# Patient Record
Sex: Male | Born: 1940
Health system: Southern US, Community
[De-identification: ages and names within clinical notes are randomized; demographics above are authoritative.]

## PROBLEM LIST (undated history)

## (undated) DIAGNOSIS — Z860100 Personal history of colon polyps, unspecified: Secondary | ICD-10-CM

## (undated) DIAGNOSIS — K5730 Diverticulosis of large intestine without perforation or abscess without bleeding: Secondary | ICD-10-CM

## (undated) DIAGNOSIS — Z973 Presence of spectacles and contact lenses: Secondary | ICD-10-CM

## (undated) DIAGNOSIS — E785 Hyperlipidemia, unspecified: Secondary | ICD-10-CM

## (undated) DIAGNOSIS — I2510 Atherosclerotic heart disease of native coronary artery without angina pectoris: Secondary | ICD-10-CM

## (undated) DIAGNOSIS — C61 Malignant neoplasm of prostate: Secondary | ICD-10-CM

## (undated) DIAGNOSIS — F5221 Male erectile disorder: Secondary | ICD-10-CM

## (undated) DIAGNOSIS — N403 Nodular prostate with lower urinary tract symptoms: Secondary | ICD-10-CM

## (undated) DIAGNOSIS — Z951 Presence of aortocoronary bypass graft: Secondary | ICD-10-CM

## (undated) DIAGNOSIS — I10 Essential (primary) hypertension: Secondary | ICD-10-CM

## (undated) HISTORY — PX: CORONARY ARTERY BYPASS GRAFT: SHX141

## (undated) HISTORY — DX: Male erectile disorder: F52.21

## (undated) HISTORY — DX: Essential (primary) hypertension: I10

## (undated) HISTORY — PX: CARDIAC CATHETERIZATION: SHX172

## (undated) HISTORY — DX: Atherosclerotic heart disease of native coronary artery without angina pectoris: I25.10

---

## 2004-08-27 ENCOUNTER — Ambulatory Visit: Payer: Self-pay | Admitting: Family Medicine

## 2004-09-03 ENCOUNTER — Ambulatory Visit: Payer: Self-pay | Admitting: Family Medicine

## 2004-12-03 ENCOUNTER — Ambulatory Visit: Payer: Self-pay | Admitting: Family Medicine

## 2005-01-07 ENCOUNTER — Ambulatory Visit: Payer: Self-pay | Admitting: Family Medicine

## 2005-04-29 ENCOUNTER — Ambulatory Visit: Payer: Self-pay | Admitting: Family Medicine

## 2005-04-29 ENCOUNTER — Inpatient Hospital Stay (HOSPITAL_COMMUNITY): Admission: EM | Admit: 2005-04-29 | Discharge: 2005-05-09 | Payer: Self-pay | Admitting: *Deleted

## 2005-04-29 ENCOUNTER — Ambulatory Visit: Payer: Self-pay | Admitting: Cardiology

## 2005-05-02 DIAGNOSIS — Z951 Presence of aortocoronary bypass graft: Secondary | ICD-10-CM

## 2005-05-02 HISTORY — DX: Presence of aortocoronary bypass graft: Z95.1

## 2005-05-10 ENCOUNTER — Ambulatory Visit: Payer: Self-pay | Admitting: Cardiology

## 2005-05-16 ENCOUNTER — Ambulatory Visit: Payer: Self-pay | Admitting: Cardiology

## 2005-05-23 ENCOUNTER — Ambulatory Visit: Payer: Self-pay | Admitting: Cardiovascular Disease

## 2005-05-23 ENCOUNTER — Ambulatory Visit: Payer: Self-pay | Admitting: Cardiology

## 2005-06-02 ENCOUNTER — Encounter (HOSPITAL_COMMUNITY): Admission: RE | Admit: 2005-06-02 | Discharge: 2005-08-31 | Payer: Self-pay | Admitting: Cardiovascular Disease

## 2005-07-01 ENCOUNTER — Ambulatory Visit: Payer: Self-pay | Admitting: Cardiovascular Disease

## 2005-11-04 ENCOUNTER — Ambulatory Visit: Payer: Self-pay | Admitting: Cardiovascular Disease

## 2006-02-17 ENCOUNTER — Ambulatory Visit: Payer: Self-pay | Admitting: Family Medicine

## 2006-02-24 ENCOUNTER — Ambulatory Visit: Payer: Self-pay | Admitting: Family Medicine

## 2006-05-26 ENCOUNTER — Ambulatory Visit: Payer: Self-pay | Admitting: Cardiovascular Disease

## 2007-01-26 ENCOUNTER — Ambulatory Visit: Payer: Self-pay | Admitting: Cardiovascular Disease

## 2007-04-06 ENCOUNTER — Ambulatory Visit: Payer: Self-pay | Admitting: Family Medicine

## 2007-04-09 LAB — CONVERTED CEMR LAB
ALT: 30 units/L (ref 0–53)
AST: 28 units/L (ref 0–37)
Albumin: 4.4 g/dL (ref 3.5–5.2)
Alkaline Phosphatase: 55 units/L (ref 39–117)
BUN: 14 mg/dL (ref 6–23)
Basophils Absolute: 0.1 10*3/uL (ref 0.0–0.1)
Basophils Relative: 0.7 % (ref 0.0–1.0)
Bilirubin, Direct: 0.2 mg/dL (ref 0.0–0.3)
CO2: 33 meq/L — ABNORMAL HIGH (ref 19–32)
Calcium: 9.8 mg/dL (ref 8.4–10.5)
Chloride: 101 meq/L (ref 96–112)
Cholesterol: 164 mg/dL (ref 0–200)
Creatinine, Ser: 0.8 mg/dL (ref 0.4–1.5)
Eosinophils Absolute: 0.3 10*3/uL (ref 0.0–0.6)
Eosinophils Relative: 4.4 % (ref 0.0–5.0)
GFR calc Af Amer: 125 mL/min
GFR calc non Af Amer: 103 mL/min
Glucose, Bld: 117 mg/dL — ABNORMAL HIGH (ref 70–99)
HCT: 47.3 % (ref 39.0–52.0)
HDL: 29.7 mg/dL — ABNORMAL LOW (ref >39.0)
Hemoglobin: 16.5 g/dL (ref 13.0–17.0)
LDL Cholesterol: 115 mg/dL — ABNORMAL HIGH (ref 0–99)
Lymphocytes Relative: 20.1 % (ref 12.0–46.0)
MCHC: 34.8 g/dL (ref 30.0–36.0)
MCV: 94.6 fL (ref 78.0–100.0)
Monocytes Absolute: 0.6 10*3/uL (ref 0.2–0.7)
Monocytes Relative: 7.2 % (ref 3.0–11.0)
Neutro Abs: 5.2 10*3/uL (ref 1.4–7.7)
Neutrophils Relative %: 67.6 % (ref 43.0–77.0)
PSA: 0.52 ng/mL (ref 0.10–4.00)
Platelets: 204 10*3/uL (ref 150–400)
Potassium: 3.9 meq/L (ref 3.5–5.1)
RBC: 5 M/uL (ref 4.22–5.81)
RDW: 12.1 % (ref 11.5–14.6)
Sodium: 141 meq/L (ref 135–145)
TSH: 1.79 microintl units/mL (ref 0.35–5.50)
Total Bilirubin: 0.7 mg/dL (ref 0.3–1.2)
Total CHOL/HDL Ratio: 5.5
Total Protein: 7 g/dL (ref 6.0–8.3)
Triglycerides: 98 mg/dL (ref 0–149)
VLDL: 20 mg/dL (ref 0–40)
WBC: 7.7 10*3/uL (ref 4.5–10.5)

## 2007-04-13 ENCOUNTER — Ambulatory Visit: Payer: Self-pay | Admitting: Family Medicine

## 2007-04-13 DIAGNOSIS — E785 Hyperlipidemia, unspecified: Secondary | ICD-10-CM

## 2007-04-13 DIAGNOSIS — I2510 Atherosclerotic heart disease of native coronary artery without angina pectoris: Secondary | ICD-10-CM

## 2007-04-13 DIAGNOSIS — I10 Essential (primary) hypertension: Secondary | ICD-10-CM

## 2007-06-07 LAB — HM COLONOSCOPY

## 2007-07-14 LAB — HM COLONOSCOPY

## 2007-07-20 ENCOUNTER — Ambulatory Visit: Payer: Self-pay | Admitting: Gastroenterology

## 2007-08-03 ENCOUNTER — Encounter: Payer: Self-pay | Admitting: Family Medicine

## 2007-08-03 ENCOUNTER — Encounter: Payer: Self-pay | Admitting: Gastroenterology

## 2007-08-03 ENCOUNTER — Ambulatory Visit: Payer: Self-pay | Admitting: Gastroenterology

## 2007-08-03 HISTORY — PX: COLONOSCOPY: SHX174

## 2008-02-22 ENCOUNTER — Ambulatory Visit: Payer: Self-pay | Admitting: Cardiovascular Disease

## 2008-04-24 ENCOUNTER — Ambulatory Visit: Payer: Self-pay | Admitting: Family Medicine

## 2008-04-24 DIAGNOSIS — N138 Other obstructive and reflux uropathy: Secondary | ICD-10-CM

## 2008-04-24 DIAGNOSIS — F528 Other sexual dysfunction not due to a substance or known physiological condition: Secondary | ICD-10-CM

## 2008-04-24 DIAGNOSIS — N401 Enlarged prostate with lower urinary tract symptoms: Secondary | ICD-10-CM

## 2008-11-07 ENCOUNTER — Encounter (INDEPENDENT_AMBULATORY_CARE_PROVIDER_SITE_OTHER): Payer: Self-pay | Admitting: *Deleted

## 2009-01-08 DIAGNOSIS — E669 Obesity, unspecified: Secondary | ICD-10-CM

## 2009-01-08 DIAGNOSIS — R609 Edema, unspecified: Secondary | ICD-10-CM

## 2009-01-09 ENCOUNTER — Ambulatory Visit: Payer: Self-pay | Admitting: Cardiovascular Disease

## 2009-07-10 ENCOUNTER — Ambulatory Visit: Payer: Self-pay | Admitting: Family Medicine

## 2009-07-10 ENCOUNTER — Encounter: Payer: Self-pay | Admitting: Cardiovascular Disease

## 2009-11-16 ENCOUNTER — Telehealth: Payer: Self-pay | Admitting: Family Medicine

## 2009-11-23 ENCOUNTER — Encounter (INDEPENDENT_AMBULATORY_CARE_PROVIDER_SITE_OTHER): Payer: Self-pay | Admitting: *Deleted

## 2010-02-05 ENCOUNTER — Ambulatory Visit: Payer: Self-pay | Admitting: Cardiovascular Disease

## 2010-02-05 DIAGNOSIS — R109 Unspecified abdominal pain: Secondary | ICD-10-CM

## 2010-06-11 ENCOUNTER — Ambulatory Visit: Admission: RE | Admit: 2010-06-11 | Discharge: 2010-06-11 | Payer: Self-pay | Source: Home / Self Care

## 2010-06-11 ENCOUNTER — Encounter: Payer: Self-pay | Admitting: Cardiovascular Disease

## 2010-07-04 LAB — CONVERTED CEMR LAB
ALT: 33 units/L (ref 0–53)
ALT: 33 units/L (ref 0–53)
AST: 29 units/L (ref 0–37)
AST: 32 units/L (ref 0–37)
Albumin: 4.2 g/dL (ref 3.5–5.2)
Albumin: 4.5 g/dL (ref 3.5–5.2)
Alkaline Phosphatase: 48 units/L (ref 39–117)
Alkaline Phosphatase: 50 units/L (ref 39–117)
BUN: 16 mg/dL (ref 6–23)
BUN: 17 mg/dL (ref 6–23)
Basophils Absolute: 0 10*3/uL (ref 0.0–0.1)
Basophils Absolute: 0 10*3/uL (ref 0.0–0.1)
Basophils Relative: 0.2 % (ref 0.0–3.0)
Basophils Relative: 0.6 % (ref 0.0–3.0)
Bilirubin, Direct: 0.1 mg/dL (ref 0.0–0.3)
Bilirubin, Direct: 0.2 mg/dL (ref 0.0–0.3)
CO2: 30 meq/L (ref 19–32)
CO2: 32 meq/L (ref 19–32)
Calcium: 9.5 mg/dL (ref 8.4–10.5)
Calcium: 9.8 mg/dL (ref 8.4–10.5)
Chloride: 103 meq/L (ref 96–112)
Chloride: 99 meq/L (ref 96–112)
Cholesterol: 125 mg/dL (ref 0–200)
Cholesterol: 151 mg/dL (ref 0–200)
Creatinine, Ser: 0.9 mg/dL (ref 0.4–1.5)
Creatinine, Ser: 1 mg/dL (ref 0.4–1.5)
Eosinophils Absolute: 0.2 10*3/uL (ref 0.0–0.7)
Eosinophils Absolute: 0.3 10*3/uL (ref 0.0–0.7)
Eosinophils Relative: 2.6 % (ref 0.0–5.0)
Eosinophils Relative: 5 % (ref 0.0–5.0)
GFR calc Af Amer: 109 mL/min
GFR calc non Af Amer: 79.28 mL/min (ref >60)
GFR calc non Af Amer: 90 mL/min
Glucose, Bld: 103 mg/dL — ABNORMAL HIGH (ref 70–99)
Glucose, Bld: 109 mg/dL — ABNORMAL HIGH (ref 70–99)
HCT: 45.9 % (ref 39.0–52.0)
HCT: 48.1 % (ref 39.0–52.0)
HDL: 35.1 mg/dL — ABNORMAL LOW (ref >39.0)
HDL: 38.2 mg/dL — ABNORMAL LOW (ref >39.00)
Hemoglobin: 15.4 g/dL (ref 13.0–17.0)
Hemoglobin: 16.5 g/dL (ref 13.0–17.0)
LDL Cholesterol: 71 mg/dL (ref 0–99)
LDL Cholesterol: 92 mg/dL (ref 0–99)
Lymphocytes Relative: 13 % (ref 12.0–46.0)
Lymphocytes Relative: 19.1 % (ref 12.0–46.0)
Lymphs Abs: 1.2 10*3/uL (ref 0.7–4.0)
MCHC: 33.6 g/dL (ref 30.0–36.0)
MCHC: 34.3 g/dL (ref 30.0–36.0)
MCV: 96.3 fL (ref 78.0–100.0)
MCV: 96.4 fL (ref 78.0–100.0)
Monocytes Absolute: 0.5 10*3/uL (ref 0.1–1.0)
Monocytes Absolute: 0.5 10*3/uL (ref 0.1–1.0)
Monocytes Relative: 6.1 % (ref 3.0–12.0)
Monocytes Relative: 6.8 % (ref 3.0–12.0)
Neutro Abs: 4.6 10*3/uL (ref 1.4–7.7)
Neutro Abs: 7 10*3/uL (ref 1.4–7.7)
Neutrophils Relative %: 68.5 % (ref 43.0–77.0)
Neutrophils Relative %: 78.1 % — ABNORMAL HIGH (ref 43.0–77.0)
PSA: 1.65 ng/mL (ref 0.10–4.00)
PSA: 1.93 ng/mL (ref 0.10–4.00)
Platelets: 187 10*3/uL (ref 150.0–400.0)
Platelets: 188 10*3/uL (ref 150–400)
Potassium: 3.6 meq/L (ref 3.5–5.1)
Potassium: 3.7 meq/L (ref 3.5–5.1)
RBC: 4.76 M/uL (ref 4.22–5.81)
RBC: 5 M/uL (ref 4.22–5.81)
RDW: 12.2 % (ref 11.5–14.6)
RDW: 12.4 % (ref 11.5–14.6)
Sodium: 141 meq/L (ref 135–145)
Sodium: 143 meq/L (ref 135–145)
TSH: 1.83 microintl units/mL (ref 0.35–5.50)
TSH: 2.13 microintl units/mL (ref 0.35–5.50)
Total Bilirubin: 0.7 mg/dL (ref 0.3–1.2)
Total Bilirubin: 0.9 mg/dL (ref 0.3–1.2)
Total CHOL/HDL Ratio: 3
Total CHOL/HDL Ratio: 4.3
Total Protein: 7.3 g/dL (ref 6.0–8.3)
Total Protein: 7.4 g/dL (ref 6.0–8.3)
Triglycerides: 119 mg/dL (ref 0–149)
Triglycerides: 79 mg/dL (ref 0.0–149.0)
VLDL: 15.8 mg/dL (ref 0.0–40.0)
VLDL: 24 mg/dL (ref 0–40)
WBC: 6.7 10*3/uL (ref 4.5–10.5)
WBC: 8.9 10*3/uL (ref 4.5–10.5)

## 2010-07-06 NOTE — Progress Notes (Signed)
Summary: Change benicar to cozaar to save pt money  Phone Note From Pharmacy   [facility]  [address] [facility] Summary of Call: [facility] Most [facility] called and has request a sub for Benicar to Cozaar to save the pt some money. Can we do this? Initial call taken by: Romualdo Bolk, CMA Duncan Dull),  November 16, 2009 8:54 AM  Follow-up for Phone Call        switch to Losartan 50 mg once daily , #30 with 11 rf Follow-up by: Nelwyn Salisbury MD,  November 18, 2009 1:00 PM    New/Updated Medications: LOSARTAN POTASSIUM 50 MG TABS (LOSARTAN POTASSIUM) 1 once daily Prescriptions: LOSARTAN POTASSIUM 50 MG TABS (LOSARTAN POTASSIUM) 1 once daily  #30 x 11   Entered by:   Raechel Ache, RN   Authorized by:   Nelwyn Salisbury MD   Signed by:   Raechel Ache, RN on 11/18/2009   Method used:   [facility]        [facility]  [address] (retail)       [address]       [place], [place]  [address]       Ph: [phone] or [phone]       Fax: [phone]   RxID:   [id number]

## 2010-07-06 NOTE — Letter (Signed)
Summary: Appointment - Reminder 2  [facility], Main Office  [address]   [place], [place] [address]   Phone: [phone]  Fax: [phone]     November 23, 2009 MRN: [id number]   Robert [name] [address] [place], [place]  [address]   Dear [name],  Our records indicate that it is time to schedule a follow-up appointment with Dr. Eden Emms in August. It is very important that we reach you to schedule this appointment. We look forward to participating in your health care needs. Please contact us at the number listed above at your earliest convenience to schedule your appointment.  If you are unable to make an appointment at this time, give [place] a call so we can update our records.     Sincerely,   Migdalia Dk [facility] Scheduling Team

## 2010-07-06 NOTE — Assessment & Plan Note (Signed)
Summary: F1Y/DM  Medications Added BENICAR 20 MG TABS (OLMESARTAN MEDOXOMIL) Take one tablet by mouth daily      Allergies Added: NKDA  Visit Type:  1 year follow up  CC:  No cardiac complains.  History of Present Illness: Robert [name] is seen today in followup for his coronary artery disease hypertension and hyperlipidemia.  He is status post coronary artery bypass surgery in 2006.  He has good LV function.  His risk factors are well modified.  He has been compliant with his medications.  He is not having any significant chest pain.  He had a two-week vacation in [place] for his 45th anniversary.  He continues to be somewhat overweight.  We talked about low-carb diet.  He normally gets his EKG and lab work with Dr. Clent Ridges.  Otherwise he's not had any significant chest pain diaphoresis exertional dyspnea PND or orthopnea.  He continues to work 4 days a week.  He has been having some abdominal/lower back pain with prolonged standing.  Given his vascular disease he should have AAA R/O  Current Problems (verified): 1)  Hyperlipidemia  (ICD-272.4) 2)  Hypertension  (ICD-401.9) 3)  Coronary Artery Disease  (ICD-414.00) 4)  Obesity  (ICD-278.00) 5)  Benign Prostatic Hypertrophy, With Obstruction  (ICD-600.01) 6)  Erectile Dysfunction  (ICD-302.72) 7)  Routine General Medical Exam@health  Care Facl  (ICD-V70.0) 8)  Edema  (ICD-782.3)  Current Medications (verified): 1)  Atenolol-Chlorthalidone 50-25 Mg Tabs (Atenolol-Chlorthalidone) .Marland Kitchen.. 1 By Mouth Once Daily 2)  Aspir-Low 81 Mg Tbec (Aspirin) .Marland Kitchen.. 1 Once Daily Pc 3)  Zocor 80 Mg  Tabs (Simvastatin) .... Once Daily 4)  Cialis 20 Mg Tabs (Tadalafil) .... As Needed 5)  Benicar 20 Mg Tabs (Olmesartan Medoxomil) .... Take One Tablet By Mouth Daily  Allergies (verified): No Known Drug Allergies  Past History:  Past Medical History: Last updated: 07/10/2009 Current Problems:  HYPERLIPIDEMIA (ICD-272.4) HYPERTENSION (ICD-401.9) CORONARY ARTERY  DISEASE (ICD-414.00)  sees Dr. Eden Emms OBESITY (ICD-278.00) BENIGN PROSTATIC HYPERTROPHY, WITH OBSTRUCTION (ICD-600.01) ERECTILE DYSFUNCTION (ICD-302.72) EDEMA (ICD-782.3) ED  Past Surgical History: Last updated: 07/10/2009 Triple bypass surgery 11-06 per Dr. Zenaida Niece Trigt colonoscopy 08-03-07 per Dr. Russella Dar, repeat in 10 yrs  LIMA to the LAD, a radial graft to the circ, and the vein  graft to the diagonal.  Family History: Last updated: 04/13/2007 Family History of Sudden Death  Social History: Last updated: 04/13/2007 Married Never Smoked Alcohol use-no Drug use-no  Review of Systems       Denies fever, malais, weight loss, blurry vision, decreased visual acuity, cough, sputum, SOB, hemoptysis, pleuritic pain, palpitaitons, heartburn, abdominal pain, melena, lower extremity edema, claudication, or rash.   Vital Signs:  Patient profile:   70 year old male Height:      67 inches Weight:      218.25 pounds BMI:     34.31 Pulse rate:   68 / minute Pulse rhythm:   regular Resp:     18 per minute BP sitting:   138 / 70  (left arm) Cuff size:   large  Vitals Entered By: Vikki Ports (February 05, 2010 9:50 AM)  Physical Exam  General:  Affect appropriate Healthy:  appears stated age HEENT: normal Neck supple with no adenopathy JVP normal no bruits no thyromegaly Lungs clear with no wheezing and good diaphragmatic motion Heart:  S1/S2 no murmur,rub, gallop or click PMI normal Abdomen: benighn, BS positve, no tenderness, no AAA no bruit.  No HSM or HJR Distal pulses intact with  no bruits No edema Neuro non-focal Skin warm and dry    Impression & Recommendations:  Problem # 1:  HYPERLIPIDEMIA (ICD-272.4) At goal but recent warning on liver toxicity with high dose zocor.  He has not tolerated crestor in the past and wants to stay on zocor He only takes it every other day.  F/U LFT's q 6 months    Zocor 80 Mg Tabs (Simvastatin) ..... Once daily  CHOL: 125  (07/10/2009)   LDL: 71 (07/10/2009)   HDL: 38.20 (07/10/2009)   TG: 79.0 (07/10/2009)  Problem # 2:  HYPERTENSION (ICD-401.9) Well controlled  Has not started generic losartan yet The following medications were removed from the medication list:    Losartan Potassium 50 Mg Tabs (Losartan potassium) .Marland Kitchen... 1 once daily His updated medication list for this problem includes:    Atenolol-chlorthalidone 50-25 Mg Tabs (Atenolol-chlorthalidone) .Marland Kitchen... 1 by mouth once daily    Aspir-low 81 Mg Tbec (Aspirin) .Marland Kitchen... 1 once daily pc    Benicar 20 Mg Tabs (Olmesartan medoxomil) .Marland Kitchen... Take one tablet by mouth daily  Problem # 3:  CORONARY ARTERY DISEASE (ICD-414.00) CABG in 2006  no angina.  Continue ASA and Bb His updated medication list for this problem includes:    Atenolol-chlorthalidone 50-25 Mg Tabs (Atenolol-chlorthalidone) .Marland Kitchen... 1 by mouth once daily    Aspir-low 81 Mg Tbec (Aspirin) .Marland Kitchen... 1 once daily pc  Orders: Abdominal Aorta Duplex (Abd Aorta Duplex)  Problem # 4:  OBESITY (ICD-278.00) Discussed low carb [place] type diet  Problem # 5:  ABDOMINAL PAIN OTHER SPECIFIED SITE (ICD-789.09) R/O AAA with [place] given history of vascular disese.  No palbable abdominal mass.  May be lower spine/back in nature  Patient Instructions: 1)  Your physician recommends that you schedule a follow-up appointment in: 1 year 2)  Your physician has requested that you have an abdominal aorta duplex. During this test, an ultrasound is used to evaluate the aorta. Allow 30 minutes for this exam. Do not eat after midnight the day before and avoid carbonated beverages. There are no restrictions or special instructions.

## 2010-07-06 NOTE — Assessment & Plan Note (Signed)
Summary: pt will come in fasting/njr   Vital Signs:  Patient profile:   70 year old Robert [name] Height:      67 inches Weight:      224.5 pounds Temp:     98.1 degrees F oral Pulse rate:   107 / minute BP sitting:   146 / 80  (left arm) Cuff size:   large  Vitals Entered By: Alfred Levins, CMA (July 10, 2009 8:51 AM) CC: cpx, fasting   History of Present Illness: 70 yr old Robert [name] for cpx. Feels good, no concerns. He gets little exercise, but he and his wife recently joined [employer], and he has lost 5 lbs.   Preventive Screening-Counseling & Management  Alcohol-Tobacco     Smoking Status: never  Current Medications (verified): 1)  Atenolol-Chlorthalidone 50-25 Mg Tabs (Atenolol-Chlorthalidone) .Marland Kitchen.. 1 By Mouth Once Daily 2)  Aspir-Low 81 Mg Tbec (Aspirin) .Marland Kitchen.. 1 Once Daily Pc 3)  Zocor 80 Mg  Tabs (Simvastatin) .... Once Daily 4)  Benicar 20 Mg  Tabs (Olmesartan Medoxomil) .... Once Daily Needs Ov 5)  Cialis 20 Mg Tabs (Tadalafil) .... As Needed  Allergies (verified): No Known Drug Allergies  Past History:  Past Medical History: Current Problems:  HYPERLIPIDEMIA (ICD-272.4) HYPERTENSION (ICD-401.9) CORONARY ARTERY DISEASE (ICD-414.00)  sees Dr. Eden Emms OBESITY (ICD-278.00) BENIGN PROSTATIC HYPERTROPHY, WITH OBSTRUCTION (ICD-600.01) ERECTILE DYSFUNCTION (ICD-302.72) EDEMA (ICD-782.3) ED  Past Surgical History: Triple bypass surgery 11-06 per Dr. Donata Clay colonoscopy 08-03-07 per Dr. Russella Dar, repeat in 10 yrs  LIMA to the LAD, a radial graft to the circ, and the vein  graft to the diagonal.  Family History: Reviewed history from 04/13/2007 and no changes required. Family History of Sudden Death  Social History: Reviewed history from 04/13/2007 and no changes required. Married Never Smoked Alcohol use-no Drug use-no  Review of Systems  The patient denies anorexia, fever, weight loss, weight gain, vision loss, decreased hearing, hoarseness, chest pain,  syncope, dyspnea on exertion, peripheral edema, prolonged cough, headaches, hemoptysis, abdominal pain, melena, hematochezia, severe indigestion/heartburn, hematuria, incontinence, genital sores, muscle weakness, suspicious skin lesions, transient blindness, difficulty walking, depression, unusual weight change, abnormal bleeding, enlarged lymph nodes, angioedema, breast masses, and testicular masses.    Physical Exam  General:  overweight-appearing.   Head:  Normocephalic and atraumatic without obvious abnormalities. No apparent alopecia or balding. Eyes:  No corneal or conjunctival inflammation noted. EOMI. Perrla. Funduscopic exam benign, without hemorrhages, exudates or papilledema. Vision grossly normal. Ears:  External ear exam shows no significant lesions or deformities.  Otoscopic examination reveals clear canals, tympanic membranes are intact bilaterally without bulging, retraction, inflammation or discharge. Hearing is grossly normal bilaterally. Nose:  External nasal examination shows no deformity or inflammation. Nasal mucosa are pink and moist without lesions or exudates. Mouth:  Oral mucosa and oropharynx without lesions or exudates.  Teeth in good repair. Neck:  No deformities, masses, or tenderness noted. Chest Wall:  No deformities, masses, tenderness or gynecomastia noted. Lungs:  Normal respiratory effort, chest expands symmetrically. Lungs are clear to auscultation, no crackles or wheezes. Heart:  Normal rate and regular rhythm. S1 and S2 normal without gallop, murmur, click, rub or other extra sounds. EKG is at his baseline  Abdomen:  Bowel sounds positive,abdomen soft and non-tender without masses, organomegaly or hernias noted. Rectal:  No external abnormalities noted. Normal sphincter tone. No rectal masses or tenderness. Heme neg. Genitalia:  Testes bilaterally descended without nodularity, tenderness or masses. No scrotal masses or lesions. No penis  lesions or urethral  discharge. Prostate:  no nodules, no asymmetry, no induration, and 1+ enlarged.   Msk:  No deformity or scoliosis noted of thoracic or lumbar spine.   Pulses:  R and L carotid,radial,femoral,dorsalis pedis and posterior tibial pulses are full and equal bilaterally Extremities:  No clubbing, cyanosis, edema, or deformity noted with normal full range of motion of all joints.   Neurologic:  No cranial nerve deficits noted. Station and gait are normal. Plantar reflexes are down-going bilaterally. DTRs are symmetrical throughout. Sensory, motor and coordinative functions appear intact. Skin:  Intact without suspicious lesions or rashes Cervical Nodes:  No lymphadenopathy noted Axillary Nodes:  No palpable lymphadenopathy Inguinal Nodes:  No significant adenopathy Psych:  Cognition and judgment appear intact. Alert and cooperative with normal attention span and concentration. No apparent delusions, illusions, hallucinations   Impression & Recommendations:  Problem # 1:  HYPERLIPIDEMIA (ICD-272.4)  His updated medication list for this problem includes:    Zocor 80 Mg Tabs (Simvastatin) ..... Once daily  Problem # 2:  HYPERTENSION (ICD-401.9)  His updated medication list for this problem includes:    Atenolol-chlorthalidone 50-25 Mg Tabs (Atenolol-chlorthalidone) .Marland Kitchen... 1 by mouth once daily    Benicar 20 Mg Tabs (Olmesartan medoxomil) ..... Once daily  Orders: UA Dipstick w/o Micro (automated)  (81003) Venipuncture (16109) TLB-Lipid Panel (80061-LIPID) TLB-BMP (Basic Metabolic Panel-BMET) (80048-METABOL) TLB-CBC Platelet - w/Differential (85025-CBCD) TLB-Hepatic/Liver Function Pnl (80076-HEPATIC) TLB-TSH (Thyroid Stimulating Hormone) (84443-TSH)  Problem # 3:  CORONARY ARTERY DISEASE (ICD-414.00)  His updated medication list for this problem includes:    Atenolol-chlorthalidone 50-25 Mg Tabs (Atenolol-chlorthalidone) .Marland Kitchen... 1 by mouth once daily    Aspir-low 81 Mg Tbec (Aspirin) .Marland Kitchen... 1  once daily pc    Benicar 20 Mg Tabs (Olmesartan medoxomil) ..... Once daily  Orders: EKG w/ Interpretation (93000)  Problem # 4:  BENIGN PROSTATIC HYPERTROPHY, WITH OBSTRUCTION (ICD-600.01)  Orders: TLB-PSA (Prostate Specific Antigen) (84153-PSA)  Problem # 5:  ERECTILE DYSFUNCTION (ICD-302.72)  His updated medication list for this problem includes:    Cialis 20 Mg Tabs (Tadalafil) .Marland Kitchen... As needed  Complete Medication List: 1)  Atenolol-chlorthalidone 50-25 Mg Tabs (Atenolol-chlorthalidone) .Marland Kitchen.. 1 by mouth once daily 2)  Aspir-low 81 Mg Tbec (Aspirin) .Marland Kitchen.. 1 once daily pc 3)  Zocor 80 Mg Tabs (Simvastatin) .... Once daily 4)  Benicar 20 Mg Tabs (Olmesartan medoxomil) .... Once daily 5)  Cialis 20 Mg Tabs (Tadalafil) .... As needed  Patient Instructions: 1)  It is important that you exercise reguarly at least 20 minutes 5 times a week. If you develop chest pain, have severe difficulty breathing, or feel very tired, stop exercising immediately and seek medical attention.  2)  You need to lose weight. Consider a lower calorie diet and regular exercise.  3)  get labs today Prescriptions: CIALIS 20 MG TABS (TADALAFIL) as needed  #30 x 3   Entered and Authorized by:   Nelwyn Salisbury MD   Signed by:   Nelwyn Salisbury MD on 07/10/2009   Method used:   Print then Give to Patient   RxID:   [id number] BENICAR 20 MG  TABS (OLMESARTAN MEDOXOMIL) once daily  #90 x 3   Entered and Authorized by:   Nelwyn Salisbury MD   Signed by:   Nelwyn Salisbury MD on 07/10/2009   Method used:   Print then Give to Patient   RxID:   [id number] ZOCOR 80 MG  TABS (SIMVASTATIN) once daily  #90 x  3   Entered and Authorized by:   Nelwyn Salisbury MD   Signed by:   Nelwyn Salisbury MD on 07/10/2009   Method used:   Print then Give to Patient   RxID:   [id number] ATENOLOL-CHLORTHALIDONE 50-25 MG TABS (ATENOLOL-CHLORTHALIDONE) 1 by mouth once daily  #90 x 3   Entered and Authorized by:   Nelwyn Salisbury MD    Signed by:   Nelwyn Salisbury MD on 07/10/2009   Method used:   Print then Give to Patient   RxID:   [id number]   Preventive Care Screening  Colonoscopy:    Date:  06/07/2007    Next Due:  06/2017    Results:  normal   Last Flu Shot:    Date:  03/06/2009    Results:  given      Appended Document: pt will come in fasting/njr  Laboratory Results   Urine Tests    Routine Urinalysis   Color: yellow Appearance: Clear Glucose: negative   (Normal Range: Negative) Bilirubin: 1+   (Normal Range: Negative) Ketone: 1+   (Normal Range: Negative) Spec. Gravity: 1.015   (Normal Range: 1.003-1.035) Blood: negative   (Normal Range: Negative) pH: 6.0   (Normal Range: 5.0-8.0) Protein: 1+   (Normal Range: Negative) Urobilinogen: 1.0   (Normal Range: 0-1) Nitrite: negative   (Normal Range: Negative) Leukocyte Esterace: negative   (Normal Range: Negative)    Comments: Rita Ohara  July 10, 2009 10:51 AM

## 2010-07-16 ENCOUNTER — Encounter: Payer: Self-pay | Admitting: Family Medicine

## 2010-08-13 ENCOUNTER — Encounter: Payer: Self-pay | Admitting: Family Medicine

## 2010-08-13 ENCOUNTER — Ambulatory Visit (INDEPENDENT_AMBULATORY_CARE_PROVIDER_SITE_OTHER): Payer: Medicare Other | Admitting: Family Medicine

## 2010-08-13 VITALS — BP 150/90 | HR 88 | Temp 98.4°F | Ht 66.0 in | Wt 216.0 lb

## 2010-08-13 DIAGNOSIS — N401 Enlarged prostate with lower urinary tract symptoms: Secondary | ICD-10-CM

## 2010-08-13 DIAGNOSIS — Z23 Encounter for immunization: Secondary | ICD-10-CM

## 2010-08-13 DIAGNOSIS — N139 Obstructive and reflux uropathy, unspecified: Secondary | ICD-10-CM

## 2010-08-13 DIAGNOSIS — Z0000 Encounter for general adult medical examination without abnormal findings: Secondary | ICD-10-CM

## 2010-08-13 DIAGNOSIS — I10 Essential (primary) hypertension: Secondary | ICD-10-CM

## 2010-08-13 LAB — LIPID PANEL
Cholesterol: 131 mg/dL (ref 0–200)
HDL: 33.7 mg/dL — ABNORMAL LOW (ref >39.00)
LDL Cholesterol: 84 mg/dL (ref 0–99)
Total CHOL/HDL Ratio: 4
Triglycerides: 65 mg/dL (ref 0.0–149.0)

## 2010-08-13 LAB — POCT URINALYSIS DIPSTICK
Bilirubin, UA: NEGATIVE
Glucose, UA: NEGATIVE
Ketones, UA: NEGATIVE
Leukocytes, UA: NEGATIVE
Nitrite, UA: NEGATIVE
Spec Grav, UA: 1.02
Urobilinogen, UA: 1
pH, UA: 6.5

## 2010-08-13 LAB — CBC WITH DIFFERENTIAL/PLATELET
Basophils Absolute: 0 10*3/uL (ref 0.0–0.1)
Basophils Relative: 0.5 % (ref 0.0–3.0)
Eosinophils Relative: 4.1 % (ref 0.0–5.0)
HCT: 45.6 % (ref 39.0–52.0)
Hemoglobin: 15.4 g/dL (ref 13.0–17.0)
Lymphs Abs: 1.4 10*3/uL (ref 0.7–4.0)
MCHC: 33.8 g/dL (ref 30.0–36.0)
MCV: 95.4 fl (ref 78.0–100.0)
Monocytes Absolute: 0.4 10*3/uL (ref 0.1–1.0)
Monocytes Relative: 6.1 % (ref 3.0–12.0)
Neutro Abs: 4.4 10*3/uL (ref 1.4–7.7)
Neutrophils Relative %: 67.9 % (ref 43.0–77.0)
Platelets: 177 10*3/uL (ref 150.0–400.0)
RDW: 13.4 % (ref 11.5–14.6)
WBC: 6.5 10*3/uL (ref 4.5–10.5)

## 2010-08-13 LAB — BASIC METABOLIC PANEL
BUN: 18 mg/dL (ref 6–23)
CO2: 31 mEq/L (ref 19–32)
Calcium: 9.5 mg/dL (ref 8.4–10.5)
Chloride: 102 mEq/L (ref 96–112)
Creatinine, Ser: 0.8 mg/dL (ref 0.4–1.5)
Glucose, Bld: 107 mg/dL — ABNORMAL HIGH (ref 70–99)
Potassium: 4.2 mEq/L (ref 3.5–5.1)
Sodium: 141 mEq/L (ref 135–145)

## 2010-08-13 LAB — HEPATIC FUNCTION PANEL
ALT: 27 U/L (ref 0–53)
AST: 26 U/L (ref 0–37)
Albumin: 4.4 g/dL (ref 3.5–5.2)
Alkaline Phosphatase: 52 U/L (ref 39–117)
Bilirubin, Direct: 0.2 mg/dL (ref 0.0–0.3)
Total Bilirubin: 0.5 mg/dL (ref 0.3–1.2)
Total Protein: 6.8 g/dL (ref 6.0–8.3)

## 2010-08-13 LAB — PSA: PSA: 1.63 ng/mL (ref 0.10–4.00)

## 2010-08-13 LAB — TSH: TSH: 1.95 u[IU]/mL (ref 0.35–5.50)

## 2010-08-13 MED ORDER — LOSARTAN POTASSIUM 100 MG PO TABS: 100.0000 mg | ORAL_TABLET | Freq: Every day | ORAL | Status: DC

## 2010-08-13 MED ORDER — ATENOLOL-CHLORTHALIDONE 50-25 MG PO TABS: 1.0000 | ORAL_TABLET | Freq: Every day | ORAL | Status: DC

## 2010-08-13 MED ORDER — TADALAFIL 20 MG PO TABS: 20.0000 mg | ORAL_TABLET | Freq: Every day | ORAL | Status: DC | PRN

## 2010-08-13 MED ORDER — SIMVASTATIN 80 MG PO TABS: 80.0000 mg | ORAL_TABLET | Freq: Every day | ORAL | Status: DC

## 2010-08-13 NOTE — Progress Notes (Signed)
  Subjective:    Patient ID: Robert [name], male    DOB: 01-20-1941, 70 y.o.   MRN: [id number]  HPI 70 yr old male for a cpx. He feels well in general. We recently switched from benicar to Losartan to save money, and his BP has gone up slightly to the 130-145 range systolic. He had an aortic US per Dr. Eden Emms on 06-11-10 which was normal.    Review of Systems  Constitutional: Negative.   HENT: Negative.   Eyes: Negative.   Respiratory: Negative.   Cardiovascular: Negative.   Gastrointestinal: Negative.   Genitourinary: Negative.   Musculoskeletal: Negative.   Skin: Negative.   Neurological: Negative.   Hematological: Negative.   Psychiatric/Behavioral: Negative.        Objective:   Physical Exam  Constitutional: He is oriented to person, place, and time. He appears well-developed and well-nourished. No distress.  HENT:  Head: Normocephalic and atraumatic.  Right Ear: External ear normal.  Left Ear: External ear normal.  Nose: Nose normal.  Mouth/Throat: Oropharynx is clear and moist. No oropharyngeal exudate.  Eyes: Conjunctivae and EOM are normal. Pupils are equal, round, and reactive to light. Right eye exhibits no discharge. Left eye exhibits no discharge. No scleral icterus.  Neck: Neck supple. No JVD present. No tracheal deviation present. No thyromegaly present.  Cardiovascular: Normal rate, regular rhythm, normal heart sounds and intact distal pulses.  Exam reveals no gallop and no friction rub.   No murmur heard.      EKG normal  Pulmonary/Chest: Effort normal and breath sounds normal. No respiratory distress. He has no wheezes. He has no rales. He exhibits no tenderness.  Abdominal: Soft. Bowel sounds are normal. He exhibits no distension and no mass. There is no tenderness. There is no rebound and no guarding.  Genitourinary: Rectum normal, prostate normal and penis normal. Guaiac negative stool. No penile tenderness.  Musculoskeletal: Normal range of motion. He  exhibits no edema and no tenderness.  Lymphadenopathy:    He has no cervical adenopathy.  Neurological: He is alert and oriented to person, place, and time. He has normal reflexes. No cranial nerve deficit. He exhibits normal muscle tone. Coordination normal.  Skin: Skin is warm and dry. No rash noted. He is not diaphoretic. No erythema. No pallor.  Psychiatric: He has a normal mood and affect. His behavior is normal. Judgment and thought content normal.          Assessment & Plan:  We will get fasting labs today. Increase Losartan to 100 mg .

## 2010-08-16 ENCOUNTER — Telehealth: Payer: Self-pay

## 2010-08-16 NOTE — Telephone Encounter (Signed)
Message copied by Madison Hickman on Mon Aug 16, 2010  8:41 AM ------      Message from: Dwaine Deter      Created: Mon Aug 16, 2010  8:28 AM       normal

## 2010-08-16 NOTE — Telephone Encounter (Signed)
Pt called lab results given .

## 2010-08-20 ENCOUNTER — Other Ambulatory Visit: Payer: Self-pay

## 2010-08-20 MED ORDER — ATENOLOL-CHLORTHALIDONE 50-25 MG PO TABS: 1.0000 | ORAL_TABLET | Freq: Every day | ORAL | Status: DC

## 2010-08-20 MED ORDER — SIMVASTATIN 80 MG PO TABS: 80.0000 mg | ORAL_TABLET | Freq: Every day | ORAL | Status: DC

## 2010-08-20 NOTE — Telephone Encounter (Signed)
Pt called stated [facility] Pt aware will resend  And pt to call back if not received.

## 2010-10-19 NOTE — Assessment & Plan Note (Signed)
[facility]                            CARDIOLOGY OFFICE NOTE   NAME:Robert [name], Robert [name]                    MRN:          [id number]  DATE:02/22/2008                            DOB:          02/01/1941    HISTORY OF PRESENT ILLNESS:  Robert [name] returns today for followup.  He is  doing fairly well.  He had CABG in 2006.   This included LIMA to the LAD, a radial graft to the circ, and the vein  graft to the diagonal.  He has normal LV function.  He needs to have a  followup lipid and liver profile with Dr. Clent Ridges during his annual  physical.  Unfortunately, he has gained about 11 pounds.  We talked  about this at length.  He needs to be increasingly physically active.  He seems to be trying to watch his diet some.  He does not eat late at  night, but could do a little bit better in regards to his carbohydrate  and sugar content.   He is a nonsmoker.  His risk factors is otherwise well modified.  He is  not having any significant chest pain, PND, or orthopnea.   REVIEW OF SYSTEMS:  Otherwise negative.   PHYSICAL EXAMINATION:  GENERAL:  Remarkable for an overweight white  male, in no distress.  He is 5 feet 7, weight is 231, blood pressure  140/80, pulse 77 and regular, respiratory rate 14, afebrile.  HEENT:  Unremarkable.  Carotids are normal without bruit, no  lymphadenopathy, no thyromegaly, no JVP elevation.  LUNGS:  Clear.  Good diaphragmatic motion.  No wheezing.  S1 and S2.  Normal heart sounds.  Sternotomy well-healed.  PMI normal.  ABDOMEN:  Benign.  Bowel sounds positive.  No AAA, no tenderness, no  bruit, no hepatosplenomegaly, no hepatojugular reflux, no tenderness, no  AAA.  EXTREMITIES:  Distal pulses are intact.  No edema.  NEURO:  Nonfocal.  SKIN:  Warm and dry.  MUSCULOSKELETAL:  No muscular weakness.   IMPRESSION:  1. Stable, stable status post coronary artery bypass graft.  Continue      aspirin and beta-blocker.  2.  Hypercholesterolemia.  Lipid and liver profile per Dr. Clent Ridges.      Continue statin therapy.  3. Hypertension, currently well controlled.  Continue low-salt diet      and Benicar.  4. Central obesity.  Decreased caloric intake.  Increase activity      program.  Target goal of 10 pound weight loss in 6 months.   MEDICATIONS:  1. Potassium 20 a day.  2. Atenolol/ HCTZ 50/25.  3. Benicar 20 a day.  4. Aspirin a day.  5. Simvastatin 80 a day.     Noralyn Pick. Eden Emms, MD, [facility]  Electronically Signed    PCN/MedQ  DD: 02/22/2008  DT: 02/22/2008  Job #: [id number]

## 2010-10-19 NOTE — Assessment & Plan Note (Signed)
[facility]                            CARDIOLOGY OFFICE NOTE   NAME:Schiavo, NTHONY LEFFERTS                    MRN:          [id number]  DATE:01/26/2007                            DOB:          [birth year]    Mr. Cordts is seen today in followup.  He is status post CABG May 02, 2005.  He had an internal mammary artery to the LAD, radial graft to  the circumflex, and a vein graft to the diagonal.   He has good LV function.  He has hypertension, hypercholesterolemia.  He  has been compliant with his medications up until the last week.  He  needs refills.  He is due to have a general physical exam with Dr. Clent Ridges  soon.  He needs his cholesterol and liver checked as well.   In talking to the patient, he has been active.  He has not had any  significant chest pain, PND, or orthopnea.  There has been no lower  extremity edema.   His daughter, apparently, is struggling some with hyperthyroidism, and  is receiving some radioactive iodine treatments.  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   Mr. Petrosino has been compliant with his simvastatin.  He has not had  any significant myalgias or side effects from it.   He has tried to watch the salt in his diet, and decrease his caloric  intake, as he continues to be overweight.  Looking through the chart, I  do not have his lipids.  December 2006 showed an LDL cholesterol of 95.  I do not see recent studies in the chart.   REVIEW OF SYSTEMS:  Otherwise negative.   MEDICATIONS:  Include:  1. Potassium 20 a day.  2. Atenolol/hydrochlorothiazide 50/25.  3. Benicar 20 a day.  4. Aspirin a day.  5. Simvastatin 40 a day.   EXAMINATION:  Remarkable for a weight of 219, which is stable.  Blood  pressure is 150/80.  Pulse 70 and regular.  Respiratory rate is 14.  He  is afebrile.  HEENT:  Normal.  Carotids normal without bruits.  There is no lymphadenopathy.  No  thyromegaly.  No JVP  elevation.  LUNGS:  Clear with no wheezing, and good diaphragmatic motion.  S1 and S2 with distant heart sounds.  PMI is not palpable.  Sternum is  well healed.  ABDOMEN:  Protuberant.  Bowel sounds positive.  There is no tenderness.  No hepatosplenomegaly.  No hepatojugular reflux.  No AAA.  No renal  bruits.  Distal pulses are intact with no edema.  Femorals are +3 bilaterally.  PTs +3.  NEUROLOGIC:  Nonfocal.  There is no muscular weakness.  SKIN:  Warm and dry.   IMPRESSION:  1. Status post coronary artery bypass graft with no recurrent chest      pain.  No need for stress test at this time.  Continue aspirin and      beta blocker.  2. Hypercholesterolemia.  No recent LDL cholesterol.  Check lipid and      liver profile.  I suspect he will be  well below 80, given his      simvastatin therapy.  3. History of lower extremity edema, particularly post vein stripping      for bypass.  Continue low-dose diuretic.  Elevate legs at the end      of the day.  No need for compression hose at this time.  4. Hypertension.  Currently borderline control.  Continue Benicar 20      mg.  Patient to monitor at home.  We can always increase his      Benicar to 40 a day.  5. Health maintenance.  The patient will have his routine blood work      done today.  I will send these to Dr. Clent Ridges.  He needs to have a PSA      and to have a general physical.  I believe this will be scheduled      in the next month or 2 over at [facility].   Overall, I think Ronie is doing well, and I will see him back in a year  so long as his lab work is okay.     Noralyn Pick. Eden Emms, MD, [facility]  Electronically Signed    PCN/MedQ  DD: 01/26/2007  DT: 01/27/2007  Job #: [id number]

## 2010-10-22 NOTE — Assessment & Plan Note (Signed)
[facility]                              [facility] OFFICE NOTE   NAME:Robert [name], Robert [name]                    MRN:          [id number]  DATE:02/24/2006                            DOB:          1941-02-24    This is a 70 year old here for complete physical examination. In general, he  is doing well and has no particular complaints.  I have been following him  for some time for hypertension, obesity and elevated lipids but he had no  signs of coronary disease until he presented to the office in November 2006  complaining of exertional chest pain.  He was admitted and wound up having a  four-vessel coronary artery bypass grafting procedure performed by Dr. Kerin Perna on May 02, 2005.  Apparently this went quite well and he has  continued to follow up with Dr. Eden Emms ever since then with no shortness of  breath, on chest pain, and no other problems at all.  He is not getting much  in the way of exercise but has changed the way he eats.  He has been able to  lose about 25 pounds since this occurred.  For further details of his past  medical history, family history, social history, habits, etc., refer to our  last physical note dated September 03, 2004.   ALLERGIES:  None.  CURRENT MEDICATIONS:  1. Atenolol/chlorthalidone 50/25 once a day.  2. Benicar 20 mg per day.  3. K-Dur 20 mEq per day.   OBJECTIVE:  VITAL SIGNS:  Height 5 feet 7 inches, weight 214, BP 120/78,  pulse 60 and regular.  GENERAL:  He remains obese.  SKIN: Clear.  HEENT:  Eyes are clear.  He wears glasses.  Ears are clear.  Pharynx clear.  NECK:  Supple without lymphadenopathy, masses.  LUNGS:  Clear.  CARDIAC:  Regular rate and rhythm.  __________  .  No murmurs, rubs.  His  pulses are full.  ABDOMEN:  Soft.  Normal bowel sounds.  Nontender.  No masses.  GENITALIA:  Normal male.  RECTAL:  No mass or tenderness.  Prostate is mildly enlarged but smooth.  Stool Hemoccult  negative.  EXTREMITIES:  No clubbing, cyanosis or edema.  NEUROLOGIC:  Grossly intact.   LABORATORY DATA:  He was here for fasting labs on September 14.  These were  all within normal limits except for his lipid panel. HDL was low at 28 and  LDL was high at 160.   ASSESSMENT/PLAN:  1. Complete physical exam.  We talked about getting more exercise and      continuing to lose weight.  2. Hypertension, stable.  3. Coronary artery disease.  He will follow up with Dr. Eden Emms.  4. Hyperlipidemia.  Will begin aggressive treatment with Zocor 40 mg once      daily.   I will see him back to check another lipid level in three months.  Tera Mater. Clent Ridges, MD   SAF/MedQ  DD:  02/24/2006  DT:  02/28/2006  Job #:  [id number]

## 2010-10-22 NOTE — Cardiovascular Report (Signed)
Robert [name], Robert [name]             ACCOUNT NO.:  [id number]   MEDICAL RECORD NO.:  [id number]          PATIENT TYPE:  INP   LOCATION:  2853                         FACILITY:  [facility]   PHYSICIAN:  Salvadore Farber, M.D. LHCDATE OF BIRTH:  01-25-1941   DATE OF PROCEDURE:  04/29/2005  DATE OF DISCHARGE:                              CARDIAC CATHETERIZATION   PROCEDURE:  Left heart catheterization, left ventriculography, coronary  angiography, StarClose closure of the right common femoral arteriotomy site.   INDICATION:  Robert [name] is a 70 year old gentleman with risk factors of  the hypertension and dyslipidemia, who presents with 3 weeks of chest  discomfort occurring at exertion.  He has had no rest pain.  He presented  with this complaint to Dr. Tawanna Cooler this morning and was referred to the  emergency room.  Dr. Eden Emms saw him and recommended coronary angiography due  to classic symptoms.   PROCEDURAL TECHNIQUE:  Informed consent was obtained.  Under 1% lidocaine  local anesthesia, a 5-French sheath was placed in the right common femoral  artery using the modified Seldinger technique.  Diagnostic angiography and  ventriculography were performed using JL4, JR4, and pigtail catheters.  These images demonstrated a 95% stenosis of the proximal LAD extending  across the takeoff of a large diagonal.  There was also what appeared to be  a moderate stenosis of the ostium of the circumflex.  Films were reviewed  with Dr. Juanda Chance and decision made to proceed to percutaneous  revascularization.   Sheath was upsized over a wire to 7-French.  Heparin and double-bolus  eptifibatide were administered.  The patient was enrolled in the CHAMPION  trial.  The IV but not the oral study drug was administered.  (Thus, no  Plavix was potentially administered.)  A 7-French CLS 3.5 guide was advanced  over a wire and engaged in the ostial of the left main.  Angiography was  performed in a different angle than  had been obtained with the diagnostic  portion of the procedure.  This clearly demonstrated that the ostial  circumflex stenosis was more severe than had been appreciated previously.  Specifically, it was clearly greater than 70%.  Based on this, I felt he  would be better treated with surgical revascularization.  We therefore  removed the guide .  The arteriotomy was closed using a StarClose device.  Complete hemostasis was obtained.  He was then transferred to the holding  room in stable condition.   COMPLICATIONS:  None.   FINDINGS:  1.  LV: 112/4/9.  EF 70% without regional wall motion abnormality.  2.  No aortic stenosis or mitral regurgitation.  3.  Left main:  Twenty percent stenosis of the mid-vessel.  4.  LAD:  Moderate-sized vessel giving rise to a single large diagonal.      There is an ostial greater than 95% stenosis.  This plaque extends      across the origin of the large diagonal with approximately 90% stenosis      just downstream of the diagonal.  There is a focal 50% stenosis of the  mid-vessel.  The diagonal has a 30% to 40% ostial stenosis.  5.  Circumflex:  Moderate-sized vessel giving rise to a single large obtuse      marginal.  There is greater than 70% stenosis at the ostium.  6.  RCA:  Moderate-sized dominant vessel.  There is a 50% ostial stenosis      and mild plaquing of the mid-vessel.   IMPRESSION/PLAN:  The patient has severe two-vessel disease involving both  the proximal left anterior descending and the ostium of the circumflex.  We  will ask Cardiac Surgery to see him in consideration of coronary artery  bypass grafting.  The patient will be maintained on aspirin, heparin,  eptifibatide, beta blocker, and statin in the preoperative period.  Importantly, no Plavix was administered.      Salvadore Farber, M.D. [facility]  Electronically Signed     WED/MEDQ  D:  04/29/2005  T:  04/30/2005  Job:  [id number]   cc:   Tinnie Gens A. Tawanna Cooler, M.D. [facility]  [address] [place]  [place] [address]   Tera Mater. Clent Ridges, M.D. [facility]  [address] [place]  [place] [address]   Charlton Haws, M.D.  [address]  [address]  [place]  [place] [address]

## 2010-10-22 NOTE — Discharge Summary (Signed)
Robert [name], LANGAN NO.:  [id number]   MEDICAL RECORD NO.:  [id number]          PATIENT TYPE:  INP   LOCATION:  2024                         FACILITY:  [facility]   PHYSICIAN:  Kerin Perna, M.D.  DATE OF BIRTH:  [birth year]   DATE OF ADMISSION:  04/29/2005  DATE OF DISCHARGE:                                 DISCHARGE SUMMARY   ADMIT DIAGNOSIS:  Chest pain.   PAST MEDICAL HISTORY AND DISCHARGE DIAGNOSES:  1.  Hypertension.  2.  Hyperlipidemia.  3.  History of internal hemorrhoids.  4.  Two-vessel coronary artery disease, status post coronary artery bypass      grafting times four.  5.  Postoperative anemia, status post transfusion and stable.  6.  Postoperative atrial fibrillation, currently in sinus rhythm and on      Coumadin.   ALLERGIES:  NO KNOWN DRUG ALLERGIES.   BRIEF HISTORY:  The patient is a 70 year old Caucasian obese male who was  evaluated in the emergency room on April 29, 2005 for several-week  history of exertional chest pain.  The patient noted that pain was relieved  with rest and was not associated with nausea or diaphoresis.  In the ER, the  patient was evaluated with an EKG and cardiac enzymes which showed no  significant abnormality.  He was then evaluated and underwent cardiac  catheterization by Dr. Samule Ohm on April 29, 2005, and this revealed severe  two-vessel coronary artery disease which was not amenable to percutaneous  intervention.  Dr. Donata Clay of the CVTS service was subsequently consulted  regarding surgical revascularization.  Dr. Tyrone Sage evaluated the patient on  April 29, 2005, and it was his opinion that the patient should receive  coronary artery bypass graft surgery.   HOSPITAL COURSE:  The patient was admitted via the emergency room with  complaints of exertional chest pain and was taken for elective cardiac  catheterization as previously stated.  The patient was diagnosed with severe  two-vessel coronary  artery disease, and CVTS consult was obtained.  Dr. Donata Clay recommended patient proceed with surgical revascularization.  The  patient was maintained on routine hospital care and remained in stable  condition prior to surgery.   The patient was taken to the OR on May 02, 2005 for coronary artery  bypass grafting times four.  The left internal mammary artery was grafted to  the LAD, the left radial artery was grafted to the circumflex marginal,  saphenous vein was grafted to the diagonal, and saphenous vein was grafted  to the right coronary artery.  Endoscopic vessel harvesting was performed on  the right lower extremity.  The patient tolerated the procedure well and was  hemodynamically stable immediately postoperatively.  The patient was  transferred from the OR to the SICU in stable condition.  The patient was  extubated without complication and woke up from anesthesia neurologically  intact.   The patient's postoperative course has progressed relatively uncomplicated.  On postoperative day one, he was afebrile with stable vital signs and  maintaining a normal sinus rhythm.  The patient has been volume overloaded  postoperatively and has been diuresed accordingly.  He will require further  diuresis after discharge.  All invasive lines and chest tubes were  discontinued in a routine manner, and the patient tolerated this well.  The  patient began cardiac rehab on postoperative day one and has increased his  tolerance to a satisfactory level at this time.   On postoperative day two, the patient was noted to be in atrial fibrillation  with heart rate between 110 and 120.  He was started on IV amiodarone and  Cardizem.  The patient after several days subsequently converted to a normal  sinus rhythm and has remained in normal sinus rhythm since that time.  He  was started on Coumadin secondary to prolonged atrial fibrillation.  He will  remain on this after discharge as well.  The  patient was also started on  Imdur secondary to radial artery harvest.   On postoperative day four, the patient was noted to be anemic with an  hematocrit of 23.  Secondary to the anemia and a marginal hypotension, the  patient was transfused with a unit of packed RBCs.  Anemia and blood  pressure subsequently stabilized.  The patient was initially difficult to  wean from oxygen; however, he is currently on room air and has maintained  adequate oxygen saturations both with rest and ambulation.  On postoperative  day six, he is afebrile with stable vital signs and maintaining a normal  sinus rhythm.  He is without complaint, and his bowel function has returned.  On physical exam, cardiac is regular rate and rhythm.  Lungs with crackles  in the bilateral bases.  The abdomen is benign.  The incision looked clean,  dry and intact, and there is 2+ edema present in the bilateral lower  extremities.  The left upper extremity motor and sensation are intact, and  the hand is well perfused.  The patient is in stable condition at this time  and as long as he continues to progress in the current manner, he will be  ready for discharge within the next one to two days pending morning round  reevaluation.   LABORATORIES:  INR on May 08, 2005 is 1.1.  CBC on May 07, 2005:  Hematocrit 26, hemoglobin 8.8.  BMP on May 07, 2005:  Sodium 133,  potassium 3.9, BUN 25, creatinine 1.1.   CONDITION ON DISCHARGE:  Improved.   MEDICATIONS:  1.  Aspirin 81 mg every day.  2.  Toprol XL 25 mg every day.  3.  Digoxin 0.125 mg every day.  4.  Amiodarone 200 mg b.i.d.  5.  Coumadin dose to be determined on date of discharge.  6.  Lasix 40 mg every day times seven days.  7.  K-Dur 20 mEq every day times seven days.  8.  Iron 325 mg every day.  9.  Imdur 30 mg every day.  10. Tylox one to two every four to six hours p.r.n. pain.   ACTIVITY:  No driving, no lifting of more than ten pounds, and the  patient should continue daily breathing and walking exercises.   DIET:  Low salt, low fat.   WOUND CARE:  The patient may shower daily and clean the incisions with soap  and water.  If wound problems arise, he should contact the CVTS office at  [phone].   FOLLOWUP APPOINTMENTS:  PT-INR blood work to be drawn on May 10, 2005 at  [facility].  [facility] will be responsible  for  establishing this appointment.  Next, with Dr. Eden Emms two weeks after  discharge.  [facility] will also be responsible for establishing  this appointment.  A chest x-ray  will be taken at that time which the patient will be instructed to bring  with him to the followup appointment with Dr. Donata Clay three weeks after  discharge.  CVTS office will contact the patient with the date and time of  that appointment.      Pecola Leisure, [place]      Kerin Perna, M.D.  Electronically Signed    AY/MEDQ  D:  05/08/2005  T:  05/09/2005  Job:  [id number]   cc:   Charlton Haws, M.D.  [address]  [address]  [place]  [place] [address]

## 2010-10-22 NOTE — Op Note (Signed)
NAMETYRUS, WILMS             ACCOUNT NO.:  [id number]   MEDICAL RECORD NO.:  [id number]          PATIENT TYPE:  INP   LOCATION:  2311                         FACILITY:  [facility]   PHYSICIAN:  Kerin Perna, M.D.  DATE OF BIRTH:  Feb 19, 1941   DATE OF PROCEDURE:  05/02/2005  DATE OF DISCHARGE:                                 OPERATIVE REPORT   OPERATION:  Coronary artery bypass grafting x4 (left internal mammary artery  to LAD, left radial artery graft to circumflex marginal, endoscopically  harvested saphenous vein grafts to the diagonal and right coronary artery).   PREOPERATIVE DIAGNOSES:  Class 4 post-infarction unstable angina, with  severe 3-vessel coronary artery disease surgeon.   SURGEON:  Kerin Perna, M.D.   ASSISTANT:  Danelle Earthly, PA-C   ANESTHESIA:  General.   INDICATIONS:  The patient is a 70 year old obese male who presented with  unstable angina and ruled in with positive cardiac enzymes. Cardiac  catheterization demonstrated severe 3-vessel coronary artery disease with  90% stenosis to The LAD diagonal, 80% stenosis of the ostium of the  circumflex, and 50% stenosis of the proximal right coronary. His LV systolic  function was preserved. He was not felt to be candidate for percutaneous  coronary intervention and was felt to be a candidate instead for surgical  revascularization.   Prior to the surgery I examined the patient in his CCU room with results of  the cardiac catheterization with the patient and family. I discussed the  indications and expected benefits of coronary bypass surgery for treatment  of his coronary artery disease. I reviewed the alternatives to surgical  therapy as well. I discussed with the patient and family the major aspects  of the planned procedure, including the choice of conduit to include  internal mammary artery, left free radial artery, and endoscopically  harvested saphenous vein. I discussed with the patient the  location of the  surgical incisions, the use of general anesthesia, and the expected  postoperative hospital recovery. I also reviewed with the patient the risks  to him of coronary artery bypass surgery, including the risks of MI, CVA,  bleeding, blood transfusion requirement, infection, left hand ischemia or  numbness, and death. He understood these implications for the surgery and  agreed to proceed with the operation as planned, under what I felt was an  informed consent.   OPERATIVE FINDINGS:  The patient's body habitus including short stature and  obesity made exposure the heart difficult. The deep blades were required for  the sternal retractor. Cannulation of the aorta was difficult due to the  depth of the aorta and the thorax. The coronaries were severely diseased.  The saphenous vein was of adequate quality. The mammary artery and radial  artery were also adequate conduits. Prior to harvesting the radial artery,  the presence of an adequate ulnar pulse was documented with the transdermal  Doppler. The patient did not require any blood transfusions or inotropic  support for this operation. The aprotinin protocol was used to minimize  blood transfusion requirement and bleeding.   PROCEDURE:  The patient was  brought to operating room and placed supine on  the operating room table, where anterior general anesthesia was induced  under invasive hemodynamic monitoring. The chest, abdomen and legs were  prepped with Betadine and draped as a sterile field.  A left arm incision  was made and the left radial artery was harvested as a free graft, using the  harmonic scalpel. The incision was closed and the arm tucked back to the  side, while maintaining a sterile field.   A sternal incision was then made as the saphenous vein was harvested  endoscopically from the right leg. The left internal mammary artery was  harvested as a pedicle graft from its origin at the subclavian vessels.   Heparin was administered and the ACT was documented as being therapeutic.  The sternal retractor was placed using the deep blades. The pericardium was  opened and suspended as a cradle. Pursestrings were placed in the ascending  aorta and the right atrium. The patient was cannulated and placed on bypass.  The coronaries were dissected and identified for grafting. The mammary  artery, radial artery, and saphenous veins were prepared for the distal  anastomoses. Cardioplegia catheters were placed for both antegrade aortic  and retrograde coronary sinus cardioplegia. The patient was cooled to 32  degrees. The aortic crossclamp was applied; 800 cc of cold blood  cardioplegia was delivered to the aortic root with good cardioplegic arrest.  There was some difficulty with the arterial inflow pressures after applying  the crossclamp, due to the patient's depth of his thorax, due to obesity and  the angle of the curved tip cannula. After changing a right-angle clamp for  a straight Fogarty vascular clamp,  the arterial inflow pressures were  maintained in a normal range for the remainder of the case.   After adequate cardioplegic arrest was performed, the distal coronary  anastomoses were performed. The first distal anastomosis was the distal  right coronary. This is a 1.5 mm vessel with proximal 50% stenosis. A  reverse saphenous vein sewn was end-to-side with running 7-0 Prolene; there  was good flow through the graft. The second distal anastomosis was of the  diagonal branch of the LAD. This was a 1.5 mm vessel with proximal 90%  stenosis. A reverse saphenous vein was sewn end-to-side with running 7-0  Prolene. There was good flow through the graft. Cardioplegia was redosed.  The third distal anastomosis was of the OM-1 branch of the circumflex. This  was a 1.5 mm vessel with proximal 80% ostial stenosis. The left radial artery graft was sewn end-to-side with running 8-0 Prolene and there was   good flow through the graft. Cardioplegia was again administered. The fourth  and final distal anastomosis was to the mid portion of the LAD.  This was a  1.5 mm vessel with proximal 90% stenosis. The left internal mammary artery  pedicle was brought out through an opening created in the left lateral  pericardium, and was brought down onto the LAD and sewn end-to-side with  running 8-0 Prolene. There was excellent flow through the anastomosis, after  briefly releasing the bulldog pedicle clamp on the mammary pedicle. The  bulldog was then reapplied and the pedicle was secured to the epicardium  with 6-0 Prolene sutures. Cardioplegia was redosed.   While the crossclamp was still in place, 3 proximal vein anastomoses were  placed on the ascending aorta. The radial artery free graft was most  superiorly on the aorta, with the vein grafts to  the diagonal and right  coronary inferiorly. The anastomoses were performed with a 4.0 mm punch with  running 7-0 Prolene. Prior to tying down the final proximal anastomosis, air  was vented from the coronaries and left side of heart with a dose of  retrograde warm blood cardioplegia, and the usual de-airing maneuvers on  bypass. The crossclamp was then removed, as the final proximal anastomosis  was tied down.   The heart was cardioverted back to a regular rhythm. Air was aspirated from  the vein grafts with a 27-gauge needle, and the grafts were opened and each  had good flow. The cardioplegia catheters were removed. The patient was  rewarmed and reperfused to 37 degrees. The lungs were re-expanded. The  ventilator was resumed. There was hemostasis of the proximal and distal  anastomoses. The patient was then weaned from bypass without inotropes in a  stable sinus rhythm, with stable hemodynamics and adequate cardiac output.  Protamine was administered without adverse reaction. The cannulas were  removed. The mediastinum was irrigated with warm  antibiotic irrigation.  There was adequate hemostasis. The superior pericardial fat was closed with  interrupted suture. Two mediastinal and a left pleural chest tube were  placed and brought out through separate incisions. The sternum was closed  with interrupted steel wire. The pectoralis fascia was closed in running #1  Vicryl. Subcutaneous and skin layers were closed with a running Vicryl and  sterile dressings were applied. Prior to taking off the sterile drapes, a  right subclavian triple-  lumen catheter was placed in the sterile field over a guidewire and secured  to the skin. A follow-up chest x-ray confirmed this to be in the proper  location.   The patient returned to the ICU in stable condition. Total bypass time was  130 minutes.      Kerin Perna, M.D.  Electronically Signed     PV/MEDQ  D:  05/02/2005  T:  05/03/2005  Job:  [id number]

## 2010-10-22 NOTE — Assessment & Plan Note (Signed)
[facility]                            CARDIOLOGY OFFICE NOTE   NAME:Robert [name], Robert [name]                    MRN:          [id number]  DATE:05/26/2006                            DOB:          [birth year]    [name] returns today for followup.  He is status post CABG.  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  He is doing  well, he has not had any significant chest pain.  His myalgias were much  improved off the Crestor.  He has been restarted on simvastatin without  recurrence.   MEDICATIONS:  1. Calcium.  2. Potassium 20 a day.  3. Atenolol hydrochlorothiazide 50/25.  4. Benicar 20 a day.  5. An aspirin a day.  6. Simvastatin.   PHYSICAL EXAMINATION:  His weight today is stable at 217.  Blood  pressure is 118/72, pulse is 74 and regular.  HEENT:  Normal.  LUNGS:  Clear.  CAROTIDS:  Normal.  STERNUM:  Well healed.  ABDOMEN:  Benign.  Distal pulses are intact with no edema.  Left radial harvest site is  also good with no steal in the left hand.   His EKG is totally normal today.   IMPRESSION:  Stable status post coronary artery bypass grafting on  May 02, 2005, with internal mammary artery to the left anterior  descending, radial graft to the circumflex marginal and vein graft to  the diagonal and right coronary artery.   Normal left ventricular function, continue risk factor modification and  followup liver function tests in 6 months on simvastatin, continue  aspirin therapy.  Blood pressure well controlled on atenolol and  Benicar.  I will see him back in 6 months.     Noralyn Pick. Eden Emms, MD, [facility]  Electronically Signed    PCN/MedQ  DD: 05/26/2006  DT: 05/26/2006  Job #: [id number]

## 2010-10-22 NOTE — Consult Note (Signed)
NAMEASCHER, SCHROEPFER NO.:  [id number]   MEDICAL RECORD NO.:  [id number]          PATIENT TYPE:  INP   LOCATION:  2904                         FACILITY:  [facility]   PHYSICIAN:  Kerin Perna, M.D.  DATE OF BIRTH:  September 24, 1940   DATE OF CONSULTATION:  DATE OF DISCHARGE:                                   CONSULTATION   PHYSICIAN REQUESTING CONSULTATION:  Salvadore Farber, M.D.   PRIMARY CARDIOLOGIST:  Charlton Haws, M.D.   PRIMARY CARE PHYSICIAN:  Tinnie Gens A. Tawanna Cooler, M.D.   REASON FOR CONSULTATION:  Severe two-vessel coronary artery disease with  unstable angina.   CHIEF COMPLAINT:  Exertional chest discomfort.   HISTORY OF PRESENT ILLNESS:  I was asked to evaluate this 70 year old white  male obese patient for potential surgical coronary revascularization for  recently-diagnosed severe two-vessel coronary artery disease.  The patient  has had exertional chest pain for the past few weeks.  Because of increased  intensity and frequency of these episodes, he presented to the emergency  department.  The patient notes that the cold weather and walking up hills  made his symptoms worse.  The worst episode occurred just prior to admission  when he walked to his mailbox and had severe substernal pain radiating to  the shoulders and arms but no associated nausea or diaphoresis.  The  patient's symptoms resolved after resting 10-20 minutes.  The patient was  evaluated with an EKG and cardiac enzymes, which showed no significant  abnormality.  He was evaluated with a cardiac catheterization by Dr. Randa Evens, which demonstrated a high-grade 90% stenosis to the LAD diagonal,  high-grade 80% stenosis of the circumflex ostium, and 50% stenosis of the  proximal right coronary, with preserved LV systolic function.  The left  ventricular end-diastolic pressure was 10 mmHg.  Because of the patient's  coronary anatomy, he is not felt to be a candidate for percutaneous  intervention and surgical revascularization was recommended.   PAST MEDICAL HISTORY:  1.  No known drug allergies.  2.  No previous operations or hospitalizations.  3.  Hypertension since 1986.  4.  Hyperlipidemia.  5.  History of internal hemorrhoids.   MEDICATIONS PRIOR TO ADMISSION:  1.  Atenolol/chlorthalidone 50/25 mg daily.  2.  Crestor 20 mg daily.  3.  Benicar 20 mg daily.  4.  Potassium 20 mEq a day.   SOCIAL HISTORY:  The patient works as a Sports administrator.  He did not do any  heavy lifting.  He has not smoked or used alcohol.  He is married with grown  children.   FAMILY HISTORY:  Negative for coronary artery disease or myocardial  infarction.   REVIEW OF SYSTEMS:  CONSTITUTIONAL:  Negative for fever or weight loss.  He  has had a weight problem for several years, which has been stable.  HEENT:  Negative for active dental complaints, difficulty swallowing or change in  vision.  THORACIC:  Negative for chest trauma, history of abnormal chest x-  ray or productive cough or recent URI.  CARDIAC:  Positive for his unstable  angina, negative  for prior cardiac symptoms.  GASTROINTESTINAL:  Negative  for change of bowel habits, blood per rectum or jaundice or hepatitis.  UROLOGIC:  Negative for BPH or kidney stones.  He does have erectile  dysfunction.  ENDOCRINE:  Negative for diabetes or thyroid disease.  His  hemoglobin A1c is 6.0.  VASCULAR:  Negative for claudication, TIA or DVT.  NEUROLOGIC:  Negative for stroke or seizure.  HEMATOLOGIC:  Negative for  bleeding disorder.  He does have some groin bleeding of the catheterization  site while he is on Integrilin and heparin.   PHYSICAL EXAMINATION:  VITAL SIGNS:  The patient is 5 feet 7 inches, weight  240 pounds.  Blood pressure is 120/72, pulse 80 in sinus, respirations 18.  GENERAL APPEARANCE:  That of a pleasant middle-aged, overweight white male  in no acute distress in the CCU following cardiac catheterization.  He is   accompanied by his wife.  He has a pressure dressing over his right groin.  HEENT:  Normocephalic, full EOMs.  Pharynx clear.  NECK:  Without JVD, mass or carotid bruit.  LYMPHATIC:  No palpable supraclavicular or axillary adenopathy.  CHEST:  Thorax is without deformity and breath sounds are clear bilaterally.  CARDIAC:  A regular rhythm without S3 gallop or murmur.  ABDOMEN:  Obese, soft, nontender, without organomegaly.  EXTREMITIES:  Peripheral pulses are 2+ in all extremities.  There is no  evidence of venous insufficiency of the lower extremities.  There is no  clubbing or cyanosis of his extremities and no joint deformity.  SKIN:  Without rash or lesion.  NEUROLOGIC:  He is alert and oriented without focal motor deficit.   LABORATORY DATA:  His creatinine is 0.9.  Hemoglobin A1c 6.0.  Glucose 104.  Hematocrit 41%, platelet count 199,000, white count 7000.  Chest x-ray:  No  active disease.   I reviewed the coronary arteriograms with Dr. Samule Ohm and agree with the  interpretation of severe two-vessel disease with mild to moderate stenosis  of the proximal right coronary as well.  He has preserved LV function.   IMPRESSION AND PLAN:  The patient would be a candidate for surgical  revascularization with bypass grafts placed at the left anterior descending  coronary artery, diagonal, obtuse marginal, and potentially to the right  coronary.  I have reviewed the operation in detail with the patient and his  wife, including the indications and benefits of surgery, the alternatives of  surgery, the use of general anesthesia and cardiopulmonary bypass, and the  expected postoperative recovery period.  We also reviewed the risks of  surgery.  After this discussion, the patient is agreeable  to proceed with the operation of coronary bypass surgery, which will be  scheduled for Monday, November 27.  We will potentially use mammary and radial artery pending the outcome of his pre-CABG Doppler  study.   Thank you very much for this consultation.      Kerin Perna, M.D.  Electronically Signed     PV/MEDQ  D:  04/30/2005  T:  04/30/2005  Job:  [id number]   cc:   Tinnie Gens A. Tawanna Cooler, M.D. [facility]  [address] [place]  [place] [address]   Tera Mater. Clent Ridges, M.D. [facility]  [address] [place]  [place] [address]   Charlton Haws, M.D.  [address]  [place]  [place] [address]

## 2010-10-22 NOTE — H&P (Signed)
Robert [name], Robert [name]             ACCOUNT NO.:  [id number]   MEDICAL RECORD NO.:  [id number]          PATIENT TYPE:  INP   LOCATION:  1827                         FACILITY:  [facility]   PHYSICIAN:  Robert [name], M.D.     DATE OF BIRTH:  Sep 09, 1940   DATE OF ADMISSION:  04/29/2005  DATE OF DISCHARGE:                                HISTORY & PHYSICAL   PRIMARY CARE PHYSICIAN:  Robert [name] at [place].   REASON FOR ADMISSION:  Robert [name] is a 70 year old male with no prior  cardiac history, but with cardiac risk factors notable for longstanding  hypertension, hyperlipidemia, obesity, and age, who now presents to the  emergency room with new-onset exertional chest discomfort.   The patient reports development of upper chest pressure associated with  moderate walking over these past three weeks.  His symptoms always respond  to rest.  He does note radiation to both upper extremities down the elbow.  This past Wednesday however, he had his worst episode to date (5/10) again  while walking to the mailbox.  There was no associated diaphoresis or  nausea, but he did have bilateral biceps discomfort.  After sitting down for  approximately 20 minutes, his symptoms completely resolved.   The patient presented to the Robert [name] in the office today for  evaluation of chest discomfort, and was promptly referred to the emergency  room.  He denies any recurrent chest pain since the episode two days ago.  However, he reports feeling bad this morning.   Electrocardiogram on admission shows sinus bradycardia with no acute  changes.   ALLERGIES:  NO KNOWN DRUG ALLERGIES.   MEDICATIONS PRIOR TO ADMISSION:  1.  Atenolol/chlorthalidone 50/25 mg daily.  2.  Crestor 20 mg daily.  3.  Benicar 20 mg daily.  4.  K-Dur 20 mEq daily.   PAST MEDICAL HISTORY:  1.  Hypertension since 1986.  2.  Hyperlipidemia.  3.  History of internal hemorrhoids.  4.  Erectile dysfunction.   PAST SURGICAL  HISTORY:  The patient reports never having been hospitalized.   SOCIAL HISTORY:  The patient lives in [place] with his wife.  They have  two grown daughters.  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  He has  never smoked tobacco and denies alcohol use.   FAMILY HISTORY:  Both parents deceased, neither of whom had heart disease.  His siblings have no known heart disease as well.   REVIEW OF SYSTEMS:  Denies any recent development of exertional dyspnea,  orthopnea, paroxysmal dyspnea or lower extremity edema.  He denies any  recent evidence of upper or lower GI bleeding . He denies any symptoms of  suggestive of reflux disease.  Otherwise as noted per HPI.  Remaining  systems are negative.   PHYSICAL EXAMINATION:  Blood pressure 120/59, pulse 74 regular, respirations  20, temperature 98.2, sats 97% on room air.  GENERAL:  A 70 year old obese, male in no apparent distress.  HEENT:  Normocephalic and atraumatic.  NECK:  Preserved bilateral carotid pulses without bruits.  LUNGS:  Clear to auscultation in  all fields.  HEART:  Regular rate and rhythm (S1, S2), no murmurs, rubs or gallops.  ABDOMEN:  Protuberant, nontender.  EXTREMITIES:  Preserved bilateral femoral pulses without bruits; no pedal  edema.  NEUROLOGIC:  No focal deficits.   Electrocardiogram -- sinus bradycardia at 56 beats per minute, with normal  axis; no acute changes.   LABORATORY DATA:  Pending.   IMPRESSION:  1.  Unstable angina pectoris.  2.  Multiple cardiac risk factors.      1.  Longstanding hypertension.      2.  Hyperlipidemia.      3.  Age.      4.  Obesity.   PLAN:  The patient will be admitted to telemetry for further evaluation and  management of symptoms worrisome for new onset unstable anginal pectoris.  The patient presents with a classic description of exertional angina over  these past few weeks, with a recent episode which was over these past few  weeks and denies any symptoms  occurring at rest.  His electrocardiogram is  benign and the patient is currently stable, without complaint of chest, or  arm pain.   RECOMMENDATIONS:  The proceed with diagnostic cardiac catheterization and  possible percutaneous intervention.  The risks/benefits of the procedure  have been described, and the patient is agreeable to proceed.  He will be  treated with four baby aspirin in the emergency room and continued on his  home medication regimen.  In  addition to cycling cardiac markers, we will also check complete blood work  including a fasting lipid profile and a TSH level.  We will also check  complete blood work including a TSH level and a fasting lipid profile in the  morning.      Robert [name], P.A. [facility]    ______________________________  Robert [name], M.D.    GS/MEDQ  D:  04/29/2005  T:  04/29/2005  Job:  [id number]   cc:   Robert [name], M.D. [facility]  [address] [place]  [place] [address]   Robert [name], M.D. [facility]  [address] [place]  [place] [address]

## 2011-08-05 ENCOUNTER — Telehealth: Payer: Self-pay | Admitting: Family Medicine

## 2011-08-05 NOTE — Telephone Encounter (Signed)
Pt called and sch fasting cpx for 10/14/11 at 9:00am. Pt is going to need refills for atenolol-chlorthalidone (TENORETIC) 50-25 MG per tablet ,losartan (COZAAR) 100 MG tablet sent in to [facility] phone # is [phone], 90 day supply to last until ov. Pt says that he still has 30 days remaining on current script.

## 2011-08-08 MED ORDER — LOSARTAN POTASSIUM 100 MG PO TABS: 100.0000 mg | ORAL_TABLET | Freq: Every day | ORAL | Status: DC

## 2011-08-08 MED ORDER — ATENOLOL-CHLORTHALIDONE 50-25 MG PO TABS: 1.0000 | ORAL_TABLET | Freq: Every day | ORAL | Status: DC

## 2011-08-08 NOTE — Telephone Encounter (Signed)
Scripts sent e-scribe to [facility] and Dr. Clent Ridges did approve the 90 day supply. Pt has a CPE scheduled for 10/14/11.

## 2011-08-26 ENCOUNTER — Ambulatory Visit: Payer: Medicare Other | Admitting: Family Medicine

## 2011-10-12 ENCOUNTER — Encounter: Payer: Self-pay | Admitting: Family Medicine

## 2011-10-14 ENCOUNTER — Ambulatory Visit (INDEPENDENT_AMBULATORY_CARE_PROVIDER_SITE_OTHER): Payer: Medicare Other | Admitting: Family Medicine

## 2011-10-14 ENCOUNTER — Encounter: Payer: Self-pay | Admitting: Family Medicine

## 2011-10-14 VITALS — BP 160/92 | HR 93 | Temp 98.0°F | Ht 66.0 in | Wt 213.0 lb

## 2011-10-14 DIAGNOSIS — N139 Obstructive and reflux uropathy, unspecified: Secondary | ICD-10-CM

## 2011-10-14 DIAGNOSIS — E785 Hyperlipidemia, unspecified: Secondary | ICD-10-CM | POA: Diagnosis not present

## 2011-10-14 DIAGNOSIS — I2510 Atherosclerotic heart disease of native coronary artery without angina pectoris: Secondary | ICD-10-CM

## 2011-10-14 DIAGNOSIS — N401 Enlarged prostate with lower urinary tract symptoms: Secondary | ICD-10-CM | POA: Diagnosis not present

## 2011-10-14 DIAGNOSIS — I10 Essential (primary) hypertension: Secondary | ICD-10-CM | POA: Diagnosis not present

## 2011-10-14 DIAGNOSIS — Z0000 Encounter for general adult medical examination without abnormal findings: Secondary | ICD-10-CM

## 2011-10-14 DIAGNOSIS — N138 Other obstructive and reflux uropathy: Secondary | ICD-10-CM

## 2011-10-14 LAB — CBC WITH DIFFERENTIAL/PLATELET
Basophils Absolute: 0 K/uL (ref 0.0–0.1)
Basophils Relative: 0.5 % (ref 0.0–3.0)
Eosinophils Absolute: 0.2 K/uL (ref 0.0–0.7)
Eosinophils Relative: 3.3 % (ref 0.0–5.0)
HCT: 47.4 % (ref 39.0–52.0)
Hemoglobin: 15.7 g/dL (ref 13.0–17.0)
Lymphocytes Relative: 19.5 % (ref 12.0–46.0)
Lymphs Abs: 1.3 K/uL (ref 0.7–4.0)
MCHC: 33.2 g/dL (ref 30.0–36.0)
MCV: 96.5 fl (ref 78.0–100.0)
Monocytes Absolute: 0.4 K/uL (ref 0.1–1.0)
Monocytes Relative: 6 % (ref 3.0–12.0)
Neutro Abs: 4.7 K/uL (ref 1.4–7.7)
Neutrophils Relative %: 70.7 % (ref 43.0–77.0)
Platelets: 173 K/uL (ref 150.0–400.0)
RBC: 4.91 Mil/uL (ref 4.22–5.81)
RDW: 13.3 % (ref 11.5–14.6)
WBC: 6.6 K/uL (ref 4.5–10.5)

## 2011-10-14 LAB — HEPATIC FUNCTION PANEL
ALT: 29 U/L (ref 0–53)
AST: 30 U/L (ref 0–37)
Albumin: 4.3 g/dL (ref 3.5–5.2)
Alkaline Phosphatase: 52 U/L (ref 39–117)
Bilirubin, Direct: 0.2 mg/dL (ref 0.0–0.3)
Total Bilirubin: 0.9 mg/dL (ref 0.3–1.2)
Total Protein: 6.8 g/dL (ref 6.0–8.3)

## 2011-10-14 LAB — POCT URINALYSIS DIPSTICK
Glucose, UA: NEGATIVE
Leukocytes, UA: NEGATIVE
Nitrite, UA: NEGATIVE
Spec Grav, UA: 1.02
Urobilinogen, UA: 1
pH, UA: 6.5

## 2011-10-14 LAB — TSH: TSH: 1.75 u[IU]/mL (ref 0.35–5.50)

## 2011-10-14 LAB — BASIC METABOLIC PANEL
BUN: 21 mg/dL (ref 6–23)
CO2: 26 mEq/L (ref 19–32)
Chloride: 102 mEq/L (ref 96–112)
Creatinine, Ser: 0.8 mg/dL (ref 0.4–1.5)

## 2011-10-14 LAB — PSA: PSA: 1.57 ng/mL (ref 0.10–4.00)

## 2011-10-14 LAB — LIPID PANEL
Cholesterol: 132 mg/dL (ref 0–200)
Triglycerides: 78 mg/dL (ref 0.0–149.0)

## 2011-10-14 MED ORDER — LOSARTAN POTASSIUM 100 MG PO TABS: 100.0000 mg | ORAL_TABLET | Freq: Every day | ORAL | Status: DC

## 2011-10-14 MED ORDER — ATORVASTATIN CALCIUM 40 MG PO TABS: 40.0000 mg | ORAL_TABLET | Freq: Every day | ORAL | Status: DC

## 2011-10-14 MED ORDER — ATENOLOL-CHLORTHALIDONE 50-25 MG PO TABS: 1.0000 | ORAL_TABLET | Freq: Two times a day (BID) | ORAL | Status: DC

## 2011-10-14 NOTE — Progress Notes (Signed)
  Subjective:    Patient ID: Robert [name], male    DOB: 1940-10-31, 71 y.o.   MRN: [id number]  HPI 71 yr old male for a cpx. He feels fine and has no complaints. He walks for 40 minutes 3 days a week. His BP at home has been running in the 140s systolic. He has not seen Dr. Eden Emms for a year and a half. He had an ECHO last January that he says came out okay.    Review of Systems  Constitutional: Negative.   HENT: Negative.   Eyes: Negative.   Respiratory: Negative.   Cardiovascular: Negative.   Gastrointestinal: Negative.   Genitourinary: Negative.   Musculoskeletal: Negative.   Skin: Negative.   Neurological: Negative.   Hematological: Negative.   Psychiatric/Behavioral: Negative.        Objective:   Physical Exam  Constitutional: He is oriented to person, place, and time. He appears well-developed and well-nourished. No distress.  HENT:  Head: Normocephalic and atraumatic.  Right Ear: External ear normal.  Left Ear: External ear normal.  Nose: Nose normal.  Mouth/Throat: Oropharynx is clear and moist. No oropharyngeal exudate.  Eyes: Conjunctivae and EOM are normal. Pupils are equal, round, and reactive to light. Right eye exhibits no discharge. Left eye exhibits no discharge. No scleral icterus.  Neck: Neck supple. No JVD present. No tracheal deviation present. No thyromegaly present.  Cardiovascular: Normal rate, regular rhythm, normal heart sounds and intact distal pulses.  Exam reveals no gallop and no friction rub.   No murmur heard.      EKG normal   Pulmonary/Chest: Effort normal and breath sounds normal. No respiratory distress. He has no wheezes. He has no rales. He exhibits no tenderness.  Abdominal: Soft. Bowel sounds are normal. He exhibits no distension and no mass. There is no tenderness. There is no rebound and no guarding.  Genitourinary: Rectum normal, prostate normal and penis normal. Guaiac negative stool. No penile tenderness.  Musculoskeletal: Normal  range of motion. He exhibits no edema and no tenderness.  Lymphadenopathy:    He has no cervical adenopathy.  Neurological: He is alert and oriented to person, place, and time. He has normal reflexes. No cranial nerve deficit. He exhibits normal muscle tone. Coordination normal.  Skin: Skin is warm and dry. No rash noted. He is not diaphoretic. No erythema. No pallor.  Psychiatric: He has a normal mood and affect. His behavior is normal. Judgment and thought content normal.          Assessment & Plan:  Well exam. He needs to lose some weight. I suggested he exercise 5-6 days a week. Get fasting labs. Switch from Zocor to Lipitor. Increase Tenoretic to bid. I advised him to see Dr. Eden Emms again soon

## 2011-10-19 ENCOUNTER — Encounter: Payer: Self-pay | Admitting: Family Medicine

## 2011-10-19 MED ORDER — POTASSIUM CHLORIDE ER 10 MEQ PO TBCR: 10.0000 meq | EXTENDED_RELEASE_TABLET | Freq: Every day | ORAL | Status: DC

## 2011-10-19 NOTE — Progress Notes (Signed)
Addended by: Aniceto Boss A on: 10/19/2011 09:11 AM   Modules accepted: Orders

## 2011-10-19 NOTE — Progress Notes (Signed)
Quick Note:  Spoke with pt, sent script e-scribe and put a copy of results in mail. ______

## 2012-01-27 ENCOUNTER — Other Ambulatory Visit: Payer: Self-pay | Admitting: Family Medicine

## 2012-02-24 ENCOUNTER — Telehealth: Payer: Self-pay | Admitting: Family Medicine

## 2012-02-24 MED ORDER — POTASSIUM CHLORIDE ER 10 MEQ PO TBCR: 10.0000 meq | EXTENDED_RELEASE_TABLET | Freq: Every day | ORAL | Status: DC

## 2012-02-24 NOTE — Telephone Encounter (Signed)
I sent script e-scribe to [facility].  

## 2012-02-24 NOTE — Telephone Encounter (Signed)
Patient called stating that his potassium need to be sent to [facility] instead of [facility]. Please assist.

## 2012-03-30 DIAGNOSIS — Z23 Encounter for immunization: Secondary | ICD-10-CM | POA: Diagnosis not present

## 2012-11-20 ENCOUNTER — Encounter: Payer: Self-pay | Admitting: Family Medicine

## 2012-11-20 ENCOUNTER — Ambulatory Visit (INDEPENDENT_AMBULATORY_CARE_PROVIDER_SITE_OTHER): Payer: Medicare Other | Admitting: Family Medicine

## 2012-11-20 VITALS — BP 150/84 | HR 72 | Ht 66.25 in | Wt 224.0 lb

## 2012-11-20 DIAGNOSIS — N401 Enlarged prostate with lower urinary tract symptoms: Secondary | ICD-10-CM

## 2012-11-20 DIAGNOSIS — I10 Essential (primary) hypertension: Secondary | ICD-10-CM

## 2012-11-20 DIAGNOSIS — N138 Other obstructive and reflux uropathy: Secondary | ICD-10-CM

## 2012-11-20 DIAGNOSIS — N139 Obstructive and reflux uropathy, unspecified: Secondary | ICD-10-CM | POA: Diagnosis not present

## 2012-11-20 DIAGNOSIS — E669 Obesity, unspecified: Secondary | ICD-10-CM | POA: Diagnosis not present

## 2012-11-20 DIAGNOSIS — I2510 Atherosclerotic heart disease of native coronary artery without angina pectoris: Secondary | ICD-10-CM

## 2012-11-20 DIAGNOSIS — E785 Hyperlipidemia, unspecified: Secondary | ICD-10-CM | POA: Diagnosis not present

## 2012-11-20 LAB — CBC WITH DIFFERENTIAL/PLATELET
Basophils Absolute: 0 10*3/uL (ref 0.0–0.1)
Eosinophils Absolute: 0.3 10*3/uL (ref 0.0–0.7)
HCT: 49.1 % (ref 39.0–52.0)
Hemoglobin: 16.4 g/dL (ref 13.0–17.0)
Lymphs Abs: 1.7 10*3/uL (ref 0.7–4.0)
MCHC: 33.5 g/dL (ref 30.0–36.0)
MCV: 95.7 fl (ref 78.0–100.0)
Monocytes Absolute: 0.6 10*3/uL (ref 0.1–1.0)
Monocytes Relative: 6.6 % (ref 3.0–12.0)
Neutro Abs: 6.7 10*3/uL (ref 1.4–7.7)
Platelets: 229 10*3/uL (ref 150.0–400.0)
RDW: 13.4 % (ref 11.5–14.6)

## 2012-11-20 LAB — LIPID PANEL
LDL Cholesterol: 86 mg/dL (ref 0–99)
Total CHOL/HDL Ratio: 5

## 2012-11-20 LAB — HEPATIC FUNCTION PANEL
ALT: 27 U/L (ref 0–53)
AST: 25 U/L (ref 0–37)
Bilirubin, Direct: 0.1 mg/dL (ref 0.0–0.3)
Total Protein: 7.1 g/dL (ref 6.0–8.3)

## 2012-11-20 LAB — BASIC METABOLIC PANEL
CO2: 26 mEq/L (ref 19–32)
Calcium: 10.1 mg/dL (ref 8.4–10.5)
Chloride: 100 mEq/L (ref 96–112)
Sodium: 141 mEq/L (ref 135–145)

## 2012-11-20 LAB — TSH: TSH: 2.41 u[IU]/mL (ref 0.35–5.50)

## 2012-11-20 LAB — POCT URINALYSIS DIPSTICK
Spec Grav, UA: 1.02
Urobilinogen, UA: 1
pH, UA: 6

## 2012-11-20 NOTE — Progress Notes (Signed)
  Subjective:    Patient ID: Robert [name], male    DOB: [birth year], 72 y.o.   MRN: [id number]  HPI 72 yr old male for a cpx. He feels fine and has no concerns. He is trying to lose some weight.    Review of Systems  Constitutional: Negative.   HENT: Negative.   Eyes: Negative.   Respiratory: Negative.   Cardiovascular: Negative.   Gastrointestinal: Negative.   Genitourinary: Negative.   Musculoskeletal: Negative.   Skin: Negative.   Neurological: Negative.   Psychiatric/Behavioral: Negative.        Objective:   Physical Exam  Constitutional: He is oriented to person, place, and time. He appears well-developed and well-nourished. No distress.  HENT:  Head: Normocephalic and atraumatic.  Right Ear: External ear normal.  Left Ear: External ear normal.  Nose: Nose normal.  Mouth/Throat: Oropharynx is clear and moist. No oropharyngeal exudate.  Eyes: Conjunctivae and EOM are normal. Pupils are equal, round, and reactive to light. Right eye exhibits no discharge. Left eye exhibits no discharge. No scleral icterus.  Neck: Neck supple. No JVD present. No tracheal deviation present. No thyromegaly present.  Cardiovascular: Normal rate, regular rhythm, normal heart sounds and intact distal pulses.  Exam reveals no gallop and no friction rub.   No murmur heard. EKG normal   Pulmonary/Chest: Effort normal and breath sounds normal. No respiratory distress. He has no wheezes. He has no rales. He exhibits no tenderness.  Abdominal: Soft. Bowel sounds are normal. He exhibits no distension and no mass. There is no tenderness. There is no rebound and no guarding.  Genitourinary: Rectum normal, prostate normal and penis normal. Guaiac negative stool. No penile tenderness.  Musculoskeletal: Normal range of motion. He exhibits no edema and no tenderness.  Lymphadenopathy:    He has no cervical adenopathy.  Neurological: He is alert and oriented to person, place, and time. He has normal  reflexes. No cranial nerve deficit. He exhibits normal muscle tone. Coordination normal.  Skin: Skin is warm and dry. No rash noted. He is not diaphoretic. No erythema. No pallor.  Psychiatric: He has a normal mood and affect. His behavior is normal. Judgment and thought content normal.          Assessment & Plan:  Well exam. Get fasting labs

## 2012-11-21 NOTE — Progress Notes (Signed)
Quick Note:  I spoke with pt ______ 

## 2012-11-28 ENCOUNTER — Telehealth: Payer: Self-pay | Admitting: Family Medicine

## 2012-11-28 NOTE — Telephone Encounter (Signed)
I spoke with pt and gave results.  

## 2012-11-28 NOTE — Telephone Encounter (Signed)
Pt states he cannot see his PSA results on MyChart. Can you make those visible to pt, or call him w/ results? Thanks!

## 2013-01-07 ENCOUNTER — Other Ambulatory Visit: Payer: Self-pay | Admitting: Family Medicine

## 2013-03-06 ENCOUNTER — Other Ambulatory Visit: Payer: Self-pay | Admitting: Orthopaedic Surgery

## 2013-03-06 DIAGNOSIS — M25569 Pain in unspecified knee: Secondary | ICD-10-CM | POA: Diagnosis not present

## 2013-03-06 DIAGNOSIS — R29898 Other symptoms and signs involving the musculoskeletal system: Secondary | ICD-10-CM | POA: Diagnosis not present

## 2013-03-06 DIAGNOSIS — M9420 Chondromalacia, unspecified site: Secondary | ICD-10-CM | POA: Diagnosis not present

## 2013-03-06 DIAGNOSIS — I10 Essential (primary) hypertension: Secondary | ICD-10-CM | POA: Diagnosis not present

## 2013-03-07 ENCOUNTER — Ambulatory Visit
Admission: RE | Admit: 2013-03-07 | Discharge: 2013-03-07 | Disposition: A | Discharge status: Home / Self Care | Payer: Medicare Other | Source: Ambulatory Visit | Attending: Orthopaedic Surgery | Admitting: Orthopaedic Surgery

## 2013-03-07 DIAGNOSIS — D1739 Benign lipomatous neoplasm of skin and subcutaneous tissue of other sites: Secondary | ICD-10-CM | POA: Diagnosis not present

## 2013-03-07 MED ORDER — GADOBENATE DIMEGLUMINE 529 MG/ML IV SOLN: 10.0000 mL | Freq: Once | INTRAVENOUS | Status: AC | PRN

## 2013-03-08 DIAGNOSIS — Z23 Encounter for immunization: Secondary | ICD-10-CM | POA: Diagnosis not present

## 2013-04-10 ENCOUNTER — Other Ambulatory Visit: Payer: Self-pay | Admitting: Family Medicine

## 2013-04-10 ENCOUNTER — Telehealth: Payer: Self-pay | Admitting: Family Medicine

## 2013-04-10 MED ORDER — ATENOLOL-CHLORTHALIDONE 50-25 MG PO TABS: 1.0000 | ORAL_TABLET | Freq: Two times a day (BID) | ORAL | Status: DC

## 2013-04-10 NOTE — Telephone Encounter (Signed)
Refill request for Tenoretic and send to [facility], which I did send e-scribe.

## 2013-04-11 ENCOUNTER — Other Ambulatory Visit: Payer: Self-pay

## 2013-07-05 ENCOUNTER — Encounter: Payer: Self-pay | Admitting: Family Medicine

## 2013-07-05 ENCOUNTER — Telehealth: Payer: Self-pay | Admitting: Family Medicine

## 2013-07-05 ENCOUNTER — Ambulatory Visit (INDEPENDENT_AMBULATORY_CARE_PROVIDER_SITE_OTHER): Payer: Medicare Other | Admitting: Family Medicine

## 2013-07-05 VITALS — BP 160/88 | HR 92 | Temp 98.0°F | Ht 66.25 in | Wt 234.0 lb

## 2013-07-05 DIAGNOSIS — J209 Acute bronchitis, unspecified: Secondary | ICD-10-CM | POA: Diagnosis not present

## 2013-07-05 MED ORDER — HYDROCODONE-HOMATROPINE 5-1.5 MG/5ML PO SYRP: 5.0000 mL | ORAL_SOLUTION | ORAL | Status: DC | PRN

## 2013-07-05 MED ORDER — AZITHROMYCIN 250 MG PO TABS: ORAL_TABLET | ORAL | Status: DC

## 2013-07-05 NOTE — Progress Notes (Signed)
Pre visit review using our clinic review tool, if applicable. No additional management support is needed unless otherwise documented below in the visit note. 

## 2013-07-05 NOTE — Telephone Encounter (Signed)
Noted  

## 2013-07-05 NOTE — Telephone Encounter (Signed)
Patient Information:  Caller Name: Albie  Phone: [phone]  Patient: Robert [name], Robert [name]  Gender: Male  DOB: [birth year]  Age: 73 Years  PCP: [name] [facility])  Office Follow Up:  Does the office need to follow up with this patient?: No  Instructions For The Office: [name]  RN Note:  Will obtain an appt. for this pt. To see Dr. [name] today at 11:30 am.  Symptoms  Reason For Call & Symptoms: Day 5 of an URI that has moved into the chest. Cough is worse at night. No wheezing.  Reviewed Health History In EMR: Yes  Reviewed Medications In EMR: Yes  Reviewed Allergies In EMR: Yes  Reviewed Surgeries / Procedures: Yes  Date of Onset of Symptoms: 07/01/2013  Treatments Tried: Alka Seltzer  Treatments Tried Worked: Yes  Guideline(s) Used:  Cough  Disposition Per Guideline:   See Today or Tomorrow in Office  Reason For Disposition Reached:   Continuous (nonstop) coughing interferes with work or school and no improvement using cough treatment per Care Advice  Advice Given:  Call Back If:  Difficulty breathing  You become worse.  Patient Will Follow Care Advice:  YES  Appointment Scheduled:  07/05/2013 11:30:00 Appointment Scheduled Provider:  [name] [facility])

## 2013-07-05 NOTE — Progress Notes (Signed)
   Subjective:    Patient ID: Robert [name], male    DOB: November 26, 1940, 73 y.o.   MRN: [id number]  HPI Here for 5 days of stuffy head, PND, ST and coughing up green sputum. No fever.    Review of Systems  Constitutional: Negative.   HENT: Positive for congestion and postnasal drip.   Eyes: Negative.   Respiratory: Positive for cough.        Objective:   Physical Exam  Constitutional: He appears well-developed and well-nourished.  HENT:  Right Ear: External ear normal.  Left Ear: External ear normal.  Nose: Nose normal.  Mouth/Throat: Oropharynx is clear and moist. No oropharyngeal exudate.  Eyes: Conjunctivae are normal.  Pulmonary/Chest: Effort normal and breath sounds normal.  Lymphadenopathy:    He has no cervical adenopathy.          Assessment & Plan:  Add Mucinex

## 2013-07-29 ENCOUNTER — Encounter: Payer: Self-pay | Admitting: Family Medicine

## 2013-07-29 ENCOUNTER — Ambulatory Visit (INDEPENDENT_AMBULATORY_CARE_PROVIDER_SITE_OTHER): Payer: Medicare Other | Admitting: Family Medicine

## 2013-07-29 VITALS — BP 150/86 | HR 69 | Temp 98.7°F | Ht 66.25 in | Wt 234.0 lb

## 2013-07-29 DIAGNOSIS — J019 Acute sinusitis, unspecified: Secondary | ICD-10-CM | POA: Diagnosis not present

## 2013-07-29 MED ORDER — AMOXICILLIN-POT CLAVULANATE 875-125 MG PO TABS: 1.0000 | ORAL_TABLET | Freq: Two times a day (BID) | ORAL | Status: DC

## 2013-07-29 NOTE — Progress Notes (Signed)
Pre visit review using our clinic review tool, if applicable. No additional management support is needed unless otherwise documented below in the visit note. 

## 2013-07-29 NOTE — Progress Notes (Signed)
   Subjective:    Patient ID: Robert [name], male    DOB: [birth year], 73 y.o.   MRN: [id number]  HPI Here for one week of sinus pressure and PND. Some dry coughing. No fever. He took a Zpack last month for a bronchitis and this helped at that time.    Review of Systems  Constitutional: Negative.   HENT: Positive for congestion, postnasal drip and sinus pressure.   Eyes: Negative.   Respiratory: Negative.        Objective:   Physical Exam  Constitutional: He appears well-developed and well-nourished.  HENT:  Right Ear: External ear normal.  Left Ear: External ear normal.  Nose: Nose normal.  Mouth/Throat: Oropharynx is clear and moist.  Eyes: Conjunctivae are normal.  Pulmonary/Chest: Effort normal and breath sounds normal.  Lymphadenopathy:    He has no cervical adenopathy.          Assessment & Plan:  Add Mucinex

## 2013-10-22 ENCOUNTER — Other Ambulatory Visit: Payer: Self-pay | Admitting: Family Medicine

## 2013-10-22 NOTE — Telephone Encounter (Signed)
Does pt need the potassium level checked?

## 2013-11-11 ENCOUNTER — Encounter: Payer: Self-pay | Admitting: Family Medicine

## 2013-11-11 ENCOUNTER — Ambulatory Visit (INDEPENDENT_AMBULATORY_CARE_PROVIDER_SITE_OTHER): Payer: Medicare Other | Admitting: Family Medicine

## 2013-11-11 VITALS — BP 180/88 | HR 65 | Temp 99.0°F | Ht 66.25 in | Wt 230.0 lb

## 2013-11-11 DIAGNOSIS — J209 Acute bronchitis, unspecified: Secondary | ICD-10-CM

## 2013-11-11 MED ORDER — HYDROCODONE-HOMATROPINE 5-1.5 MG/5ML PO SYRP: 5.0000 mL | ORAL_SOLUTION | ORAL | Status: DC | PRN

## 2013-11-11 MED ORDER — AMOXICILLIN-POT CLAVULANATE 875-125 MG PO TABS: 1.0000 | ORAL_TABLET | Freq: Two times a day (BID) | ORAL | Status: DC

## 2013-11-11 NOTE — Progress Notes (Signed)
   Subjective:    Patient ID: Robert [name], male    DOB: [birth year], 73 y.o.   MRN: [id number]  HPI Here for 4 days of chest tightness and coughing up green sputum. No fever.    Review of Systems  Constitutional: Negative.   HENT: Positive for congestion and postnasal drip. Negative for sinus pressure.   Respiratory: Positive for cough and chest tightness.        Objective:   Physical Exam  Constitutional: He appears well-developed and well-nourished.  HENT:  Right Ear: External ear normal.  Left Ear: External ear normal.  Nose: Nose normal.  Mouth/Throat: Oropharynx is clear and moist.  Eyes: Conjunctivae are normal.  Pulmonary/Chest: Effort normal. He has no rales.  Diffuse wheezes and rhonchi   Lymphadenopathy:    He has no cervical adenopathy.          Assessment & Plan:  Recheck prn

## 2013-11-11 NOTE — Progress Notes (Signed)
Pre visit review using our clinic review tool, if applicable. No additional management support is needed unless otherwise documented below in the visit note. 

## 2013-12-09 ENCOUNTER — Ambulatory Visit (INDEPENDENT_AMBULATORY_CARE_PROVIDER_SITE_OTHER): Payer: Medicare Other | Admitting: Family Medicine

## 2013-12-09 ENCOUNTER — Encounter: Payer: Self-pay | Admitting: Family Medicine

## 2013-12-09 VITALS — BP 157/84 | HR 57 | Temp 98.5°F | Ht 66.25 in | Wt 224.0 lb

## 2013-12-09 DIAGNOSIS — R609 Edema, unspecified: Secondary | ICD-10-CM

## 2013-12-09 DIAGNOSIS — I2510 Atherosclerotic heart disease of native coronary artery without angina pectoris: Secondary | ICD-10-CM | POA: Diagnosis not present

## 2013-12-09 DIAGNOSIS — E669 Obesity, unspecified: Secondary | ICD-10-CM

## 2013-12-09 DIAGNOSIS — N138 Other obstructive and reflux uropathy: Secondary | ICD-10-CM

## 2013-12-09 DIAGNOSIS — F528 Other sexual dysfunction not due to a substance or known physiological condition: Secondary | ICD-10-CM

## 2013-12-09 DIAGNOSIS — I10 Essential (primary) hypertension: Secondary | ICD-10-CM

## 2013-12-09 DIAGNOSIS — E785 Hyperlipidemia, unspecified: Secondary | ICD-10-CM

## 2013-12-09 DIAGNOSIS — N401 Enlarged prostate with lower urinary tract symptoms: Secondary | ICD-10-CM | POA: Diagnosis not present

## 2013-12-09 LAB — TSH: TSH: 3.14 u[IU]/mL (ref 0.35–4.50)

## 2013-12-09 LAB — CBC WITH DIFFERENTIAL/PLATELET
BASOS ABS: 0 10*3/uL (ref 0.0–0.1)
Basophils Relative: 0.3 % (ref 0.0–3.0)
Eosinophils Absolute: 0.4 10*3/uL (ref 0.0–0.7)
Eosinophils Relative: 4.9 % (ref 0.0–5.0)
HEMATOCRIT: 48 % (ref 39.0–52.0)
Hemoglobin: 16.1 g/dL (ref 13.0–17.0)
LYMPHS ABS: 1.8 10*3/uL (ref 0.7–4.0)
Lymphocytes Relative: 20.8 % (ref 12.0–46.0)
MCHC: 33.5 g/dL (ref 30.0–36.0)
MCV: 94.8 fl (ref 78.0–100.0)
MONO ABS: 0.5 10*3/uL (ref 0.1–1.0)
Monocytes Relative: 5.8 % (ref 3.0–12.0)
Neutro Abs: 5.8 10*3/uL (ref 1.4–7.7)
Neutrophils Relative %: 68.2 % (ref 43.0–77.0)
Platelets: 263 10*3/uL (ref 150.0–400.0)
RBC: 5.07 Mil/uL (ref 4.22–5.81)
RDW: 14 % (ref 11.5–15.5)
WBC: 8.5 10*3/uL (ref 4.0–10.5)

## 2013-12-09 LAB — LIPID PANEL
CHOLESTEROL: 151 mg/dL (ref 0–200)
HDL: 34.6 mg/dL — ABNORMAL LOW (ref >39.00)
LDL Cholesterol: 82 mg/dL (ref 0–99)
NonHDL: 116.4
Total CHOL/HDL Ratio: 4
Triglycerides: 174 mg/dL — ABNORMAL HIGH (ref 0.0–149.0)
VLDL: 34.8 mg/dL (ref 0.0–40.0)

## 2013-12-09 LAB — BASIC METABOLIC PANEL
BUN: 20 mg/dL (ref 6–23)
CALCIUM: 9.9 mg/dL (ref 8.4–10.5)
CO2: 29 mEq/L (ref 19–32)
Chloride: 98 mEq/L (ref 96–112)
Creatinine, Ser: 1 mg/dL (ref 0.4–1.5)
GFR: 80.1 mL/min (ref >60.00)
Glucose, Bld: 137 mg/dL — ABNORMAL HIGH (ref 70–99)
Potassium: 4.2 mEq/L (ref 3.5–5.1)
SODIUM: 139 meq/L (ref 135–145)

## 2013-12-09 LAB — POCT URINALYSIS DIPSTICK
Bilirubin, UA: NEGATIVE
Glucose, UA: NEGATIVE
KETONES UA: NEGATIVE
Leukocytes, UA: NEGATIVE
Nitrite, UA: NEGATIVE
RBC UA: NEGATIVE
SPEC GRAV UA: 1.015
Urobilinogen, UA: 2
pH, UA: 8

## 2013-12-09 LAB — HEPATIC FUNCTION PANEL
ALK PHOS: 70 U/L (ref 39–117)
ALT: 39 U/L (ref 0–53)
AST: 30 U/L (ref 0–37)
Albumin: 4.2 g/dL (ref 3.5–5.2)
Bilirubin, Direct: 0.2 mg/dL (ref 0.0–0.3)
Total Bilirubin: 0.8 mg/dL (ref 0.2–1.2)
Total Protein: 7.2 g/dL (ref 6.0–8.3)

## 2013-12-09 LAB — PSA: PSA: 2.67 ng/mL (ref 0.10–4.00)

## 2013-12-09 MED ORDER — ATORVASTATIN CALCIUM 40 MG PO TABS: 40.0000 mg | ORAL_TABLET | Freq: Every day | ORAL | Status: DC

## 2013-12-09 MED ORDER — ATENOLOL-CHLORTHALIDONE 50-25 MG PO TABS: 1.0000 | ORAL_TABLET | Freq: Two times a day (BID) | ORAL | Status: DC

## 2013-12-09 MED ORDER — LOSARTAN POTASSIUM 100 MG PO TABS: ORAL_TABLET | ORAL | Status: DC

## 2013-12-09 MED ORDER — POTASSIUM CHLORIDE ER 10 MEQ PO TBCR: EXTENDED_RELEASE_TABLET | ORAL | Status: DC

## 2013-12-09 NOTE — Progress Notes (Signed)
   Subjective:    Patient ID: Robert [name], male    DOB: Jun 22, 1940, 73 y.o.   MRN: [id number]  HPI 73 yr old male for a cpx. He feels well in general. He has retired but he tries to stay busy.    Review of Systems  Constitutional: Negative.   HENT: Negative.   Eyes: Negative.   Respiratory: Negative.   Cardiovascular: Negative.   Gastrointestinal: Negative.   Genitourinary: Negative.   Musculoskeletal: Negative.   Skin: Negative.   Neurological: Negative.   Psychiatric/Behavioral: Negative.        Objective:   Physical Exam  Constitutional: He is oriented to person, place, and time. He appears well-developed and well-nourished. No distress.  HENT:  Head: Normocephalic and atraumatic.  Right Ear: External ear normal.  Left Ear: External ear normal.  Nose: Nose normal.  Mouth/Throat: Oropharynx is clear and moist. No oropharyngeal exudate.  Eyes: Conjunctivae and EOM are normal. Pupils are equal, round, and reactive to light. Right eye exhibits no discharge. Left eye exhibits no discharge. No scleral icterus.  Neck: Neck supple. No JVD present. No tracheal deviation present. No thyromegaly present.  Cardiovascular: Normal rate, regular rhythm, normal heart sounds and intact distal pulses.  Exam reveals no gallop and no friction rub.   No murmur heard. EKG normal   Pulmonary/Chest: Effort normal and breath sounds normal. No respiratory distress. He has no wheezes. He has no rales. He exhibits no tenderness.  Abdominal: Soft. Bowel sounds are normal. He exhibits no distension and no mass. There is no tenderness. There is no rebound and no guarding.  Genitourinary: Rectum normal, prostate normal and penis normal. Guaiac negative stool. No penile tenderness.  Musculoskeletal: Normal range of motion. He exhibits no edema and no tenderness.  Lymphadenopathy:    He has no cervical adenopathy.  Neurological: He is alert and oriented to person, place, and time. He has normal  reflexes. No cranial nerve deficit. He exhibits normal muscle tone. Coordination normal.  Skin: Skin is warm and dry. No rash noted. He is not diaphoretic. No erythema. No pallor.  Psychiatric: He has a normal mood and affect. His behavior is normal. Judgment and thought content normal.          Assessment & Plan:  Well exam. Get labs

## 2013-12-09 NOTE — Progress Notes (Signed)
Pre visit review using our clinic review tool, if applicable. No additional management support is needed unless otherwise documented below in the visit note. 

## 2013-12-10 ENCOUNTER — Telehealth: Payer: Self-pay | Admitting: Family Medicine

## 2013-12-10 NOTE — Telephone Encounter (Signed)
Relevant patient education assigned to patient using Emmi. ° °

## 2013-12-10 NOTE — Telephone Encounter (Signed)
Pt is needing lab results that were done on yesterday

## 2013-12-12 NOTE — Telephone Encounter (Signed)
See report

## 2013-12-16 ENCOUNTER — Telehealth: Payer: Self-pay | Admitting: Family Medicine

## 2013-12-16 NOTE — Telephone Encounter (Signed)
PA has been submitted, ref# [id number]

## 2013-12-16 NOTE — Telephone Encounter (Signed)
[facility] denied 2 per day Atenolol/Chlorthalidone.  Pt's plan only allows 1 tablet per day.  I will send the letter to to review before it goes to scanning.

## 2013-12-16 NOTE — Telephone Encounter (Signed)
Pt states [facility] needs prior auth of atenolol-chlorthalidone (TENORETIC) 50-25 MG per tablet Pt has been taking since 1986 but dr increased this last visit. [facility] [phone]

## 2013-12-16 NOTE — Telephone Encounter (Signed)
[facility] denied the PA.  Pt is aware and I will send a message to his MD

## 2013-12-16 NOTE — Telephone Encounter (Signed)
Pt states [facility] needs prior auth of atenolol-chlorthalidone (TENORETIC) 50-25 MG per tablet  Pt has been taking since 1986 but dr increased this last visit.  [facility] [phone]

## 2013-12-17 NOTE — Telephone Encounter (Signed)
Change the rx to take only once a day, #90 with 3 rf

## 2013-12-18 MED ORDER — ATENOLOL-CHLORTHALIDONE 50-25 MG PO TABS: 1.0000 | ORAL_TABLET | Freq: Every day | ORAL | Status: DC

## 2013-12-18 NOTE — Telephone Encounter (Signed)
I sent new script e-scribe and left a detailed voice message for pt.

## 2013-12-27 ENCOUNTER — Encounter: Payer: Self-pay | Admitting: Gastroenterology

## 2014-03-10 ENCOUNTER — Telehealth: Payer: Self-pay | Admitting: Family Medicine

## 2014-03-10 DIAGNOSIS — E119 Type 2 diabetes mellitus without complications: Secondary | ICD-10-CM

## 2014-03-10 NOTE — Telephone Encounter (Signed)
Per dr fry instructs from a 7/6 appt w/ labs, pt was to have labs rechecked in 90 days. Can you put the orders in?

## 2014-03-10 NOTE — Telephone Encounter (Signed)
I ordered the labs, he needs to schedule a draw

## 2014-03-10 NOTE — Telephone Encounter (Signed)
Can we order A1c and if so what code to use?

## 2014-03-11 NOTE — Telephone Encounter (Signed)
Done

## 2014-03-27 ENCOUNTER — Other Ambulatory Visit (INDEPENDENT_AMBULATORY_CARE_PROVIDER_SITE_OTHER): Payer: Medicare Other

## 2014-03-27 DIAGNOSIS — E119 Type 2 diabetes mellitus without complications: Secondary | ICD-10-CM

## 2014-03-27 LAB — MICROALBUMIN / CREATININE URINE RATIO
Creatinine,U: 262.8 mg/dL
Microalb Creat Ratio: 0.8 mg/g (ref 0.0–30.0)
Microalb, Ur: 2.1 mg/dL — ABNORMAL HIGH (ref 0.0–1.9)

## 2014-03-27 LAB — HEMOGLOBIN A1C: HEMOGLOBIN A1C: 6 % (ref 4.6–6.5)

## 2014-03-31 ENCOUNTER — Telehealth: Payer: Self-pay | Admitting: Family Medicine

## 2014-03-31 NOTE — Telephone Encounter (Signed)
Pt anxious to hear about lab results form last Thurs.

## 2014-04-01 NOTE — Telephone Encounter (Signed)
I tried to reach pt by phone and no answer, however I did release normal results in my chart.

## 2015-01-06 ENCOUNTER — Encounter: Payer: Self-pay | Admitting: Family Medicine

## 2015-01-06 ENCOUNTER — Ambulatory Visit (INDEPENDENT_AMBULATORY_CARE_PROVIDER_SITE_OTHER): Payer: Medicare Other | Admitting: Family Medicine

## 2015-01-06 VITALS — BP 169/81 | HR 55 | Temp 98.4°F | Ht 66.25 in | Wt 214.0 lb

## 2015-01-06 DIAGNOSIS — N401 Enlarged prostate with lower urinary tract symptoms: Secondary | ICD-10-CM | POA: Diagnosis not present

## 2015-01-06 DIAGNOSIS — F528 Other sexual dysfunction not due to a substance or known physiological condition: Secondary | ICD-10-CM | POA: Diagnosis not present

## 2015-01-06 DIAGNOSIS — E119 Type 2 diabetes mellitus without complications: Secondary | ICD-10-CM | POA: Diagnosis not present

## 2015-01-06 DIAGNOSIS — I10 Essential (primary) hypertension: Secondary | ICD-10-CM

## 2015-01-06 DIAGNOSIS — N138 Other obstructive and reflux uropathy: Secondary | ICD-10-CM

## 2015-01-06 DIAGNOSIS — E785 Hyperlipidemia, unspecified: Secondary | ICD-10-CM

## 2015-01-06 DIAGNOSIS — I2510 Atherosclerotic heart disease of native coronary artery without angina pectoris: Secondary | ICD-10-CM

## 2015-01-06 LAB — CBC WITH DIFFERENTIAL/PLATELET
BASOS ABS: 0 10*3/uL (ref 0.0–0.1)
Basophils Relative: 0.3 % (ref 0.0–3.0)
EOS ABS: 0.2 10*3/uL (ref 0.0–0.7)
Eosinophils Relative: 2.6 % (ref 0.0–5.0)
HEMATOCRIT: 49.6 % (ref 39.0–52.0)
Hemoglobin: 16.5 g/dL (ref 13.0–17.0)
LYMPHS ABS: 1.7 10*3/uL (ref 0.7–4.0)
LYMPHS PCT: 19.7 % (ref 12.0–46.0)
MCHC: 33.2 g/dL (ref 30.0–36.0)
MCV: 95.4 fl (ref 78.0–100.0)
MONO ABS: 0.5 10*3/uL (ref 0.1–1.0)
Monocytes Relative: 5.2 % (ref 3.0–12.0)
Neutro Abs: 6.4 10*3/uL (ref 1.4–7.7)
Neutrophils Relative %: 72.2 % (ref 43.0–77.0)
Platelets: 215 10*3/uL (ref 150.0–400.0)
RBC: 5.2 Mil/uL (ref 4.22–5.81)
RDW: 13.8 % (ref 11.5–15.5)
WBC: 8.8 10*3/uL (ref 4.0–10.5)

## 2015-01-06 LAB — POCT URINALYSIS DIPSTICK
Bilirubin, UA: NEGATIVE
Blood, UA: NEGATIVE
GLUCOSE UA: NEGATIVE
Ketones, UA: NEGATIVE
LEUKOCYTES UA: NEGATIVE
NITRITE UA: NEGATIVE
SPEC GRAV UA: 1.015
Urobilinogen, UA: 1
pH, UA: 8.5

## 2015-01-06 LAB — BASIC METABOLIC PANEL
BUN: 15 mg/dL (ref 6–23)
CALCIUM: 9.7 mg/dL (ref 8.4–10.5)
CO2: 32 mEq/L (ref 19–32)
Chloride: 97 mEq/L (ref 96–112)
Creatinine, Ser: 0.83 mg/dL (ref 0.40–1.50)
GFR: 96.74 mL/min (ref >60.00)
Glucose, Bld: 112 mg/dL — ABNORMAL HIGH (ref 70–99)
POTASSIUM: 4.4 meq/L (ref 3.5–5.1)
SODIUM: 137 meq/L (ref 135–145)

## 2015-01-06 LAB — HEPATIC FUNCTION PANEL
ALBUMIN: 4.4 g/dL (ref 3.5–5.2)
ALT: 29 U/L (ref 0–53)
AST: 23 U/L (ref 0–37)
Alkaline Phosphatase: 69 U/L (ref 39–117)
BILIRUBIN TOTAL: 0.8 mg/dL (ref 0.2–1.2)
Bilirubin, Direct: 0.2 mg/dL (ref 0.0–0.3)
Total Protein: 6.8 g/dL (ref 6.0–8.3)

## 2015-01-06 LAB — LIPID PANEL
CHOL/HDL RATIO: 4
CHOLESTEROL: 133 mg/dL (ref 0–200)
HDL: 31.6 mg/dL — AB (ref >39.00)
LDL Cholesterol: 73 mg/dL (ref 0–99)
NonHDL: 101.44
Triglycerides: 144 mg/dL (ref 0.0–149.0)
VLDL: 28.8 mg/dL (ref 0.0–40.0)

## 2015-01-06 LAB — TSH: TSH: 2.51 u[IU]/mL (ref 0.35–4.50)

## 2015-01-06 LAB — HEMOGLOBIN A1C: Hgb A1c MFr Bld: 5.8 % (ref 4.6–6.5)

## 2015-01-06 LAB — PSA: PSA: 3.59 ng/mL (ref 0.10–4.00)

## 2015-01-06 MED ORDER — POTASSIUM CHLORIDE ER 10 MEQ PO TBCR: EXTENDED_RELEASE_TABLET | ORAL | Status: DC

## 2015-01-06 MED ORDER — ATORVASTATIN CALCIUM 40 MG PO TABS: 40.0000 mg | ORAL_TABLET | Freq: Every day | ORAL | Status: DC

## 2015-01-06 MED ORDER — LOSARTAN POTASSIUM 100 MG PO TABS: ORAL_TABLET | ORAL | Status: DC

## 2015-01-06 MED ORDER — ATENOLOL-CHLORTHALIDONE 50-25 MG PO TABS: 1.0000 | ORAL_TABLET | Freq: Every day | ORAL | Status: DC

## 2015-01-06 NOTE — Progress Notes (Signed)
Pre visit review using our clinic review tool, if applicable. No additional management support is needed unless otherwise documented below in the visit note. 

## 2015-01-06 NOTE — Progress Notes (Signed)
   Subjective:    Patient ID: Robert [name], male    DOB: June 02, 1941, 74 y.o.   MRN: [id number]  HPI 74 yr old male to follow up on issues. His BP is stable at home, averaging 130/80. He has eliminated a lot of carbs from his diet since we diagnosed him with diabetes last year, and he has lost some weight. He feels well.    Review of Systems  Constitutional: Negative.   HENT: Negative.   Eyes: Negative.   Respiratory: Negative.   Cardiovascular: Negative.   Gastrointestinal: Negative.   Genitourinary: Negative.   Musculoskeletal: Negative.   Skin: Negative.   Neurological: Negative.   Psychiatric/Behavioral: Negative.        Objective:   Physical Exam  Constitutional: He is oriented to person, place, and time. He appears well-developed and well-nourished. No distress.  HENT:  Head: Normocephalic and atraumatic.  Right Ear: External ear normal.  Left Ear: External ear normal.  Nose: Nose normal.  Mouth/Throat: Oropharynx is clear and moist. No oropharyngeal exudate.  Eyes: Conjunctivae and EOM are normal. Pupils are equal, round, and reactive to light. Right eye exhibits no discharge. Left eye exhibits no discharge. No scleral icterus.  Neck: Neck supple. No JVD present. No tracheal deviation present. No thyromegaly present.  Cardiovascular: Normal rate, regular rhythm, normal heart sounds and intact distal pulses.  Exam reveals no gallop and no friction rub.   No murmur heard. EKG normal   Pulmonary/Chest: Effort normal and breath sounds normal. No respiratory distress. He has no wheezes. He has no rales. He exhibits no tenderness.  Abdominal: Soft. Bowel sounds are normal. He exhibits no distension and no mass. There is no tenderness. There is no rebound and no guarding.  Genitourinary: Rectum normal, prostate normal and penis normal. Guaiac negative stool. No penile tenderness.  Musculoskeletal: Normal range of motion. He exhibits no edema or tenderness.    Lymphadenopathy:    He has no cervical adenopathy.  Neurological: He is alert and oriented to person, place, and time. He has normal reflexes. No cranial nerve deficit. He exhibits normal muscle tone. Coordination normal.  Skin: Skin is warm and dry. No rash noted. He is not diaphoretic. No erythema. No pallor.  Psychiatric: He has a normal mood and affect. His behavior is normal. Judgment and thought content normal.          Assessment & Plan:  His HTN is stable and his type 2 diabetes is probably well controlled. He will get fasting labs today including an A1c. We discussed diet and exercise advice. He recommended he set up an eye exam soon.

## 2015-06-05 DIAGNOSIS — Z23 Encounter for immunization: Secondary | ICD-10-CM | POA: Diagnosis not present

## 2015-07-08 HISTORY — PX: OTHER SURGICAL HISTORY: SHX169

## 2015-09-03 ENCOUNTER — Other Ambulatory Visit: Payer: Self-pay | Admitting: Family Medicine

## 2015-09-03 NOTE — Telephone Encounter (Signed)
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

## 2015-09-04 ENCOUNTER — Other Ambulatory Visit: Payer: Self-pay

## 2015-09-04 NOTE — Telephone Encounter (Signed)
Patient was last seen 01/06/15 - ok to refill?

## 2015-09-04 NOTE — Telephone Encounter (Signed)
Voicemail was left for pt to return my call. Unable to find the pharmacy

## 2015-09-04 NOTE — Telephone Encounter (Signed)
Refill both for a year

## 2015-09-07 MED ORDER — LOSARTAN POTASSIUM 100 MG PO TABS: ORAL_TABLET | ORAL | Status: DC

## 2015-09-07 MED ORDER — ATENOLOL-CHLORTHALIDONE 50-25 MG PO TABS: 1.0000 | ORAL_TABLET | Freq: Every day | ORAL | Status: DC

## 2015-09-07 NOTE — Telephone Encounter (Signed)
Rx were sent to the pharmacy

## 2015-09-07 NOTE — Telephone Encounter (Signed)
The phone number  Blase Mess    [phone]

## 2015-11-25 DIAGNOSIS — R2231 Localized swelling, mass and lump, right upper limb: Secondary | ICD-10-CM | POA: Diagnosis not present

## 2015-12-03 ENCOUNTER — Other Ambulatory Visit: Payer: Self-pay | Admitting: Orthopaedic Surgery

## 2015-12-03 DIAGNOSIS — D1739 Benign lipomatous neoplasm of skin and subcutaneous tissue of other sites: Secondary | ICD-10-CM | POA: Diagnosis not present

## 2015-12-03 DIAGNOSIS — R2231 Localized swelling, mass and lump, right upper limb: Secondary | ICD-10-CM | POA: Diagnosis not present

## 2016-02-22 ENCOUNTER — Ambulatory Visit (INDEPENDENT_AMBULATORY_CARE_PROVIDER_SITE_OTHER): Payer: PPO | Admitting: Family Medicine

## 2016-02-22 ENCOUNTER — Encounter: Payer: Self-pay | Admitting: Family Medicine

## 2016-02-22 VITALS — BP 162/86 | HR 56 | Temp 98.0°F | Ht 66.25 in | Wt 215.0 lb

## 2016-02-22 DIAGNOSIS — Z23 Encounter for immunization: Secondary | ICD-10-CM

## 2016-02-22 DIAGNOSIS — R739 Hyperglycemia, unspecified: Secondary | ICD-10-CM

## 2016-02-22 DIAGNOSIS — R972 Elevated prostate specific antigen [PSA]: Secondary | ICD-10-CM

## 2016-02-22 DIAGNOSIS — Z0000 Encounter for general adult medical examination without abnormal findings: Secondary | ICD-10-CM | POA: Diagnosis not present

## 2016-02-22 LAB — HEPATIC FUNCTION PANEL
ALT: 35 U/L (ref 0–53)
AST: 25 U/L (ref 0–37)
Albumin: 4.2 g/dL (ref 3.5–5.2)
Alkaline Phosphatase: 65 U/L (ref 39–117)
BILIRUBIN TOTAL: 0.9 mg/dL (ref 0.2–1.2)
Bilirubin, Direct: 0.2 mg/dL (ref 0.0–0.3)
Total Protein: 6.6 g/dL (ref 6.0–8.3)

## 2016-02-22 LAB — BASIC METABOLIC PANEL
BUN: 20 mg/dL (ref 6–23)
CALCIUM: 9.5 mg/dL (ref 8.4–10.5)
CO2: 33 meq/L — AB (ref 19–32)
CREATININE: 0.87 mg/dL (ref 0.40–1.50)
Chloride: 101 mEq/L (ref 96–112)
GFR: 91.33 mL/min (ref >60.00)
Glucose, Bld: 116 mg/dL — ABNORMAL HIGH (ref 70–99)
Potassium: 3.9 mEq/L (ref 3.5–5.1)
SODIUM: 140 meq/L (ref 135–145)

## 2016-02-22 LAB — CBC WITH DIFFERENTIAL/PLATELET
BASOS ABS: 0 10*3/uL (ref 0.0–0.1)
Basophils Relative: 0.5 % (ref 0.0–3.0)
EOS ABS: 0.3 10*3/uL (ref 0.0–0.7)
Eosinophils Relative: 3.5 % (ref 0.0–5.0)
HCT: 48.7 % (ref 39.0–52.0)
Hemoglobin: 16.3 g/dL (ref 13.0–17.0)
LYMPHS ABS: 1.7 10*3/uL (ref 0.7–4.0)
Lymphocytes Relative: 21.8 % (ref 12.0–46.0)
MCHC: 33.5 g/dL (ref 30.0–36.0)
MCV: 95.1 fl (ref 78.0–100.0)
MONO ABS: 0.5 10*3/uL (ref 0.1–1.0)
Monocytes Relative: 6.2 % (ref 3.0–12.0)
NEUTROS PCT: 68 % (ref 43.0–77.0)
Neutro Abs: 5.2 10*3/uL (ref 1.4–7.7)
Platelets: 222 10*3/uL (ref 150.0–400.0)
RBC: 5.12 Mil/uL (ref 4.22–5.81)
RDW: 13.9 % (ref 11.5–15.5)
WBC: 7.6 10*3/uL (ref 4.0–10.5)

## 2016-02-22 LAB — HEMOGLOBIN A1C: HEMOGLOBIN A1C: 6 % (ref 4.6–6.5)

## 2016-02-22 LAB — POC URINALSYSI DIPSTICK (AUTOMATED)
Bilirubin, UA: NEGATIVE
Glucose, UA: NEGATIVE
KETONES UA: NEGATIVE
Leukocytes, UA: NEGATIVE
Nitrite, UA: NEGATIVE
PH UA: 7.5
SPEC GRAV UA: 1.015
UROBILINOGEN UA: 0.2

## 2016-02-22 LAB — LIPID PANEL
CHOL/HDL RATIO: 5
Cholesterol: 147 mg/dL (ref 0–200)
HDL: 31.8 mg/dL — AB (ref >39.00)
LDL CALC: 86 mg/dL (ref 0–99)
NONHDL: 114.81
Triglycerides: 145 mg/dL (ref 0.0–149.0)
VLDL: 29 mg/dL (ref 0.0–40.0)

## 2016-02-22 LAB — TSH: TSH: 2.17 u[IU]/mL (ref 0.35–4.50)

## 2016-02-22 LAB — PSA: PSA: 4.64 ng/mL — AB (ref 0.10–4.00)

## 2016-02-22 NOTE — Progress Notes (Signed)
Pre visit review using our clinic review tool, if applicable. No additional management support is needed unless otherwise documented below in the visit note. 

## 2016-02-22 NOTE — Progress Notes (Signed)
   Subjective:    Patient ID: Robert [name], male    DOB: 1940/08/19, 75 y.o.   MRN: [id number]  HPI 75 yr old male for a well exam. He feels fine. He and his wife just returned from a [place] cruise. His BP at home runs 130s to 140s over 80s.    Review of Systems  Constitutional: Negative.   HENT: Negative.   Eyes: Negative.   Respiratory: Negative.   Cardiovascular: Negative.   Gastrointestinal: Negative.   Genitourinary: Negative.   Musculoskeletal: Negative.   Skin: Negative.   Neurological: Negative.   Psychiatric/Behavioral: Negative.        Objective:   Physical Exam  Constitutional: He is oriented to person, place, and time. He appears well-developed and well-nourished. No distress.  HENT:  Head: Normocephalic and atraumatic.  Right Ear: External ear normal.  Left Ear: External ear normal.  Nose: Nose normal.  Mouth/Throat: Oropharynx is clear and moist. No oropharyngeal exudate.  Eyes: Conjunctivae and EOM are normal. Pupils are equal, round, and reactive to light. Right eye exhibits no discharge. Left eye exhibits no discharge. No scleral icterus.  Neck: Neck supple. No JVD present. No tracheal deviation present. No thyromegaly present.  Cardiovascular: Normal rate, regular rhythm, normal heart sounds and intact distal pulses.  Exam reveals no gallop and no friction rub.   No murmur heard. EKG normal   Pulmonary/Chest: Effort normal and breath sounds normal. No respiratory distress. He has no wheezes. He has no rales. He exhibits no tenderness.  Abdominal: Soft. Bowel sounds are normal. He exhibits no distension and no mass. There is no tenderness. There is no rebound and no guarding.  Genitourinary: Rectum normal and penis normal. Rectal exam shows guaiac negative stool. No penile tenderness.  Genitourinary Comments: Prostate is normal size, firm without nodules   Musculoskeletal: Normal range of motion. He exhibits no edema or tenderness.  Lymphadenopathy:     He has no cervical adenopathy.  Neurological: He is alert and oriented to person, place, and time. He has normal reflexes. No cranial nerve deficit. He exhibits normal muscle tone. Coordination normal.  Skin: Skin is warm and dry. No rash noted. He is not diaphoretic. No erythema. No pallor.  Psychiatric: He has a normal mood and affect. His behavior is normal. Judgment and thought content normal.          Assessment & Plan:  Well exam. We discussed diet and exercise. Get fasting labs.  Laurey Morale, MD

## 2016-02-26 NOTE — Addendum Note (Signed)
Addended by: [name] A on: 02/26/2016 06:57 AM   Modules accepted: Orders

## 2016-02-29 ENCOUNTER — Telehealth: Payer: Self-pay | Admitting: Family Medicine

## 2016-02-29 NOTE — Telephone Encounter (Signed)
I spoke with pt and went over results. 

## 2016-02-29 NOTE — Telephone Encounter (Signed)
° °  Pt would like a call back. He said he received a call from [facility] and he was not aware of this appt being made and would like to know what is going on   [phone]

## 2016-03-29 ENCOUNTER — Telehealth: Payer: Self-pay | Admitting: Family Medicine

## 2016-03-29 NOTE — Telephone Encounter (Signed)
Pt request refill  atorvastatin (LIPITOR) 40 MG tablet potassium chloride (K-DUR) 10 MEQ tablet  [facility]  90 day

## 2016-03-31 MED ORDER — ATORVASTATIN CALCIUM 40 MG PO TABS: 40.0000 mg | ORAL_TABLET | Freq: Every day | ORAL | 3 refills | Status: DC

## 2016-03-31 MED ORDER — POTASSIUM CHLORIDE ER 10 MEQ PO TBCR: EXTENDED_RELEASE_TABLET | ORAL | 3 refills | Status: DC

## 2016-03-31 NOTE — Telephone Encounter (Signed)
Okay per Dr. [name] to refill for 1 year and I did send both scripts e-scribe to below pharmacy.

## 2016-04-05 ENCOUNTER — Telehealth: Payer: Self-pay | Admitting: Family Medicine

## 2016-04-05 NOTE — Telephone Encounter (Signed)
This is what was ordered on 03/31/2016, I printed a copy of script and faxed to below pharmacy.

## 2016-04-05 NOTE — Telephone Encounter (Signed)
[facility] called to clarify Rx potassium chloride (K-DUR) 10 MEQ tablet  They would like to know if you need the generic, potassium chloride  (wax coated) Or the name brand K-DUR. (micro dispersible)  They cannot fill as it is written, please call back.

## 2016-05-04 DIAGNOSIS — R351 Nocturia: Secondary | ICD-10-CM | POA: Diagnosis not present

## 2016-05-04 DIAGNOSIS — R972 Elevated prostate specific antigen [PSA]: Secondary | ICD-10-CM | POA: Diagnosis not present

## 2016-05-04 DIAGNOSIS — N403 Nodular prostate with lower urinary tract symptoms: Secondary | ICD-10-CM | POA: Diagnosis not present

## 2016-06-15 DIAGNOSIS — R972 Elevated prostate specific antigen [PSA]: Secondary | ICD-10-CM | POA: Diagnosis not present

## 2016-06-15 HISTORY — PX: PROSTATE BIOPSY: SHX241

## 2016-06-23 DIAGNOSIS — R351 Nocturia: Secondary | ICD-10-CM | POA: Diagnosis not present

## 2016-06-23 DIAGNOSIS — N5201 Erectile dysfunction due to arterial insufficiency: Secondary | ICD-10-CM | POA: Diagnosis not present

## 2016-06-23 DIAGNOSIS — N403 Nodular prostate with lower urinary tract symptoms: Secondary | ICD-10-CM | POA: Diagnosis not present

## 2016-06-23 DIAGNOSIS — C61 Malignant neoplasm of prostate: Secondary | ICD-10-CM | POA: Diagnosis not present

## 2016-06-24 ENCOUNTER — Encounter: Payer: Self-pay | Admitting: Radiation Oncology

## 2016-07-07 ENCOUNTER — Encounter: Payer: Self-pay | Admitting: Radiation Oncology

## 2016-07-07 ENCOUNTER — Ambulatory Visit
Admission: RE | Admit: 2016-07-07 | Discharge: 2016-07-07 | Disposition: A | Discharge status: Home / Self Care | Payer: PPO | Source: Ambulatory Visit | Attending: Radiation Oncology | Admitting: Radiation Oncology

## 2016-07-07 ENCOUNTER — Ambulatory Visit: Payer: PPO | Admitting: Radiation Oncology

## 2016-07-07 VITALS — BP 136/64 | HR 63 | Resp 18 | Ht 66.0 in | Wt 221.8 lb

## 2016-07-07 DIAGNOSIS — E785 Hyperlipidemia, unspecified: Secondary | ICD-10-CM | POA: Insufficient documentation

## 2016-07-07 DIAGNOSIS — Z803 Family history of malignant neoplasm of breast: Secondary | ICD-10-CM | POA: Insufficient documentation

## 2016-07-07 DIAGNOSIS — Z8679 Personal history of other diseases of the circulatory system: Secondary | ICD-10-CM | POA: Diagnosis not present

## 2016-07-07 DIAGNOSIS — Z951 Presence of aortocoronary bypass graft: Secondary | ICD-10-CM | POA: Diagnosis not present

## 2016-07-07 DIAGNOSIS — I2510 Atherosclerotic heart disease of native coronary artery without angina pectoris: Secondary | ICD-10-CM | POA: Diagnosis not present

## 2016-07-07 DIAGNOSIS — Z7982 Long term (current) use of aspirin: Secondary | ICD-10-CM | POA: Insufficient documentation

## 2016-07-07 DIAGNOSIS — E669 Obesity, unspecified: Secondary | ICD-10-CM | POA: Insufficient documentation

## 2016-07-07 DIAGNOSIS — C61 Malignant neoplasm of prostate: Secondary | ICD-10-CM | POA: Diagnosis not present

## 2016-07-07 DIAGNOSIS — Z8041 Family history of malignant neoplasm of ovary: Secondary | ICD-10-CM | POA: Diagnosis not present

## 2016-07-07 DIAGNOSIS — N402 Nodular prostate without lower urinary tract symptoms: Secondary | ICD-10-CM | POA: Insufficient documentation

## 2016-07-07 DIAGNOSIS — Z510 Encounter for antineoplastic radiation therapy: Secondary | ICD-10-CM | POA: Diagnosis not present

## 2016-07-07 DIAGNOSIS — N529 Male erectile dysfunction, unspecified: Secondary | ICD-10-CM | POA: Insufficient documentation

## 2016-07-07 DIAGNOSIS — Z8042 Family history of malignant neoplasm of prostate: Secondary | ICD-10-CM | POA: Diagnosis not present

## 2016-07-07 DIAGNOSIS — I10 Essential (primary) hypertension: Secondary | ICD-10-CM | POA: Diagnosis not present

## 2016-07-07 HISTORY — DX: Malignant neoplasm of prostate: C61

## 2016-07-07 NOTE — Progress Notes (Signed)
Radiation Oncology         [phone] ________________________________  Initial outpatient Consultation  Name: Robert [name] MRN: [id number]  Date: 07/07/2016  DOB: 1940-06-20  [id number] [name], MD  Irine Seal, MD   REFERRING PHYSICIAN: Irine Seal, MD  DIAGNOSIS: 76 year-old gentleman with clinical Stage T2a adenocarcinoma of the prostate, Gleason's Score 3+3, PSA 4.64    ICD-9-CM ICD-10-CM   1. Malignant neoplasm of prostate ([place]) 185 C61     HISTORY OF PRESENT ILLNESS: Robert [name] is a 76 y.o. male with a history of elevated PSA that has been followed by his PCP since 2013.  PSA was 1.57 in 2013 and has been gradually rising since that time. Most recently, he was noted to have an elevated PSA of 4.64 on 02/21/2016 by his primary care physician, Dr. [name].  Accordingly, he was referred for evaluation in urology by Dr. Jeffie Pollock on 05/04/2016,  digital rectal examination was performed at that time revealing an 8 mm nodule in the right base with prostate size estimated to be 1.5+.  In light of his elevated PSA and palpable prostate nodule, the patient proceeded to transrectal ultrasound with 12 biopsies of the prostate on 06/15/2016.  The prostate volume measured 23.4 cc.  Out of 12 core biopsies, 6 were positive with high volume disease (70-80%) in the majority of these .  The maximum Gleason score was 3+3, and this was seen in the left base lateral, left mid lateral, left apex lateral left base, left mid, and left apex.  The patient reviewed the biopsy results with his urologist and he has kindly been referred today for discussion of potential radiation treatment options.     PREVIOUS RADIATION THERAPY: No  PAST MEDICAL HISTORY:  Past Medical History:  Diagnosis Date  . BPH (benign prostatic hypertrophy) with urinary obstruction   . CAD (coronary artery disease)   . ED (erectile dysfunction) of non-organic origin   . Edema   . Hyperlipemia   . Hypertension   . Obesity    . Prostate cancer ([place])       PAST SURGICAL HISTORY: Past Surgical History:  Procedure Laterality Date  . COLONOSCOPY  08-03-07   per Dr. Fuller Plan, clear, repeat in 10 yrs   . CORONARY ARTERY BYPASS GRAFT     triple Dr Lucianne Lei Tright  . PROSTATE BIOPSY      FAMILY HISTORY:  Family History  Problem Relation Age of Onset  . Sudden death    . Cancer Maternal Aunt     ovarian  . Cancer Maternal Uncle     prostate  . Cancer Maternal Grandmother     breast  . Cancer Maternal Grandfather     prostate  . Cancer Maternal Uncle     prostate  . Cancer Maternal Uncle     prostate  . Cancer Maternal Uncle     prostate    SOCIAL HISTORY:  Social History   Social History  . Marital status: Married    Spouse name: [name]  . Number of children: [name]  . Years of education: [name]   Occupational History  . Not on file.   Social History Main Topics  . Smoking status: Never Smoker  . Smokeless tobacco: Never Used  . Alcohol use No  . Drug use: No  . Sexual activity: No   Other Topics Concern  . Not on file   Social History Narrative  . No narrative on file    ALLERGIES:  Patient has no known allergies.  MEDICATIONS:  Current Outpatient Prescriptions  Medication Sig Dispense Refill  . aspirin 81 MG tablet Take 81 mg by mouth daily.      Marland Kitchen atenolol-chlorthalidone (TENORETIC) 50-25 MG tablet Take 1 tablet by mouth daily. 90 tablet 3  . atorvastatin (LIPITOR) 40 MG tablet Take 1 tablet (40 mg total) by mouth daily. 90 tablet 3  . losartan (COZAAR) 100 MG tablet     . potassium chloride (K-DUR) 10 MEQ tablet Take 1 tablet by mouth  every day 90 tablet 3  . tadalafil (CIALIS) 20 MG tablet Take 1 tablet (20 mg total) by mouth daily as needed. 30 tablet 3   No current facility-administered medications for this encounter.     REVIEW OF SYSTEMS:  On review of systems, the patient reports that he is doing well overall. He denies any chest pain, shortness of breath, cough, fevers, chills,  night sweats, fatigue or unintended weight changes.  He has a h/o CAD and is s/p CABG (4 vessels) in 2006.  He is currently followed with his PCP, Dr. Sharlene Motts but was previously managed with Dr. Jenkins Rouge.He denies any bowel disturbances, and denies abdominal pain, nausea or vomiting. He denies any new musculoskeletal or joint aches or pains. He denies any lower extremity swelling issues. IPSS today in the office is 9 indicating moderate urinary symptoms including, urinary frequency, nocturia x 2 and increased urgency over the last year. He denies dysuria, hematuria, or leakage. He is not able to complete sexual activity. A complete review of systems is obtained and is otherwise negative.     PHYSICAL EXAM:  Wt Readings from Last 3 Encounters:  07/07/16 221 lb 12.8 oz (100.6 kg)  02/22/16 215 lb (97.5 kg)  01/06/15 214 lb (97.1 kg)   Temp Readings from Last 3 Encounters:  02/22/16 98 F (36.7 C)  01/06/15 98.4 F (36.9 C)  12/09/13 98.5 F (36.9 C)   BP Readings from Last 3 Encounters:  07/07/16 136/64  02/22/16 (!) 162/86  01/06/15 (!) 169/81   Pulse Readings from Last 3 Encounters:  07/07/16 63  02/22/16 (!) 56  01/06/15 (!) 55    In general this is a well appearing Caucasian male in no acute distress. He is alert and oriented x4 and appropriate throughout the examination. HEENT reveals that the patient is normocephalic, atraumatic. EOMs are intact. PERRLA. Skin is intact without any evidence of gross lesions. Cardiovascular exam reveals a regular rate and rhythm, no clicks rubs or murmurs are auscultated. Chest is clear to auscultation bilaterally. Lymphatic assessment is performed and does not reveal any adenopathy in the cervical, supraclavicular, axillary, or inguinal chains. Abdomen has active bowel sounds in all quadrants and is intact. The abdomen is soft, non tender, non distended. Lower extremities are negative for pretibial pitting edema, deep calf tenderness, cyanosis or  clubbing.   KPS = 100  100 - Normal; no complaints; no evidence of disease. 90   - Able to carry on normal activity; minor signs or symptoms of disease. 80   - Normal activity with effort; some signs or symptoms of disease. 61   - Cares for self; unable to carry on normal activity or to do active work. 60   - Requires occasional assistance, but is able to care for most of his personal needs. 50   - Requires considerable assistance and frequent medical care. 15   - Disabled; requires special care and assistance. 30   - Severely  disabled; hospital admission is indicated although death not imminent. 33   - Very sick; hospital admission necessary; active supportive treatment necessary. 10   - Moribund; fatal processes progressing rapidly. 0     - Dead  Karnofsky DA, Abelmann Dryden, Craver LS and Burchenal [facility] [phone]) The use of the nitrogen mustards in the palliative treatment of carcinoma: with particular reference to bronchogenic carcinoma Cancer 1 634-56  LABORATORY DATA:  Lab Results  Component Value Date   WBC 7.6 02/22/2016   HGB 16.3 02/22/2016   HCT 48.7 02/22/2016   MCV 95.1 02/22/2016   PLT 222.0 02/22/2016   Lab Results  Component Value Date   NA 140 02/22/2016   K 3.9 02/22/2016   CL 101 02/22/2016   CO2 33 (H) 02/22/2016   Lab Results  Component Value Date   ALT 35 02/22/2016   AST 25 02/22/2016   ALKPHOS 65 02/22/2016   BILITOT 0.9 02/22/2016     RADIOGRAPHY: No results found.    IMPRESSION/PLAN: 1. 76 y.o. gentleman with low risk adenocarcinoma of the prostate, clinical Stage T2a, Gleason's score 3+3, PSA 4.64.  Today we reviewed the findings and workup thus far.  We discussed the natural history of prostate cancer.  We reviewed the the implications of T-stage, Gleason's Score, volume of disease on TRUSPBx and PSA on decision-making and outcomes in prostate cancer.  We discussed radiation treatment in the management of prostate cancer with regard to the logistics  and delivery of external beam radiation treatment as well as the logistics and delivery of prostate brachytherapy.  Risks, benefits and side effects, both short term and long term, of each option was discussed in detail.  The patient has spoken with his urologist about prostatectomy and he is not an ideal candidate for this procedure given his history of CAD and CABG in 2006. The patient expressed interest in prostate brachytherapy.  The patient is undecided on how he would like to proceed with treatment at this time. He will call our clinic with his decision in the next week. We will seek cardiac/medical clearance from his PCP to confirm that he is eligible for prostate brachytherapy.  We will share our findings with Dr. Jeffie Pollock and move forward with treatment planning based on the patient's ultimate decision and eligibility.      We enjoyed meeting with him today, and will look forward to participating in the care of this very nice gentleman.  Freeman Caldron, PA-C  And   Tyler Pita, MD [place] Director and Director of Stereotactic Radiosurgery Direct Dial: [phone]  Fax: [phone] [email or web address]  Skype  LinkedIn  This document serves as a record of services personally performed by Tyler Pita, MD and Freeman Caldron, PAC. It was created on their behalf by Arlyce Harman, a trained medical scribe. The creation of this record is based on the scribe's personal observations and the provider's statements to them. This document has been checked and approved by the attending provider.

## 2016-07-07 NOTE — Progress Notes (Signed)
GU Location of Tumor / Histology: prostatic adenocarcinoma  If Prostate Cancer, Gleason Score is (3 + 3) and PSA is (4.64)  Robert [name] was referred by Dr. [name] to Dr. Jeffie Pollock for evaluation of an elevated PSA November 2017. Prostate volume 23.4 cc.  Biopsies of prostate (if applicable) revealed:    Past/Anticipated interventions by urology, if any: biopsy and referral to Dr. Tammi Klippel to discuss brachytherapy  Past/Anticipated interventions by medical oncology, if any: no  Weight changes, if any: no  Bowel/Bladder complaints, if any: IPSS 9. Denies dysuria, hematuria, or leakage.   Nausea/Vomiting, if any: no  Pain issues, if any:  no  SAFETY ISSUES:  Prior radiation? no  Pacemaker/ICD? no  Possible current pregnancy? no  Is the patient on methotrexate? no  Current Complaints / other details:  76 year old male. Married.

## 2016-07-07 NOTE — Progress Notes (Signed)
See progress note under physician encounter. 

## 2016-07-12 ENCOUNTER — Telehealth: Payer: Self-pay | Admitting: Family Medicine

## 2016-07-12 ENCOUNTER — Telehealth: Payer: Self-pay | Admitting: Urology

## 2016-07-12 NOTE — Telephone Encounter (Signed)
-----   Message from Freeman Caldron, [place] sent at 07/07/2016  1:38 PM EST ----- Regarding: 07/13/16 Follow up with patient regarding decision for brachy vs EBRT.  If decision for brachy, will need to coordinate CT Arch study and OR with Dr. Jeffie Pollock.  Will also need to request medical/cardiac clearance from Dr. Sharlene Motts (PCP).

## 2016-07-12 NOTE — Telephone Encounter (Signed)
° °  Robert [name] with [facility] call to say that pt wants to have a prostate implant and Dr Tyler Pita is requesting a cardiac clearance. Would like a call back [phone]

## 2016-07-12 NOTE — Telephone Encounter (Signed)
Spoke with patient by phone and he has informed me that he would like to move forward with scheduling Brachytherapy for treatment of his prostate cancer.  I informed him that we will contact Dr. Ralene [place] office to coordinate OR time and will be back in contact with him regarding an appointment for CT SIM/pelvic arch study.

## 2016-07-12 NOTE — Telephone Encounter (Signed)
Tell them that I have already cleared him for the surgery. I received a note from [place], and I responded earlier today.

## 2016-07-12 NOTE — Telephone Encounter (Signed)
Yes I can clear him to proceed with brachytherapy. Thanks

## 2016-07-13 NOTE — Telephone Encounter (Signed)
° ° ° °  Spoke with Enid Derry

## 2016-07-14 NOTE — Telephone Encounter (Signed)
Yes this is the official clearance. I saw him recently for a well exam so there is no need for another visit.

## 2016-07-14 NOTE — Telephone Encounter (Signed)
Thank you, Dr. [name]!  We will move forward with scheduling treatment. Warmest regards, -Jewelianna Pancoast

## 2016-07-14 NOTE — Telephone Encounter (Signed)
Dr. [name], Thank you for your prompt response.  Does this serve as permission/clearance to move forward with scheduling his procedure or do you need to see him back in your office for medical/cardiac clearance? -Jerry Clyne

## 2016-07-15 ENCOUNTER — Telehealth: Payer: Self-pay | Admitting: *Deleted

## 2016-07-15 NOTE — Telephone Encounter (Signed)
Called patient to inform of pre-seed appts. For 07-29-16, spoke with patient and he is aware of these appts.

## 2016-07-18 ENCOUNTER — Other Ambulatory Visit: Payer: Self-pay | Admitting: Radiation Oncology

## 2016-07-19 ENCOUNTER — Other Ambulatory Visit: Payer: Self-pay | Admitting: Urology

## 2016-07-19 ENCOUNTER — Telehealth: Payer: Self-pay | Admitting: *Deleted

## 2016-07-19 NOTE — Telephone Encounter (Signed)
Called patient to inform of preseed planning Ct on 07-29-16 and his implant on 09-29-16 @ 7:30 am @ [facility], spoke with patient and he is aware of these appts.

## 2016-07-28 ENCOUNTER — Telehealth: Payer: Self-pay | Admitting: *Deleted

## 2016-07-28 NOTE — Telephone Encounter (Signed)
Called  Patient to remind of pre-seed appts. For 07-29-16, lvm for a return call

## 2016-07-29 ENCOUNTER — Ambulatory Visit
Admission: RE | Admit: 2016-07-29 | Discharge: 2016-07-29 | Disposition: A | Discharge status: Home / Self Care | Payer: PPO | Source: Ambulatory Visit | Attending: Radiation Oncology | Admitting: Radiation Oncology

## 2016-07-29 ENCOUNTER — Encounter: Payer: Self-pay | Admitting: Medical Oncology

## 2016-07-29 ENCOUNTER — Ambulatory Visit (HOSPITAL_COMMUNITY)
Admission: RE | Admit: 2016-07-29 | Discharge: 2016-07-29 | Disposition: A | Discharge status: Home / Self Care | Payer: PPO | Source: Ambulatory Visit | Attending: Urology | Admitting: Urology

## 2016-07-29 ENCOUNTER — Ambulatory Visit: Payer: PPO | Admitting: Radiation Oncology

## 2016-07-29 DIAGNOSIS — C61 Malignant neoplasm of prostate: Secondary | ICD-10-CM | POA: Diagnosis not present

## 2016-07-29 DIAGNOSIS — Z01810 Encounter for preprocedural cardiovascular examination: Secondary | ICD-10-CM | POA: Insufficient documentation

## 2016-07-29 DIAGNOSIS — J9811 Atelectasis: Secondary | ICD-10-CM | POA: Diagnosis not present

## 2016-07-29 DIAGNOSIS — Z510 Encounter for antineoplastic radiation therapy: Secondary | ICD-10-CM | POA: Diagnosis not present

## 2016-07-29 DIAGNOSIS — Z01818 Encounter for other preprocedural examination: Secondary | ICD-10-CM | POA: Diagnosis not present

## 2016-07-29 NOTE — Progress Notes (Signed)
  Radiation Oncology         [phone] ________________________________  Name: Robert [name] MRN: [id number]  Date: 07/29/2016  DOB: 1941/04/30  SIMULATION AND TREATMENT PLANNING NOTE PUBIC ARCH STUDY  [id number] [name], MD  Irine Seal, MD  DIAGNOSIS: 76 y.o. gentleman with clinical Stage T2a adenocarcinoma of the prostate, Gleason's Score 3+3, PSA 4.64    ICD-9-CM ICD-10-CM   1. Malignant neoplasm of prostate ([place]) [place]:  The patient presented today for evaluation for possible prostate seed implant. He was brought to the radiation planning suite and placed supine on the CT couch. A 3-dimensional image study set was obtained in upload to the planning computer. There, on each axial slice, I contoured the prostate gland. Then, using three-dimensional radiation planning tools I reconstructed the prostate in view of the structures from the transperineal needle pathway to assess for possible pubic arch interference. In doing so, I did not appreciate any pubic arch interference. Also, the patient's prostate volume was estimated based on the drawn structure. The volume was 27 cc.  Given the pubic arch appearance and prostate volume, patient remains a good candidate to proceed with prostate seed implant. Today, he freely provided informed written consent to proceed.    PLAN: The patient will undergo prostate seed implant.   ________________________________  Sheral Apley. Tammi Klippel, M.D.  This document serves as a record of services personally performed by Tyler Pita, MD. It was created on his behalf by Darcus Austin, a trained medical scribe. The creation of this record is based on the scribe's personal observations and the provider's statements to them. This document has been checked and approved by the attending provider.

## 2016-08-26 NOTE — Telephone Encounter (Signed)
Duplicate note

## 2016-09-27 ENCOUNTER — Other Ambulatory Visit: Payer: Self-pay | Admitting: Family Medicine

## 2016-09-27 NOTE — Telephone Encounter (Signed)
Can we refill this? 

## 2016-10-06 ENCOUNTER — Telehealth: Payer: Self-pay | Admitting: *Deleted

## 2016-10-06 DIAGNOSIS — C61 Malignant neoplasm of prostate: Secondary | ICD-10-CM | POA: Diagnosis not present

## 2016-10-06 NOTE — Telephone Encounter (Signed)
Returned patients phone call   

## 2016-10-11 ENCOUNTER — Encounter (HOSPITAL_BASED_OUTPATIENT_CLINIC_OR_DEPARTMENT_OTHER): Payer: Self-pay | Admitting: *Deleted

## 2016-10-12 ENCOUNTER — Telehealth: Payer: Self-pay | Admitting: *Deleted

## 2016-10-12 ENCOUNTER — Encounter (HOSPITAL_BASED_OUTPATIENT_CLINIC_OR_DEPARTMENT_OTHER): Payer: Self-pay | Admitting: *Deleted

## 2016-10-12 NOTE — Progress Notes (Signed)
NPO AFTER MN.  ARRIVE AT 0800.  GETTING LAB WORK DONE Thursday, 10-13-2016 (CBC, CMET, PT/INR, PTT).  CURRENT EKG AND CXR IN CHART AND EPIC.  WILL TAKE LIPITOR AND COZAAR AM DOS W/ SIPS OF WATER AND DO FLEET ENEMA.

## 2016-10-12 NOTE — Telephone Encounter (Signed)
CALLED PATIENT TO REMIND OF LABS FOR IMPLANT, LVM FOR A RETURN CALL

## 2016-10-13 DIAGNOSIS — I10 Essential (primary) hypertension: Secondary | ICD-10-CM | POA: Diagnosis not present

## 2016-10-13 DIAGNOSIS — Z7982 Long term (current) use of aspirin: Secondary | ICD-10-CM | POA: Diagnosis not present

## 2016-10-13 DIAGNOSIS — Z951 Presence of aortocoronary bypass graft: Secondary | ICD-10-CM | POA: Diagnosis not present

## 2016-10-13 DIAGNOSIS — E7800 Pure hypercholesterolemia, unspecified: Secondary | ICD-10-CM | POA: Diagnosis not present

## 2016-10-13 DIAGNOSIS — Z79899 Other long term (current) drug therapy: Secondary | ICD-10-CM | POA: Diagnosis not present

## 2016-10-13 DIAGNOSIS — C61 Malignant neoplasm of prostate: Secondary | ICD-10-CM | POA: Diagnosis not present

## 2016-10-13 DIAGNOSIS — I2510 Atherosclerotic heart disease of native coronary artery without angina pectoris: Secondary | ICD-10-CM | POA: Diagnosis not present

## 2016-10-13 LAB — COMPREHENSIVE METABOLIC PANEL
ALBUMIN: 4.1 g/dL (ref 3.5–5.0)
ALK PHOS: 62 U/L (ref 38–126)
ALT: 40 U/L (ref 17–63)
ANION GAP: 8 (ref 5–15)
AST: 29 U/L (ref 15–41)
BILIRUBIN TOTAL: 0.8 mg/dL (ref 0.3–1.2)
BUN: 18 mg/dL (ref 6–20)
CALCIUM: 9.3 mg/dL (ref 8.9–10.3)
CO2: 30 mmol/L (ref 22–32)
CREATININE: 0.87 mg/dL (ref 0.61–1.24)
Chloride: 99 mmol/L — ABNORMAL LOW (ref 101–111)
GFR calc Af Amer: >60 mL/min (ref >60)
GFR calc non Af Amer: >60 mL/min (ref >60)
GLUCOSE: 124 mg/dL — AB (ref 65–99)
Potassium: 3.5 mmol/L (ref 3.5–5.1)
Sodium: 137 mmol/L (ref 135–145)
Total Protein: 7 g/dL (ref 6.5–8.1)

## 2016-10-13 LAB — CBC
HEMATOCRIT: 47.1 % (ref 39.0–52.0)
HEMOGLOBIN: 15.5 g/dL (ref 13.0–17.0)
MCH: 31.6 pg (ref 26.0–34.0)
MCHC: 32.9 g/dL (ref 30.0–36.0)
MCV: 95.9 fL (ref 78.0–100.0)
Platelets: 196 10*3/uL (ref 150–400)
RBC: 4.91 MIL/uL (ref 4.22–5.81)
RDW: 13.4 % (ref 11.5–15.5)
WBC: 6.8 10*3/uL (ref 4.0–10.5)

## 2016-10-13 LAB — PROTIME-INR
INR: 0.97
Prothrombin Time: 12.9 seconds (ref 11.4–15.2)

## 2016-10-13 LAB — APTT: APTT: 28 s (ref 24–36)

## 2016-10-18 DIAGNOSIS — C61 Malignant neoplasm of prostate: Secondary | ICD-10-CM | POA: Diagnosis not present

## 2016-10-19 ENCOUNTER — Encounter (HOSPITAL_BASED_OUTPATIENT_CLINIC_OR_DEPARTMENT_OTHER): Payer: Self-pay | Admitting: *Deleted

## 2016-10-19 ENCOUNTER — Telehealth: Payer: Self-pay | Admitting: *Deleted

## 2016-10-19 NOTE — H&P (Signed)
CC/HPI: He has Gleason 6 PCa mostly on the left side. This gentleman will be undergoing brachytherapy later this month. He presents today for preoperative history and physical. No changes in baseline frequency and nocturia. Denies dysuria or hematuria. No flank or back pain. No pelvic pain. No recent fevers or infections.     ALLERGIES: No Allergies    MEDICATIONS: Aspirin 81 mg tablet, chewable  Atenolol-Chlorthalidone 50 mg-25 mg tablet  Atorvastatin Calcium 40 mg tablet  Losartan Potassium 100 mg tablet  Potassium Chloride     GU PSH: Prostate Needle Biopsy - 06/15/2016    NON-GU PSH: CABG (coronary artery bypass grafting) Surgical Pathology, Gross And Microscopic Examination For Prostate Needle - 06/15/2016    GU PMH: ED due to arterial insufficiency, HE has severe ED. - 06/23/2016 Prostate Cancer, T2a Nx Mx bulky Gleason 6 disease on the left. - 06/23/2016 Elevated PSA, 4.64 which has been rising for the last 4 years. - 05/04/2016 Nocturia, Mild LUTS - 05/04/2016 Prostate nodule w/ LUTS, [place] nodule at right base. - 05/04/2016    NON-GU PMH: Atrial Fibrillation Coronary Artery Disease Hypercholesterolemia Hypertension    FAMILY HISTORY: Breast Cancer - Sister, Grandmother Heart Disease - Father Prostate Cancer - Runs in Family   SOCIAL HISTORY: Marital Status: Married Current Smoking Status: Patient has never smoked.  Has never drank.  Drinks 4+ caffeinated drinks per day. Patient's occupation is/was retired.     Notes: 2 daughters   REVIEW OF SYSTEMS:    GU Review Male:   Patient reports frequent urination, get up at night to urinate, and erection problems. Patient denies hard to postpone urination, burning/ pain with urination, leakage of urine, stream starts and stops, trouble starting your stream, have to strain to urinate , and penile pain.  Gastrointestinal (Upper):   Patient denies nausea, vomiting, and indigestion/ heartburn.  Gastrointestinal (Lower):    Patient denies diarrhea and constipation.  Constitutional:   Patient denies fever, night sweats, weight loss, and fatigue.  Skin:   Patient denies skin rash/ lesion and itching.  Eyes:   Patient denies blurred vision and double vision.  Ears/ Nose/ Throat:   Patient denies sore throat and sinus problems.  Hematologic/Lymphatic:   Patient reports easy bruising. Patient denies swollen glands.  Cardiovascular:   Patient denies leg swelling and chest pains.  Respiratory:   Patient denies cough and shortness of breath.  Endocrine:   Patient denies excessive thirst.  Musculoskeletal:   Patient denies back pain and joint pain.  Neurological:   Patient denies headaches and dizziness.  Psychologic:   Patient denies depression and anxiety.   VITAL SIGNS:      10/06/2016 10:14 AM  Weight 216 lb / 97.98 kg  Height 67 in / 170.18 cm  BP 148/70 mmHg  Pulse 56 /min  Temperature 97.5 F / 36 C  BMI 33.8 kg/m   MULTI-SYSTEM PHYSICAL EXAMINATION:    Constitutional: Well-nourished. No physical deformities. Normally developed. Good grooming.  Neck: Neck symmetrical, not swollen. Normal tracheal position.  Respiratory: No labored breathing, no use of accessory muscles. CTA.  Cardiovascular: Normal temperature, normal extremity pulses, no swelling, no varicosities. RRR w/o murmur.  Lymphatic: No enlargement of neck, axillae, groin.  Skin: No paleness, no jaundice, no cyanosis. No lesion, no ulcer, no rash.  Neurologic / Psychiatric: Oriented to time, oriented to place, oriented to person. No depression, no anxiety, no agitation.  Gastrointestinal: Obese abdomen. No mass, no tenderness, no rigidity. No flank or suprapubic tenderness.  Musculoskeletal: Spine, ribs, pelvis no bilateral tenderness. Normal gait and station of head and neck.     PAST DATA REVIEWED:  Source Of History:  Patient, Family/Caregiver  Lab Test Review:   PSA  Records Review:   Pathology Reports, Previous Patient Records  Urine  Test Review:   Urinalysis   02/22/16 01/06/15 12/09/13 11/20/12 10/17/11  PSA  Total PSA 4.64 ng/dl 3.59 ng/dl 2.67 ng/dl 3.02 ng/dl 1.57 ng/dl    10/06/16  Urinalysis  Urine Appearance Clear   Urine Color Yellow   Urine Glucose Neg   Urine Bilirubin Neg   Urine Ketones Neg   Urine Specific Gravity 1.015   Urine Blood Neg   Urine pH 6.5   Urine Protein Neg   Urine Urobilinogen 0.2   Urine Nitrites Neg   Urine Leukocyte Esterase Neg    PROCEDURES:          Urinalysis Dipstick Dipstick Cont'd  Color: Yellow Bilirubin: Neg  Appearance: Clear Ketones: Neg  Specific Gravity: 1.015 Blood: Neg  pH: 6.5 Protein: Neg  Glucose: Neg Urobilinogen: 0.2    Nitrites: Neg    Leukocyte Esterase: Neg    ASSESSMENT:      ICD-10 Details  1 GU:   Prostate Cancer - C61    PLAN:           Orders Labs Urine Culture          Schedule Return Visit/Planned Activity: Keep Scheduled Appointment - Office Visit          Document Letter(s):  Created for Patient: Clinical Summary         Notes:   PE WNL. UA sent for c/s to serve as baseline. Constitutionally he feels well and is ready for his upcoming procedure. Answered all questions to the best of my ability. I will see him at his first postoperative visit.

## 2016-10-19 NOTE — Telephone Encounter (Signed)
CALLED PATIENT TO REMIND OF PROCEDURE FOR 10-20-16, SPOKE WITH PATIENT AND HE IS AWARE OF THIS PROCEDURE

## 2016-10-20 ENCOUNTER — Ambulatory Visit (HOSPITAL_COMMUNITY): Payer: PPO

## 2016-10-20 ENCOUNTER — Encounter (HOSPITAL_BASED_OUTPATIENT_CLINIC_OR_DEPARTMENT_OTHER)
Admission: RE | Disposition: A | Discharge status: Home / Self Care | Payer: Self-pay | Source: Ambulatory Visit | Attending: Urology

## 2016-10-20 ENCOUNTER — Ambulatory Visit (HOSPITAL_BASED_OUTPATIENT_CLINIC_OR_DEPARTMENT_OTHER): Payer: PPO | Admitting: Anesthesiology

## 2016-10-20 ENCOUNTER — Encounter (HOSPITAL_BASED_OUTPATIENT_CLINIC_OR_DEPARTMENT_OTHER): Payer: Self-pay | Admitting: *Deleted

## 2016-10-20 ENCOUNTER — Ambulatory Visit (HOSPITAL_BASED_OUTPATIENT_CLINIC_OR_DEPARTMENT_OTHER)
Admission: RE | Admit: 2016-10-20 | Discharge: 2016-10-20 | Disposition: A | Discharge status: Home / Self Care | Payer: PPO | Source: Ambulatory Visit | Attending: Urology | Admitting: Urology

## 2016-10-20 DIAGNOSIS — Z951 Presence of aortocoronary bypass graft: Secondary | ICD-10-CM | POA: Diagnosis not present

## 2016-10-20 DIAGNOSIS — Z01818 Encounter for other preprocedural examination: Secondary | ICD-10-CM

## 2016-10-20 DIAGNOSIS — E7800 Pure hypercholesterolemia, unspecified: Secondary | ICD-10-CM | POA: Insufficient documentation

## 2016-10-20 DIAGNOSIS — C61 Malignant neoplasm of prostate: Secondary | ICD-10-CM | POA: Diagnosis not present

## 2016-10-20 DIAGNOSIS — I2510 Atherosclerotic heart disease of native coronary artery without angina pectoris: Secondary | ICD-10-CM | POA: Insufficient documentation

## 2016-10-20 DIAGNOSIS — Z7982 Long term (current) use of aspirin: Secondary | ICD-10-CM | POA: Diagnosis not present

## 2016-10-20 DIAGNOSIS — Z79899 Other long term (current) drug therapy: Secondary | ICD-10-CM | POA: Insufficient documentation

## 2016-10-20 DIAGNOSIS — N529 Male erectile dysfunction, unspecified: Secondary | ICD-10-CM | POA: Diagnosis not present

## 2016-10-20 DIAGNOSIS — I10 Essential (primary) hypertension: Secondary | ICD-10-CM | POA: Diagnosis not present

## 2016-10-20 DIAGNOSIS — N401 Enlarged prostate with lower urinary tract symptoms: Secondary | ICD-10-CM | POA: Diagnosis not present

## 2016-10-20 DIAGNOSIS — N138 Other obstructive and reflux uropathy: Secondary | ICD-10-CM | POA: Diagnosis not present

## 2016-10-20 HISTORY — DX: Personal history of colonic polyps: Z86.010

## 2016-10-20 HISTORY — DX: Hyperlipidemia, unspecified: E78.5

## 2016-10-20 HISTORY — DX: Presence of aortocoronary bypass graft: Z95.1

## 2016-10-20 HISTORY — DX: Nodular prostate with lower urinary tract symptoms: N40.3

## 2016-10-20 HISTORY — DX: Diverticulosis of large intestine without perforation or abscess without bleeding: K57.30

## 2016-10-20 HISTORY — PX: RADIOACTIVE SEED IMPLANT: SHX5150

## 2016-10-20 HISTORY — DX: Personal history of colon polyps, unspecified: Z86.0100

## 2016-10-20 HISTORY — DX: Presence of spectacles and contact lenses: Z97.3

## 2016-10-20 SURGERY — INSERTION, RADIATION SOURCE, PROSTATE
Anesthesia: General | Site: Perineum

## 2016-10-20 MED ORDER — MIDAZOLAM HCL 2 MG/2ML IJ SOLN
INTRAMUSCULAR | Status: AC
  Filled 2016-10-20: qty 2

## 2016-10-20 MED ORDER — LIDOCAINE 2% (20 MG/ML) 5 ML SYRINGE
INTRAMUSCULAR | Status: AC
  Filled 2016-10-20: qty 5

## 2016-10-20 MED ORDER — FENTANYL CITRATE (PF) 100 MCG/2ML IJ SOLN
INTRAMUSCULAR | Status: DC | PRN
  Administered 2016-10-20 (×2): 25 ug via INTRAVENOUS
  Administered 2016-10-20: 50 ug via INTRAVENOUS

## 2016-10-20 MED ORDER — ACETAMINOPHEN 325 MG PO TABS
650.0000 mg | ORAL_TABLET | ORAL | Status: DC | PRN
  Filled 2016-10-20: qty 2

## 2016-10-20 MED ORDER — FENTANYL CITRATE (PF) 100 MCG/2ML IJ SOLN
25.0000 ug | INTRAMUSCULAR | Status: DC | PRN
  Filled 2016-10-20: qty 1

## 2016-10-20 MED ORDER — SODIUM CHLORIDE 0.9% FLUSH
3.0000 mL | Freq: Two times a day (BID) | INTRAVENOUS | Status: DC
  Filled 2016-10-20: qty 3

## 2016-10-20 MED ORDER — LIDOCAINE 2% (20 MG/ML) 5 ML SYRINGE
INTRAMUSCULAR | Status: DC | PRN
  Administered 2016-10-20: 80 mg via INTRAVENOUS

## 2016-10-20 MED ORDER — ONDANSETRON HCL 4 MG/2ML IJ SOLN
INTRAMUSCULAR | Status: AC
  Filled 2016-10-20: qty 2

## 2016-10-20 MED ORDER — OXYCODONE HCL 5 MG PO TABS
5.0000 mg | ORAL_TABLET | ORAL | Status: DC | PRN
  Filled 2016-10-20: qty 2

## 2016-10-20 MED ORDER — TRAMADOL HCL 50 MG PO TABS: 50.0000 mg | ORAL_TABLET | Freq: Four times a day (QID) | ORAL | 0 refills | Status: DC | PRN

## 2016-10-20 MED ORDER — PROPOFOL 10 MG/ML IV BOLUS
INTRAVENOUS | Status: DC | PRN
  Administered 2016-10-20: 200 mg via INTRAVENOUS

## 2016-10-20 MED ORDER — CIPROFLOXACIN IN D5W 400 MG/200ML IV SOLN
400.0000 mg | INTRAVENOUS | Status: AC
  Administered 2016-10-20: 400 mg via INTRAVENOUS
  Filled 2016-10-20: qty 200

## 2016-10-20 MED ORDER — MIDAZOLAM HCL 5 MG/5ML IJ SOLN
INTRAMUSCULAR | Status: DC | PRN
  Administered 2016-10-20: 1 mg via INTRAVENOUS

## 2016-10-20 MED ORDER — SODIUM CHLORIDE 0.9 % IV SOLN
250.0000 mL | INTRAVENOUS | Status: DC | PRN
  Filled 2016-10-20: qty 250

## 2016-10-20 MED ORDER — CIPROFLOXACIN IN D5W 400 MG/200ML IV SOLN
INTRAVENOUS | Status: AC
  Filled 2016-10-20: qty 200

## 2016-10-20 MED ORDER — ACETAMINOPHEN 650 MG RE SUPP
650.0000 mg | RECTAL | Status: DC | PRN
  Filled 2016-10-20: qty 1

## 2016-10-20 MED ORDER — LACTATED RINGERS IV SOLN
INTRAVENOUS | Status: DC
  Administered 2016-10-20: 08:00:00 via INTRAVENOUS
  Filled 2016-10-20: qty 1000

## 2016-10-20 MED ORDER — FLEET ENEMA 7-19 GM/118ML RE ENEM
1.0000 | ENEMA | Freq: Once | RECTAL | Status: AC
  Administered 2016-10-20: 1 via RECTAL
  Filled 2016-10-20: qty 1

## 2016-10-20 MED ORDER — SODIUM CHLORIDE 0.9% FLUSH
3.0000 mL | INTRAVENOUS | Status: DC | PRN
  Filled 2016-10-20: qty 3

## 2016-10-20 MED ORDER — DEXAMETHASONE SODIUM PHOSPHATE 10 MG/ML IJ SOLN
INTRAMUSCULAR | Status: AC
  Filled 2016-10-20: qty 1

## 2016-10-20 MED ORDER — MORPHINE SULFATE (PF) 2 MG/ML IV SOLN
2.0000 mg | INTRAVENOUS | Status: DC | PRN
  Filled 2016-10-20: qty 1

## 2016-10-20 MED ORDER — DOCUSATE SODIUM 100 MG PO CAPS: 100.0000 mg | ORAL_CAPSULE | Freq: Two times a day (BID) | ORAL | 2 refills | Status: DC

## 2016-10-20 MED ORDER — FENTANYL CITRATE (PF) 100 MCG/2ML IJ SOLN
INTRAMUSCULAR | Status: AC
  Filled 2016-10-20: qty 2

## 2016-10-20 MED ORDER — ONDANSETRON HCL 4 MG/2ML IJ SOLN
INTRAMUSCULAR | Status: DC | PRN
  Administered 2016-10-20: 4 mg via INTRAVENOUS

## 2016-10-20 MED ORDER — DEXAMETHASONE SODIUM PHOSPHATE 4 MG/ML IJ SOLN
INTRAMUSCULAR | Status: DC | PRN
  Administered 2016-10-20: 10 mg via INTRAVENOUS

## 2016-10-20 MED ORDER — CIPROFLOXACIN HCL 500 MG PO TABS
500.0000 mg | ORAL_TABLET | Freq: Two times a day (BID) | ORAL | 0 refills | Status: DC
Start: 2016-10-20 — End: 2017-03-10

## 2016-10-20 MED FILL — traMADol HCL 50 MG TABS: 50 | 2 days supply | Qty: 8 | Fill #0

## 2016-10-20 MED FILL — CIPROFLOXACIN HCL 500 MG TA: 500 | 3 days supply | Qty: 6 | Fill #0

## 2016-10-20 SURGICAL SUPPLY — 31 items
BAG URINE DRAINAGE (UROLOGICAL SUPPLIES) ×3 IMPLANT
BLADE CLIPPER SURG (BLADE) ×3 IMPLANT
CATH FOLEY 2WAY SLVR  5CC 16FR (CATHETERS) ×2
CATH FOLEY 2WAY SLVR 5CC 16FR (CATHETERS) ×1 IMPLANT
CATH ROBINSON RED A/P 20FR (CATHETERS) ×3 IMPLANT
CLOTH BEACON ORANGE TIMEOUT ST (SAFETY) ×3 IMPLANT
COVER BACK TABLE 60X90IN (DRAPES) ×3 IMPLANT
COVER MAYO STAND STRL (DRAPES) ×3 IMPLANT
DRSG TEGADERM 4X4.75 (GAUZE/BANDAGES/DRESSINGS) ×3 IMPLANT
DRSG TEGADERM 8X12 (GAUZE/BANDAGES/DRESSINGS) ×6 IMPLANT
GAUZE SPONGE 4X4 12PLY STRL LF (GAUZE/BANDAGES/DRESSINGS) ×3 IMPLANT
GLOVE BIO SURGEON STRL SZ7.5 (GLOVE) ×12 IMPLANT
GLOVE ECLIPSE 8.0 STRL XLNG CF (GLOVE) IMPLANT
GLOVE SURG SS PI 8.0 STRL IVOR (GLOVE) ×6 IMPLANT
GOWN STRL REUS W/ TWL LRG LVL3 (GOWN DISPOSABLE) ×1 IMPLANT
GOWN STRL REUS W/ TWL XL LVL3 (GOWN DISPOSABLE) ×1 IMPLANT
GOWN STRL REUS W/TWL LRG LVL3 (GOWN DISPOSABLE) ×2
GOWN STRL REUS W/TWL XL LVL3 (GOWN DISPOSABLE) ×2
HOLDER FOLEY CATH W/STRAP (MISCELLANEOUS) ×3 IMPLANT
IV NS 1000ML (IV SOLUTION)
IV NS 1000ML BAXH (IV SOLUTION) IMPLANT
KIT RM TURNOVER CYSTO AR (KITS) ×3 IMPLANT
MANIFOLD NEPTUNE II (INSTRUMENTS) IMPLANT
PACK CYSTO (CUSTOM PROCEDURE TRAY) ×3 IMPLANT
SYRINGE 10CC LL (SYRINGE) ×3 IMPLANT
TUBE CONNECTING 12'X1/4 (SUCTIONS)
TUBE CONNECTING 12X1/4 (SUCTIONS) IMPLANT
UNDERPAD 30X30 INCONTINENT (UNDERPADS AND DIAPERS) ×6 IMPLANT
WATER STERILE IRR 3000ML UROMA (IV SOLUTION) ×3 IMPLANT
WATER STERILE IRR 500ML POUR (IV SOLUTION) ×3 IMPLANT
select seed 1-125 ×3 IMPLANT

## 2016-10-20 NOTE — Anesthesia Preprocedure Evaluation (Signed)
Anesthesia Evaluation  Patient identified by MRN, date of birth, ID band Patient awake    Reviewed: Allergy & Precautions, H&P , Patient's Chart, lab work & pertinent test results, reviewed documented beta blocker date and time   Airway Mallampati: II  TM Distance: >3 FB Neck ROM: full    Dental no notable dental hx.    Pulmonary    Pulmonary exam normal breath sounds clear to auscultation       Cardiovascular hypertension,  Rhythm:regular Rate:Normal     Neuro/Psych    GI/Hepatic   Endo/Other    Renal/GU      Musculoskeletal   Abdominal   Peds  Hematology   Anesthesia Other Findings Hypertension    S/P CABG x 4 For CAD ; EKG sinus brady      Reproductive/Obstetrics                             Anesthesia Physical Anesthesia Plan  ASA: III  Anesthesia Plan: General   Post-op Pain Management:    Induction: Intravenous  Airway Management Planned: LMA  Additional Equipment:   Intra-op Plan:   Post-operative Plan:   Informed Consent: I have reviewed the patients History and Physical, chart, labs and discussed the procedure including the risks, benefits and alternatives for the proposed anesthesia with the patient or authorized representative who has indicated his/her understanding and acceptance.   Dental Advisory Given  Plan Discussed with: CRNA and Surgeon  Anesthesia Plan Comments: ( )        Anesthesia Quick Evaluation

## 2016-10-20 NOTE — Progress Notes (Signed)
  Radiation Oncology         [phone] ________________________________  Name: Robert [name] MRN: [id number]  Date: 10/20/2016  DOB: Jul 13, 1940       Prostate Seed Implant  CC:Fry, Ishmael Holter, MD  No ref. provider found  DIAGNOSIS: 76 year-old gentleman with clinical Stage T2a adenocarcinoma of the prostate, Gleason's Score 3+3, PSA 4.64    ICD-9-CM ICD-10-CM   1. Preop examination V72.84 Z01.818 DG Chest 2 View     DG Chest 2 View  2. Prostate cancer ([place]) 185 C61 CANCELED: DG Chest 2 View     CANCELED: DG Chest 2 View    PROCEDURE: Insertion of radioactive I-125 seeds into the prostate gland.  RADIATION DOSE: 145 Gy, definitive therapy.  TECHNIQUE: Robert [name] was brought to the operating room with the urologist. He was placed in the dorsolithotomy position. He was catheterized and a rectal tube was inserted. The perineum was shaved, prepped and draped. The ultrasound probe was then introduced into the rectum to see the prostate gland.  TREATMENT DEVICE: A needle grid was attached to the ultrasound probe stand and anchor needles were placed.  3D PLANNING: The prostate was imaged in 3D using a sagittal sweep of the prostate probe. These images were transferred to the planning computer. There, the prostate, urethra and rectum were defined on each axial reconstructed image. Then, the software created an optimized 3D plan and a few seed positions were adjusted. The quality of the plan was reviewed using [facility] information for the target and the following two organs at risk:  Urethra and Rectum.  Then the accepted plan was uploaded to the seed [employer] afterloading unit.  PROSTATE VOLUME STUDY:  Using transrectal ultrasound the volume of the prostate was verified to be 31 cc.  SPECIAL TREATMENT PROCEDURE/SUPERVISION AND HANDLING: The Nucletron FIRST system was used to place the needles under sagittal guidance. A total of 21 needles were used to deposit 71 seeds in the prostate  gland. The individual seed activity was 0.363 mCi.  COMPLEX SIMULATION: At the end of the procedure, an anterior radiograph of the pelvis was obtained to document seed positioning and count. Cystoscopy was performed to check the urethra and bladder.  MICRODOSIMETRY: At the end of the procedure, the patient was emitting 0.09 mR/hr at 1 meter. Accordingly, he was considered safe for hospital discharge.  PLAN: The patient will return to the radiation oncology clinic for post implant CT dosimetry in three weeks.   ________________________________  Sheral Apley Tammi Klippel, M.D.

## 2016-10-20 NOTE — Progress Notes (Signed)
Assisted pt oob to br.  Pt voided qs.  Upon arrival to phase 2, pt's speech slightly slurred,. Pt unable to express himself verbally.  When asked what he wanted to drink, pt unable to say word "water".  When asked who came with him, pt unable to say "wife".  Pt alert and fully oriented.  Dr. Glennon Mac informed and at bedside.  Speech better in approximately 5 minutes.  Pt aware of change and communication much improved.  Dr. Glennon Mac aware. No new orders.  Report given to D. Rolena Infante, Therapist, sports .

## 2016-10-20 NOTE — Anesthesia Procedure Notes (Signed)
Procedure Name: LMA Insertion Date/Time: 10/20/2016 9:42 AM Performed by: Denna Haggard D Pre-anesthesia Checklist: Patient identified, Emergency Drugs available, Suction available and Patient being monitored Patient Re-evaluated:Patient Re-evaluated prior to inductionOxygen Delivery Method: Circle system utilized Preoxygenation: Pre-oxygenation with 100% oxygen Intubation Type: IV induction Ventilation: Mask ventilation without difficulty LMA: LMA inserted LMA Size: 4.0 Number of attempts: 1 Airway Equipment and Method: Bite block Placement Confirmation: positive ETCO2 Tube secured with: Tape Dental Injury: Teeth and Oropharynx as per pre-operative assessment

## 2016-10-20 NOTE — Interval H&P Note (Signed)
History and Physical Interval Note:  10/20/2016 8:36 AM  Robert [name]  has presented today for surgery, with the diagnosis of PROSTATE CANCER  The various methods of treatment have been discussed with the patient and family. After consideration of risks, benefits and other options for treatment, the patient has consented to  Procedure(s): RADIOACTIVE SEED IMPLANT/BRACHYTHERAPY IMPLANT ([name]) as a surgical intervention .  The patient's history has been reviewed, patient examined, no change in status, stable for surgery.  I have reviewed the patient's chart and labs.  Questions were answered to the patient's satisfaction.     Rael Yo J

## 2016-10-20 NOTE — Transfer of Care (Signed)
Immediate Anesthesia Transfer of Care Note  Patient: Robert [name]  Procedure(s) Performed: Procedure(s) (LRB): RADIOACTIVE SEED IMPLANT/BRACHYTHERAPY IMPLANT ([name])  Patient Location: PACU  Anesthesia Type: General  Level of Consciousness: awake, oriented, sedated and patient cooperative  Airway & Oxygen Therapy: Patient Spontanous Breathing and Patient connected to face mask oxygen  Post-op Assessment: Report given to PACU RN and Post -op Vital signs reviewed and stable  Post vital signs: Reviewed and stable  Complications: No apparent anesthesia complications  Last Vitals:  Vitals:   10/20/16 1118 10/20/16 1119  BP: (!) 148/66   Pulse: 76   Resp:  12  Temp:  36.5 C

## 2016-10-20 NOTE — Discharge Instructions (Addendum)
Brachytherapy for Prostate Cancer, Care After Refer to this sheet in the next few weeks. These instructions provide you with information on caring for yourself after your procedure. Your health care provider may also give you more specific instructions. Your treatment has been planned according to current medical practices, but problems sometimes occur. Call your health care provider if you have any problems or questions after your procedure. What can I expect after the procedure? The area behind the scrotum will probably be tender and bruised. For a short period of time you may have:  Difficulty passing urine. You may need a catheter for a few days to a month.  Blood in the urine or semen.  A feeling of constipation because of prostate swelling.  Frequent feeling of an urgent need to urinate. For a long period of time you may have:  Inflammation of the rectum. This happens in about 2% of people who have the procedure.  Erection problems. These vary with age and occur in about 15-40% of men.  Difficulty urinating. This is caused by scarring in the urethra.  Diarrhea. Follow these instructions at home:  Take medicines only as directed by your health care provider.  Keep all follow-up visits as directed by your health care provider. If you have a catheter, it will be removed during one of these visits.  Try not to sit directly on the area behind the scrotum. A soft cushion can decrease the discomfort. Ice packs may also be helpful for the discomfort. Do not put ice directly on the skin.  Shower and wash the area behind the scrotum gently. Do not sit in a tub for 3-4 days.  If you have had the brachytherapy that uses the seeds, limit your close contact with children and pregnant women for 2 months because of the radiation still in the prostate. After that period of time, the levels drop off quickly. Get help right away if:  You have a fever.  You have chills.  You have shortness  of breath.  You have chest pain.  You have thick blood, like tomato juice, in the urine bag.  Your catheter is blocked so urine cannot get into the bag. Your bladder area or lower abdomen may be swollen.  There is excessive bleeding from your rectum. It is normal to have a little blood mixed with your stool.  There is severe discomfort in the treated area that does not go away with pain medicine.  You have abdominal discomfort.  You have severe nausea or vomiting.  You develop any new or unusual symptoms. This information is not intended to replace advice given to you by your health care provider. Make sure you discuss any questions you have with your health care provider. Document Released: 06/25/2010 Document Revised: 11/04/2015 Document Reviewed: 11/13/2012 Elsevier Interactive Patient Education  2017 [facility] Instructions  Activity: Get plenty of rest for the remainder of the day. A responsible individual must stay with you for 24 hours following the procedure.  For the next 24 hours, DO NOT: -Drive a car -Paediatric nurse -Drink alcoholic beverages -Take any medication unless instructed by your physician -Make any legal decisions or sign important papers.  Meals: Start with liquid foods such as gelatin or soup. Progress to regular foods as tolerated. Avoid greasy, spicy, heavy foods. If nausea and/or vomiting occur, drink only clear liquids until the nausea and/or vomiting subsides. Call your physician if vomiting continues.  Special Instructions/Symptoms: Your throat may  feel dry or sore from the anesthesia or the breathing tube placed in your throat during surgery. If this causes discomfort, gargle with warm salt water. The discomfort should disappear within 24 hours.  If you had a scopolamine patch placed behind your ear for the management of post- operative nausea and/or vomiting:  1. The medication in the patch is effective for 72  hours, after which it should be removed.  Wrap patch in a tissue and discard in the trash. Wash hands thoroughly with soap and water. 2. You may remove the patch earlier than 72 hours if you experience unpleasant side effects which may include dry mouth, dizziness or visual disturbances. 3. Avoid touching the patch. Wash your hands with soap and water after contact with the patch.

## 2016-10-20 NOTE — Op Note (Signed)
PATIENT:  Robert [name]  PRE-OPERATIVE DIAGNOSIS:  Adenocarcinoma of the prostate  POST-OPERATIVE DIAGNOSIS:  Same  PROCEDURE:  Procedure(s): 1. I-125 radioactive seed implantation 2. Cystoscopy  SURGEON:  Surgeon(s): Irine Seal MD  Radiation oncologist: Dr. Tyler Pita  ANESTHESIA:  General  EBL:  Minimal  DRAINS: 16 French Foley catheter  INDICATION: Robert [name] is a 76 y.o. with Stage T1c, Gleason 6 prostate cancer who has elected brachytherapy for treatment.  Description of procedure: After informed consent the patient was brought to the major OR, placed on the table and administered general anesthesia. He was then moved to the modified lithotomy position with his perineum perpendicular to the floor. His perineum and genitalia were then sterilely prepped. An official timeout was then performed. A 16 French Foley catheter was then placed in the bladder and filled with dilute contrast, a rectal tube was placed in the rectum and the transrectal ultrasound probe was placed in the rectum and affixed to the stand. He was then sterilely draped.  The sterile grid was installed.   Anchor needles were then placed.   Real time ultrasonography was used along with the seed planning software spot-pro version 3.1-00. This was used to develop the seed plan including the number of needles as well as number of seeds required for complete and adequate coverage. Real-time ultrasonography was then used along with the previously developed plan and the Nucletron device to implant a total of 71  seeds using 21 needles for a target dose of 145 Gy. This proceeded without difficulty or complication.  A Foley catheter was then removed as well as the transrectal ultrasound probe and rectal probe. Flexible cystoscopy was then performed using the 17 French flexible scope which revealed a normal urethra throughout its length down to the sphincter which appeared intact. The prostatic urethra was 2cm with  bilobar hyperplasia. The bladder was then entered and fully and systematically inspected.  The ureteral orifices were noted to be of normal configuration and position. The mucosa revealed no evidence of tumors. There were also no stones identified within the bladder.  No seeds or spacers were seen and/or removed from the bladder.  The cystoscope was then removed.  The drapes were removed.  The perineum was cleaned and dressed.  He was taken out of the lithotomy position and was awakened and taken to recovery room in stable and satisfactory condition. He tolerated procedure well and there were no intraoperative complications.

## 2016-10-21 ENCOUNTER — Encounter (HOSPITAL_BASED_OUTPATIENT_CLINIC_OR_DEPARTMENT_OTHER): Payer: Self-pay | Admitting: Urology

## 2016-10-23 NOTE — Anesthesia Postprocedure Evaluation (Signed)
Anesthesia Post Note  Patient: Robert [name]  Procedure(s) Performed: Procedure(s) (LRB): RADIOACTIVE SEED IMPLANT/BRACHYTHERAPY IMPLANT, 71 seeds implanted, no seeds found in bladder ([name])  Patient location during evaluation: PACU Anesthesia Type: General Level of consciousness: awake and alert Pain management: pain level controlled Vital Signs Assessment: post-procedure vital signs reviewed and stable Respiratory status: spontaneous breathing, nonlabored ventilation, respiratory function stable and patient connected to nasal cannula oxygen Cardiovascular status: blood pressure returned to baseline and stable Postop Assessment: no signs of nausea or vomiting Anesthetic complications: no        Last Vitals:  Vitals:   10/20/16 1215 10/20/16 1304  BP: (!) 138/55 (!) 148/65  Pulse: 64 63  Resp: 16 16  Temp:  36.4 C    Last Pain:  Vitals:   10/21/16 0951  TempSrc:   PainSc: 2    Pain Goal: Patients Stated Pain Goal: 6 (10/20/16 0821)               Riccardo Dubin

## 2016-11-08 ENCOUNTER — Telehealth: Payer: Self-pay | Admitting: *Deleted

## 2016-11-08 NOTE — Telephone Encounter (Signed)
XXXX 

## 2016-11-08 NOTE — Telephone Encounter (Signed)
CALLED PATIENT TO INFORM OF POST SEED APPTS. FOR 11-09-16, LVM FOR A RETURN CALL

## 2016-11-09 ENCOUNTER — Ambulatory Visit
Admission: RE | Admit: 2016-11-09 | Discharge: 2016-11-09 | Disposition: A | Discharge status: Home / Self Care | Payer: PPO | Source: Ambulatory Visit | Attending: Radiation Oncology | Admitting: Radiation Oncology

## 2016-11-09 VITALS — BP 149/77 | HR 62 | Resp 16

## 2016-11-09 DIAGNOSIS — Z510 Encounter for antineoplastic radiation therapy: Secondary | ICD-10-CM | POA: Insufficient documentation

## 2016-11-09 DIAGNOSIS — I10 Essential (primary) hypertension: Secondary | ICD-10-CM | POA: Insufficient documentation

## 2016-11-09 DIAGNOSIS — Z7982 Long term (current) use of aspirin: Secondary | ICD-10-CM | POA: Insufficient documentation

## 2016-11-09 DIAGNOSIS — Z951 Presence of aortocoronary bypass graft: Secondary | ICD-10-CM | POA: Insufficient documentation

## 2016-11-09 DIAGNOSIS — N402 Nodular prostate without lower urinary tract symptoms: Secondary | ICD-10-CM | POA: Diagnosis not present

## 2016-11-09 DIAGNOSIS — E785 Hyperlipidemia, unspecified: Secondary | ICD-10-CM | POA: Insufficient documentation

## 2016-11-09 DIAGNOSIS — Z8042 Family history of malignant neoplasm of prostate: Secondary | ICD-10-CM | POA: Insufficient documentation

## 2016-11-09 DIAGNOSIS — N529 Male erectile dysfunction, unspecified: Secondary | ICD-10-CM | POA: Diagnosis not present

## 2016-11-09 DIAGNOSIS — Z803 Family history of malignant neoplasm of breast: Secondary | ICD-10-CM | POA: Insufficient documentation

## 2016-11-09 DIAGNOSIS — C61 Malignant neoplasm of prostate: Secondary | ICD-10-CM | POA: Insufficient documentation

## 2016-11-09 DIAGNOSIS — Z8041 Family history of malignant neoplasm of ovary: Secondary | ICD-10-CM | POA: Insufficient documentation

## 2016-11-09 DIAGNOSIS — E669 Obesity, unspecified: Secondary | ICD-10-CM | POA: Insufficient documentation

## 2016-11-09 DIAGNOSIS — I2510 Atherosclerotic heart disease of native coronary artery without angina pectoris: Secondary | ICD-10-CM | POA: Diagnosis not present

## 2016-11-09 NOTE — Progress Notes (Signed)
Vitals stable. Denies pain. Pre seed IPSS 9. Post seed IPSS 8. Denies dysuria or hematuria. Reports occasional leakage associated with urgency. Scheduled to follow up with Dr. Jeffie Pollock tomorrow.   BP (!) 149/77 (BP Location: Right Arm, Patient Position: Sitting, Cuff Size: Normal)   Pulse 62   Resp 16   SpO2 100%  Wt Readings from Last 3 Encounters:  10/20/16 215 lb 8 oz (97.8 kg)  07/07/16 221 lb 12.8 oz (100.6 kg)  02/22/16 215 lb (97.5 kg)

## 2016-11-09 NOTE — Progress Notes (Signed)
  Radiation Oncology         [phone] ________________________________  Name: Robert [name] MRN: [id number]  Date: 11/09/2016  DOB: 1940/08/27  COMPLEX SIMULATION NOTE  NARRATIVE:  The patient was brought to the [facility] today following prostate seed implantation approximately one month ago.  Identity was confirmed.  All relevant records and images related to the planned course of therapy were reviewed.  Then, the patient was set-up supine.  CT images were obtained.  The CT images were loaded into the planning software.  Then the prostate and rectum were contoured.  Treatment planning then occurred.  The implanted iodine 125 seeds were identified by the physics staff for projection of radiation distribution  I have requested : 3D Simulation  I have requested a DVH of the following structures: Prostate and rectum.    ________________________________  Sheral Apley Tammi Klippel, M.D.

## 2016-11-09 NOTE — Progress Notes (Signed)
Radiation Oncology         [phone] ________________________________  Name: Robert [name] MRN: [id number]  Date: 11/09/2016  DOB: July 12, 1940  Post Treatment Note  CC: Laurey Morale, MD  Irine Seal, MD  Diagnosis:   76 year-old gentleman with clinical Stage T2a adenocarcinoma of the prostate, Gleason's Score 3+3, PSA 4.64  Interval Since Last Radiation:  3 weeks  10/20/2016:  Insertion of radioactive I-125 seeds into the prostate gland, 145 Gy, definitive therapy.  Narrative:  The patient returns today for routine follow-up.  He tolerated the procedure well and was able to discharge home without a Foley catheter.                              On review of systems, the patient states he is doing very well and has not experienced any ill effects. He specifically denies dysuria, increased frequency, urgency, urinary incontinence or gross hematuria. He continues with normal bowel movement daily and has not experienced any loose stool or diarrhea. He reports a single episode of gross hematuria with a 10 to peak in the urine the day of the procedure. He has not had any further gross hematuria. He also reports a single episode of minimal blood streaking in stool on postop day 1 or 2 and has not seen any further. He denies abdominal pain, nausea, vomiting or diarrhea. He reports a healthy appetite and is maintaining his weight. He has not noticed any appreciable change in his energy level.  ALLERGIES:  has No Known Allergies.  Meds: Current Outpatient Prescriptions  Medication Sig Dispense Refill  . aspirin 81 MG tablet Take 81 mg by mouth daily.      Marland Kitchen atenolol-chlorthalidone (TENORETIC) 50-25 MG tablet Take 1 tablet by mouth daily (Patient taking differently: Take 1 tablet by mouth every morning. ) 90 tablet 2  . atorvastatin (LIPITOR) 40 MG tablet Take 1 tablet (40 mg total) by mouth daily. (Patient taking differently: Take 40 mg by mouth every morning. ) 90 tablet 3  . ciprofloxacin  (CIPRO) 500 MG tablet Take 1 tablet (500 mg total) by mouth 2 (two) times daily. 6 tablet 0  . docusate sodium (COLACE) 100 MG capsule Take 1 capsule (100 mg total) by mouth 2 (two) times daily. 60 capsule 2  . losartan (COZAAR) 100 MG tablet Take 1 tablet by mouth daily (Patient taking differently: Take 1 tablet by mouth daily--  takes in am) 90 tablet 2  . potassium chloride (K-DUR) 10 MEQ tablet Take 1 tablet by mouth  every day (Patient taking differently: Take 10 mEq by mouth every morning. Take 1 tablet by mouth  every day) 90 tablet 3  . tadalafil (CIALIS) 20 MG tablet Take 1 tablet (20 mg total) by mouth daily as needed. 30 tablet 3  . traMADol (ULTRAM) 50 MG tablet Take 1 tablet (50 mg total) by mouth every 6 (six) hours as needed. 8 tablet 0   No current facility-administered medications for this encounter.     Physical Findings:  blood pressure is 149/77 (abnormal) and his pulse is 62. His respiration is 16 and oxygen saturation is 100%.  Pain Assessment Pain Score: 0-No pain/10 In general this is a well appearing Caucasian male in no acute distress. He's alert and oriented x4 and appropriate throughout the examination. Cardiopulmonary assessment is negative for acute distress and he exhibits normal effort.   Lab Findings: Lab Results  Component  Value Date   WBC 6.8 10/13/2016   HGB 15.5 10/13/2016   HCT 47.1 10/13/2016   MCV 95.9 10/13/2016   PLT 196 10/13/2016     Radiographic Findings: Dg C-arm 1-60 Min-no Report  Result Date: 10/20/2016 Fluoroscopy was utilized by the requesting physician.  No radiographic interpretation.    Impression/Plan: 19. 76 year-old gentleman with clinical Stage T2a adenocarcinoma of the prostate, Gleason's Score 3+3, PSA 4.64. The patient is recovering well from the effects radiation. He has not experienced any increased or bothersome lower urinary tract symptoms and is quite pleased with his outcome.  Today we spent time talking to the  patient and his wife about his prostate seed implant and resolving urinary symptoms. We also talked about long-term follow-up of prostate cancer following seed implant. He understands that ongoing PSA determinations and digital rectal exams will help perform surveillance to rule out disease recurrence. He understands what to expect with his PSA measures going forward. He was also educated about some of the long-term effects from radiation including a small risk for rectal bleeding and possible erectile dysfunction. We talked about some of the general management approaches to these potential complications. His post-implant CT images were reviewed by Dr. Tammi Klippel and appear to have excellent coverage.  A copy of the detailed report will be forwarded to his urologist, Dr. Jeffie Pollock.  He is encouraged to contact our office or return at any point if he has any questions or concerns related to his previous radiation or prostate cancer. We look forward to continuing to follow his response to treatment through urology correspondence and are happy to see him back as needed.     Nicholos Johns, PA-C

## 2016-11-09 NOTE — Addendum Note (Signed)
Encounter addended by: Heywood Footman, RN on: 11/09/2016  2:47 PM<BR>    Actions taken: Charge Capture section accepted

## 2016-11-10 DIAGNOSIS — C61 Malignant neoplasm of prostate: Secondary | ICD-10-CM | POA: Diagnosis not present

## 2016-11-29 ENCOUNTER — Encounter: Payer: Self-pay | Admitting: Radiation Oncology

## 2016-11-29 DIAGNOSIS — C61 Malignant neoplasm of prostate: Secondary | ICD-10-CM | POA: Diagnosis not present

## 2016-12-04 NOTE — Progress Notes (Signed)
  Radiation Oncology         [phone] ________________________________  Name: Robert [name] MRN: [id number]  Date: 11/29/2016  DOB: 1941-02-08  3D Planning Note   Prostate Brachytherapy Post-Implant Dosimetry  Diagnosis: 76 y.o. gentleman with clinical Stage T2a adenocarcinoma of the prostate, Gleason's Score 3+3, PSA 4.64  Narrative: On a previous date, [name] returned following prostate seed implantation for post implant planning. He underwent CT scan complex simulation to delineate the three-dimensional structures of the pelvis and demonstrate the radiation distribution.  Since that time, the seed localization, and complex isodose planning with dose volume histograms have now been completed.  Results:   Prostate Coverage - The dose of radiation delivered to the 90% or more of the prostate gland (D90) was 88.88% of the prescription dose. This is essentially at the level of our goal of greater than 90%. Rectal Sparing - The volume of rectal tissue receiving the prescription dose or higher was 0.0 cc. This falls under our thresholds tolerance of 1.0 cc.  Impression: The prostate seed implant appears to show adequate target coverage and appropriate rectal sparing.  Plan:  The patient will continue to follow with urology for ongoing PSA determinations. I would anticipate a high likelihood for local tumor control with minimal risk for rectal morbidity.  ________________________________  Sheral Apley Tammi Klippel, M.D.

## 2016-12-16 NOTE — Addendum Note (Signed)
Addendum  created 12/16/16 1301 by Kirk Basquez, MD   Sign clinical note    

## 2016-12-16 NOTE — Anesthesia Postprocedure Evaluation (Signed)
Anesthesia Post Note  Patient: Robert [name]  Procedure(s) Performed: Procedure(s) (LRB): RADIOACTIVE SEED IMPLANT/BRACHYTHERAPY IMPLANT, 71 seeds implanted, no seeds found in bladder ([name])     Anesthesia Post Evaluation  Last Vitals:  Vitals:   10/20/16 1215 10/20/16 1304  BP: (!) 138/55 (!) 148/65  Pulse: 64 63  Resp: 16 16  Temp:  36.4 C    Last Pain:  Vitals:   10/21/16 0951  TempSrc:   PainSc: 2                  Leighana Neyman Maston

## 2016-12-27 DIAGNOSIS — E669 Obesity, unspecified: Secondary | ICD-10-CM | POA: Diagnosis not present

## 2016-12-27 DIAGNOSIS — Z510 Encounter for antineoplastic radiation therapy: Secondary | ICD-10-CM | POA: Diagnosis not present

## 2016-12-27 DIAGNOSIS — Z7982 Long term (current) use of aspirin: Secondary | ICD-10-CM | POA: Diagnosis not present

## 2016-12-27 DIAGNOSIS — Z803 Family history of malignant neoplasm of breast: Secondary | ICD-10-CM | POA: Diagnosis not present

## 2016-12-27 DIAGNOSIS — N402 Nodular prostate without lower urinary tract symptoms: Secondary | ICD-10-CM | POA: Diagnosis not present

## 2016-12-27 DIAGNOSIS — I2510 Atherosclerotic heart disease of native coronary artery without angina pectoris: Secondary | ICD-10-CM | POA: Diagnosis not present

## 2016-12-27 DIAGNOSIS — Z8041 Family history of malignant neoplasm of ovary: Secondary | ICD-10-CM | POA: Diagnosis not present

## 2016-12-27 DIAGNOSIS — C61 Malignant neoplasm of prostate: Secondary | ICD-10-CM | POA: Diagnosis not present

## 2016-12-27 DIAGNOSIS — I10 Essential (primary) hypertension: Secondary | ICD-10-CM | POA: Diagnosis not present

## 2016-12-27 DIAGNOSIS — Z951 Presence of aortocoronary bypass graft: Secondary | ICD-10-CM | POA: Diagnosis not present

## 2016-12-27 DIAGNOSIS — E785 Hyperlipidemia, unspecified: Secondary | ICD-10-CM | POA: Diagnosis not present

## 2016-12-27 DIAGNOSIS — Z8042 Family history of malignant neoplasm of prostate: Secondary | ICD-10-CM | POA: Diagnosis not present

## 2016-12-27 DIAGNOSIS — N529 Male erectile dysfunction, unspecified: Secondary | ICD-10-CM | POA: Diagnosis not present

## 2017-02-16 DIAGNOSIS — C61 Malignant neoplasm of prostate: Secondary | ICD-10-CM | POA: Diagnosis not present

## 2017-02-23 ENCOUNTER — Encounter: Payer: Self-pay | Admitting: Family Medicine

## 2017-02-23 DIAGNOSIS — C61 Malignant neoplasm of prostate: Secondary | ICD-10-CM | POA: Diagnosis not present

## 2017-02-23 DIAGNOSIS — N3941 Urge incontinence: Secondary | ICD-10-CM | POA: Diagnosis not present

## 2017-02-23 DIAGNOSIS — R350 Frequency of micturition: Secondary | ICD-10-CM | POA: Diagnosis not present

## 2017-03-10 ENCOUNTER — Encounter: Payer: Self-pay | Admitting: Family Medicine

## 2017-03-10 ENCOUNTER — Ambulatory Visit (INDEPENDENT_AMBULATORY_CARE_PROVIDER_SITE_OTHER): Payer: PPO | Admitting: Family Medicine

## 2017-03-10 VITALS — BP 160/86 | Temp 98.1°F | Ht 67.0 in | Wt 218.0 lb

## 2017-03-10 DIAGNOSIS — Z0000 Encounter for general adult medical examination without abnormal findings: Secondary | ICD-10-CM

## 2017-03-10 DIAGNOSIS — Z23 Encounter for immunization: Secondary | ICD-10-CM

## 2017-03-10 DIAGNOSIS — E1140 Type 2 diabetes mellitus with diabetic neuropathy, unspecified: Secondary | ICD-10-CM | POA: Diagnosis not present

## 2017-03-10 LAB — BASIC METABOLIC PANEL
BUN: 18 mg/dL (ref 6–23)
CHLORIDE: 98 meq/L (ref 96–112)
CO2: 31 meq/L (ref 19–32)
Calcium: 9.8 mg/dL (ref 8.4–10.5)
Creatinine, Ser: 0.91 mg/dL (ref 0.40–1.50)
GFR: 86.47 mL/min (ref >60.00)
GLUCOSE: 119 mg/dL — AB (ref 70–99)
Potassium: 4.2 mEq/L (ref 3.5–5.1)
SODIUM: 138 meq/L (ref 135–145)

## 2017-03-10 LAB — CBC WITH DIFFERENTIAL/PLATELET
BASOS PCT: 0.6 % (ref 0.0–3.0)
Basophils Absolute: 0 10*3/uL (ref 0.0–0.1)
EOS PCT: 3.6 % (ref 0.0–5.0)
Eosinophils Absolute: 0.3 10*3/uL (ref 0.0–0.7)
HCT: 49.8 % (ref 39.0–52.0)
HEMOGLOBIN: 16.5 g/dL (ref 13.0–17.0)
LYMPHS ABS: 1.4 10*3/uL (ref 0.7–4.0)
Lymphocytes Relative: 20.6 % (ref 12.0–46.0)
MCHC: 33.1 g/dL (ref 30.0–36.0)
MCV: 96.2 fl (ref 78.0–100.0)
MONO ABS: 0.4 10*3/uL (ref 0.1–1.0)
Monocytes Relative: 6.3 % (ref 3.0–12.0)
NEUTROS ABS: 4.8 10*3/uL (ref 1.4–7.7)
Neutrophils Relative %: 68.9 % (ref 43.0–77.0)
PLATELETS: 218 10*3/uL (ref 150.0–400.0)
RBC: 5.17 Mil/uL (ref 4.22–5.81)
RDW: 13.1 % (ref 11.5–15.5)
WBC: 7 10*3/uL (ref 4.0–10.5)

## 2017-03-10 LAB — TSH: TSH: 3.51 u[IU]/mL (ref 0.35–4.50)

## 2017-03-10 LAB — POC URINALSYSI DIPSTICK (AUTOMATED)
BILIRUBIN UA: NEGATIVE
CLARITY UA: NEGATIVE
GLUCOSE UA: NEGATIVE
KETONES UA: NEGATIVE
Leukocytes, UA: NEGATIVE
Nitrite, UA: NEGATIVE
SPEC GRAV UA: 1.025 (ref 1.010–1.025)
Urobilinogen, UA: 0.2 E.U./dL
pH, UA: 6 (ref 5.0–8.0)

## 2017-03-10 LAB — LIPID PANEL
CHOLESTEROL: 152 mg/dL (ref 0–200)
HDL: 34 mg/dL — AB (ref >39.00)
LDL Cholesterol: 86 mg/dL (ref 0–99)
NONHDL: 117.94
Total CHOL/HDL Ratio: 4
Triglycerides: 158 mg/dL — ABNORMAL HIGH (ref 0.0–149.0)
VLDL: 31.6 mg/dL (ref 0.0–40.0)

## 2017-03-10 LAB — HEPATIC FUNCTION PANEL
ALBUMIN: 4.6 g/dL (ref 3.5–5.2)
ALK PHOS: 78 U/L (ref 39–117)
ALT: 26 U/L (ref 0–53)
AST: 21 U/L (ref 0–37)
BILIRUBIN DIRECT: 0.1 mg/dL (ref 0.0–0.3)
TOTAL PROTEIN: 7.1 g/dL (ref 6.0–8.3)
Total Bilirubin: 0.7 mg/dL (ref 0.2–1.2)

## 2017-03-10 LAB — HEMOGLOBIN A1C: HEMOGLOBIN A1C: 6.2 % (ref 4.6–6.5)

## 2017-03-10 NOTE — Progress Notes (Signed)
   Subjective:    Patient ID: Robert [name], male    DOB: 1941/01/05, 76 y.o.   MRN: [id number]  HPI Here for a well exam. He is doing well in general. He had radioactive seeds placed in his prostate by Dr. Jeffie Pollock in May and these are managed by Dr. Tyler Pita. He has no discomfort or bleeding, but he has lost continence of his urine to some degree. He wears Depends. His most recent PSA is down to 0.33. His BP at home is stable in the 140s over 80s.    Review of Systems  Constitutional: Negative.   HENT: Negative.   Eyes: Negative.   Respiratory: Negative.   Cardiovascular: Negative.   Gastrointestinal: Negative.   Genitourinary: Negative.   Musculoskeletal: Negative.   Skin: Negative.   Neurological: Negative.   Psychiatric/Behavioral: Negative.        Objective:   Physical Exam  Constitutional: He is oriented to person, place, and time. He appears well-developed and well-nourished. No distress.  HENT:  Head: Normocephalic and atraumatic.  Right Ear: External ear normal.  Left Ear: External ear normal.  Nose: Nose normal.  Mouth/Throat: Oropharynx is clear and moist. No oropharyngeal exudate.  Eyes: Pupils are equal, round, and reactive to light. Conjunctivae and EOM are normal. Right eye exhibits no discharge. Left eye exhibits no discharge. No scleral icterus.  Neck: Neck supple. No JVD present. No tracheal deviation present. No thyromegaly present.  Cardiovascular: Normal rate, regular rhythm, normal heart sounds and intact distal pulses.  Exam reveals no gallop and no friction rub.   No murmur heard. Pulmonary/Chest: Effort normal and breath sounds normal. No respiratory distress. He has no wheezes. He has no rales. He exhibits no tenderness.  Abdominal: Soft. Bowel sounds are normal. He exhibits no distension and no mass. There is no tenderness. There is no rebound and no guarding.  Musculoskeletal: Normal range of motion. He exhibits no edema or tenderness.    Lymphadenopathy:    He has no cervical adenopathy.  Neurological: He is alert and oriented to person, place, and time. He has normal reflexes. No cranial nerve deficit. He exhibits normal muscle tone. Coordination normal.  Skin: Skin is warm and dry. No rash noted. He is not diaphoretic. No erythema. No pallor.  Psychiatric: He has a normal mood and affect. His behavior is normal. Judgment and thought content normal.          Assessment & Plan:  Well exam. We discussed diet and exercise. Get fasting labs.  [name], MD

## 2017-03-10 NOTE — Patient Instructions (Signed)
WE NOW OFFER   [facility] FAST TRACK!!!  SAME DAY Appointments for ACUTE CARE  Such as: Sprains, Injuries, cuts, abrasions, rashes, muscle pain, joint pain, back pain Colds, flu, sore throats, headache, allergies, cough, fever  Ear pain, sinus and eye infections Abdominal pain, nausea, vomiting, diarrhea, upset stomach Animal/insect bites  3 Easy Ways to Schedule: Walk-In Scheduling Call in scheduling Mychart Sign-up: [email or web address]         

## 2017-04-04 ENCOUNTER — Encounter: Payer: Self-pay | Admitting: Family Medicine

## 2017-04-05 ENCOUNTER — Telehealth: Payer: Self-pay | Admitting: Family Medicine

## 2017-04-05 NOTE — Telephone Encounter (Signed)
This would have no effect on his potassium levels at all

## 2017-04-05 NOTE — Telephone Encounter (Signed)
Sent Rx to [facility]

## 2017-04-05 NOTE — Telephone Encounter (Signed)
I added Oxbutynin 5 mg take 1 po qd to chart.

## 2017-06-22 DIAGNOSIS — C61 Malignant neoplasm of prostate: Secondary | ICD-10-CM | POA: Diagnosis not present

## 2017-06-29 DIAGNOSIS — N3941 Urge incontinence: Secondary | ICD-10-CM | POA: Diagnosis not present

## 2017-06-29 DIAGNOSIS — C61 Malignant neoplasm of prostate: Secondary | ICD-10-CM | POA: Diagnosis not present

## 2017-06-29 DIAGNOSIS — R350 Frequency of micturition: Secondary | ICD-10-CM | POA: Diagnosis not present

## 2017-07-08 ENCOUNTER — Other Ambulatory Visit: Payer: Self-pay | Admitting: Family Medicine

## 2017-07-12 NOTE — Telephone Encounter (Signed)
Patient's atenolol-chlorthalidone (TENORETIC) 50-25 MG tablet prescription was sent to his mail order pharmacy and it will take them a while to process it.  The patient wants to know if enough can be called in to his preferred local pharmacy [facility] [place] to last until he receives his prescription from the mail order pharmacy.

## 2017-07-12 NOTE — Telephone Encounter (Signed)
I called in a 30 day supply to local [facility].

## 2017-07-12 NOTE — Telephone Encounter (Signed)
Patient wants enough to know if this tenoretic can be called to [facility] in [place] until his mail order prescription arrives.

## 2017-08-29 ENCOUNTER — Ambulatory Visit (INDEPENDENT_AMBULATORY_CARE_PROVIDER_SITE_OTHER): Payer: PPO | Admitting: Orthopaedic Surgery

## 2017-08-29 ENCOUNTER — Ambulatory Visit (INDEPENDENT_AMBULATORY_CARE_PROVIDER_SITE_OTHER): Payer: PPO

## 2017-08-29 ENCOUNTER — Encounter (INDEPENDENT_AMBULATORY_CARE_PROVIDER_SITE_OTHER): Payer: Self-pay | Admitting: Orthopaedic Surgery

## 2017-08-29 DIAGNOSIS — M1711 Unilateral primary osteoarthritis, right knee: Secondary | ICD-10-CM | POA: Diagnosis not present

## 2017-08-29 DIAGNOSIS — M25561 Pain in right knee: Secondary | ICD-10-CM

## 2017-08-29 DIAGNOSIS — G8929 Other chronic pain: Secondary | ICD-10-CM

## 2017-08-29 MED ORDER — METHYLPREDNISOLONE ACETATE 40 MG/ML IJ SUSP
40.0000 mg | INTRAMUSCULAR | Status: AC | PRN
  Administered 2017-08-29: 40 mg via INTRA_ARTICULAR

## 2017-08-29 MED ORDER — LIDOCAINE HCL 1 % IJ SOLN
3.0000 mL | INTRAMUSCULAR | Status: AC | PRN
  Administered 2017-08-29: 3 mL

## 2017-08-29 NOTE — Progress Notes (Signed)
Office Visit Note   Patient: Robert [name]           Date of Birth: 08/03/1940           MRN: [id number] Visit Date: 08/29/2017              Requested by: Robert Morale, MD [place], [place] [address] PCP: Robert Morale, MD   Assessment & Plan: Visit Diagnoses:  1. Chronic pain of right knee   2. Unilateral primary osteoarthritis, right knee     Plan: Given the fact that injections have help temporize his pain significantly in the past we want to try steroid injection again today.  He understands the risk and benefits of this.  He is also the perfect candidate for hyaluronic acid.  He tolerated steroid injection well.  We will see him back in a month to place a hyaluronic acid injection into his right knee.  All questions and concerns were answered and addressed.  Follow-Up Instructions: Return in about 1 month (around 09/26/2017).   Orders:  Orders Placed This Encounter  Procedures  . Large Joint Inj  . XR Knee 1-2 Views Right   No orders of the defined types were placed in this encounter.     Procedures: Large Joint Inj: R knee on 08/29/2017 9:56 AM Indications: diagnostic evaluation and pain Details: 22 G 1.5 in needle, superolateral approach  Arthrogram: No  Medications: 3 mL lidocaine 1 %; 40 mg methylPREDNISolone acetate 40 MG/ML Outcome: tolerated well, no immediate complications Procedure, treatment alternatives, risks and benefits explained, specific risks discussed. Consent was given by the patient. Immediately prior to procedure a time out was called to verify the correct patient, procedure, equipment, support staff and site/side marked as required. Patient was prepped and draped in the usual sterile fashion.       Clinical Data: No additional findings.   Subjective: Chief Complaint  Patient presents with  . Right Knee - Pain  Robert [name] is here today to have his right knee evaluated.  We have seen him before remotely for his  knee and provide a steroid injection in his last very long period of time.  His pain now is become daily again affecting his mobility and his activities daily living detrimentally.  He like to consider another injection.  He said no other acute changes in medical status.  HPI  Review of Systems He currently denies any headache, chest pain, shortness of breath, fever, chills, nausea, vomiting.  Objective: Vital Signs: There were no vitals taken for this visit.  Physical Exam He is alert and oriented x3 and in no acute distress Ortho Exam Examination of his right knee shows patellofemoral crepitation.  There is no malalignment.  There is no effusion.  His range of motion is full.  His knee feels ligamentously stable. Specialty Comments:  No specialty comments available.  Imaging: Xr Knee 1-2 Views Right  Result Date: 08/29/2017 3 views of the right knee shows severe arthritic changes.  There is almost complete loss of lateral joint space.  There are periarticular osteophytes throughout the knee.    PMFS History: Patient Active Problem List   Diagnosis Date Noted  . Unilateral primary osteoarthritis, right knee 08/29/2017  . Type 2 diabetes mellitus with diabetic neuropathy, unspecified ([place]) 03/10/2017  . Neuropathy, diabetic ([place]) 03/10/2017  . Malignant neoplasm of prostate ([place]) 07/07/2016  . OBESITY 01/08/2009  . EDEMA 01/08/2009  . ERECTILE DYSFUNCTION 04/24/2008  . BPH  with urinary obstruction 04/24/2008  . Hyperlipidemia 04/13/2007  . Essential hypertension 04/13/2007  . Coronary atherosclerosis 04/13/2007   Past Medical History:  Diagnosis Date  . CAD (coronary artery disease)    per pt cardiologist Robert [name], last office visit 02-05-2010, currently followed by pcp , Robert [name]  . Diverticulosis of colon   . ED (erectile dysfunction) of non-organic origin   . History of colon polyps    02/ 2009 benign  . Hyperlipidemia   . Hypertension   . Nodular prostate with  lower urinary tract symptoms   . Prostate cancer [facility]) urologist-  Robert [name]/  oncologist-  Robert [name]   dx 06-15-2016,  Stage T2a, Gleason 3+3,  PSA 4.64,  vol 23.4cc  . S/P CABG x 4 05/02/2005   LIMA to LAD,  radial graft to CFx marginal,  SVG to diagonal and RCA  . Wears glasses     Family History  Problem Relation Age of Onset  . Sudden death Unknown   . Cancer Maternal Aunt        ovarian  . Cancer Maternal Uncle        prostate  . Cancer Maternal Grandmother        breast  . Cancer Maternal Grandfather        prostate  . Cancer Maternal Uncle        prostate  . Cancer Maternal Uncle        prostate  . Cancer Maternal Uncle        prostate    Past Surgical History:  Procedure Laterality Date  . CARDIAC CATHETERIZATION  04-29-2005  Robert w. Albertine [name]   severe 2V CAD-- 90% LAD diagonal, 80% CFx ostium,  50% pRCA  . COLONOSCOPY  08/03/2007   per Robert [name] Plan, benign polyp   . CORONARY ARTERY BYPASS GRAFT  05-02-2005  Robert Robert [name] tright   LIMA to LAD,  radial graft to CFx marginal,  SVG to diagonal and RCA  . EXICIOSN MASS INDEX FINGER Right 07/2015   per pt benign  . PROSTATE BIOPSY  06/15/2016  . RADIOACTIVE SEED IMPLANT [name] 10/20/2016   Procedure: RADIOACTIVE SEED IMPLANT/BRACHYTHERAPY IMPLANT, 71 seeds implanted, no seeds found in bladder;  Surgeon: Robert Seal, MD;  Location: [facility];  Service: Urology;  Laterality: [name];   Social History   Occupational History  . Not on file  Tobacco Use  . Smoking status: Never Smoker  . Smokeless tobacco: Never Used  Substance and Sexual Activity  . Alcohol use: No    Alcohol/week: 0.0 oz  . Drug use: No  . Sexual activity: Not on file

## 2017-08-30 ENCOUNTER — Telehealth (INDEPENDENT_AMBULATORY_CARE_PROVIDER_SITE_OTHER): Payer: Self-pay

## 2017-08-30 NOTE — Telephone Encounter (Signed)
Submitted application online for Monovisc, right knee. 

## 2017-09-27 ENCOUNTER — Telehealth (INDEPENDENT_AMBULATORY_CARE_PROVIDER_SITE_OTHER): Payer: Self-pay

## 2017-09-27 NOTE — Telephone Encounter (Signed)
Talked with patient's insurance, [employer] and was advised that no authorization is required for [id number].  Covered at 80%, patient will be responsible for 20% which could be an estimate of $350.00 OOP Buy & Bill Right Knee $20.00 co-pay

## 2017-10-03 ENCOUNTER — Ambulatory Visit (INDEPENDENT_AMBULATORY_CARE_PROVIDER_SITE_OTHER): Payer: PPO | Admitting: Orthopaedic Surgery

## 2017-10-03 ENCOUNTER — Encounter (INDEPENDENT_AMBULATORY_CARE_PROVIDER_SITE_OTHER): Payer: Self-pay | Admitting: Orthopaedic Surgery

## 2017-10-03 DIAGNOSIS — M1711 Unilateral primary osteoarthritis, right knee: Secondary | ICD-10-CM

## 2017-10-03 MED ORDER — HYALURONAN 88 MG/4ML IX SOSY
88.0000 mg | PREFILLED_SYRINGE | INTRA_ARTICULAR | Status: AC | PRN
  Administered 2017-10-03: 88 mg via INTRA_ARTICULAR

## 2017-10-03 MED ORDER — LIDOCAINE HCL 1 % IJ SOLN
1.0000 mL | INTRAMUSCULAR | Status: AC | PRN
  Administered 2017-10-03: 1 mL

## 2017-10-03 NOTE — Progress Notes (Signed)
   Procedure Note  Patient: Robert [name]             Date of Birth: [birth year]           MRN: [id number]             Visit Date: 10/03/2017  Procedures: Visit Diagnoses: Unilateral primary osteoarthritis, right knee  Large Joint Inj: R knee on 10/03/2017 9:11 AM Indications: pain and diagnostic evaluation Details: 22 G 1.5 in needle, superolateral approach  Arthrogram: No  Medications: 88 mg Hyaluronan 88 MG/4ML; 1 mL lidocaine 1 % Outcome: tolerated well, no immediate complications Procedure, treatment alternatives, risks and benefits explained, specific risks discussed. Consent was given by the patient. Immediately prior to procedure a time out was called to verify the correct patient, procedure, equipment, support staff and site/side marked as required. Patient was prepped and draped in the usual sterile fashion.    [name] is here today for scheduled hyaluronic acid injection with Monovisc in his right knee to treat moderate osteoarthritis.  He does have pain associated with this and is done well steroid injections.  This is the next logical step for him and he wished to have this done.  We talked about the risk minutes of these injections.  On examination of his right knee there is no effusion at all with excellent range of motion.  He tolerated injection well.  All questions concerns were answered and addressed.  He knows he can always get a steroid injection in 3 months from now if needed.

## 2017-10-11 DIAGNOSIS — C61 Malignant neoplasm of prostate: Secondary | ICD-10-CM | POA: Diagnosis not present

## 2017-10-18 DIAGNOSIS — N3941 Urge incontinence: Secondary | ICD-10-CM | POA: Diagnosis not present

## 2017-10-18 DIAGNOSIS — K5903 Drug induced constipation: Secondary | ICD-10-CM | POA: Diagnosis not present

## 2017-10-18 DIAGNOSIS — Z8546 Personal history of malignant neoplasm of prostate: Secondary | ICD-10-CM | POA: Diagnosis not present

## 2017-10-18 DIAGNOSIS — N403 Nodular prostate with lower urinary tract symptoms: Secondary | ICD-10-CM | POA: Diagnosis not present

## 2017-11-29 ENCOUNTER — Other Ambulatory Visit: Payer: Self-pay | Admitting: Family Medicine

## 2018-01-02 ENCOUNTER — Other Ambulatory Visit: Payer: Self-pay | Admitting: Family Medicine

## 2018-02-02 DIAGNOSIS — S61401A Unspecified open wound of right hand, initial encounter: Secondary | ICD-10-CM | POA: Diagnosis not present

## 2018-02-06 ENCOUNTER — Encounter (INDEPENDENT_AMBULATORY_CARE_PROVIDER_SITE_OTHER): Payer: Self-pay | Admitting: Orthopaedic Surgery

## 2018-02-06 ENCOUNTER — Ambulatory Visit (INDEPENDENT_AMBULATORY_CARE_PROVIDER_SITE_OTHER): Payer: PPO | Admitting: Orthopaedic Surgery

## 2018-02-06 DIAGNOSIS — S61411A Laceration without foreign body of right hand, initial encounter: Secondary | ICD-10-CM

## 2018-02-06 NOTE — Progress Notes (Signed)
Robert [name] needed me to check on his hand today.  He injured this hand 5 days ago with a laceration to his right palm when he was working with a broom.  He received a tetanus shot and then they placed Steri-Strips over laceration of the palm of his hand.  He denies any numbness and tingling.  They have him on Keflex as well.  He came here today to make sure everything was okay with it.  On exam I remove the Steri-Strips because they were already coming off.  He has 1 cm curved wound in the palm of his hand closer to the hyperthenar area.  He is neurovascular intact.  There is no evidence of infection.  He just needs local wound care at this point.  We will take up to a month for this to heal and eventually will scar down and have no issues.  They are concerned about how it is gaping opening but this is too late to place sutures in.  Should do well with soaking it once a day and antibacterial soapy water followed by mupirocin ointment and a dry dressing.  If there is any issues he will let us know but follow-up can be as needed.

## 2018-02-20 ENCOUNTER — Other Ambulatory Visit: Payer: Self-pay | Admitting: *Deleted

## 2018-02-20 MED ORDER — LOSARTAN POTASSIUM 100 MG PO TABS: 100.0000 mg | ORAL_TABLET | Freq: Every day | ORAL | 0 refills | Status: DC

## 2018-03-12 NOTE — Progress Notes (Addendum)
Subjective:   Robert [name] is a 77 y.o. male who presents for Medicare Annual/Subsequent preventive examination.  Reports health as fair Hurt hand right hand DM Prostate cancer 06/15/2016 CABG x 4 2006 was 240 and dropped 217  224 lb  BP elevated 148 but has not had meds  Ninfa Linden ordered tamsulosin for now    1 sister; mother died when he was 65 days old Older sister took him in  2 children / girls Grands 3   Diet BMI 35  A1c 6.2 and BS 119 Breakfast boiled eggs and toast, waffle, cheerios Lunch; sandwich; tomato  Supper baked chicken and vegetables  Varies salmon patties Go to eat on Friday  Beef once a week   Chol/hdl 4;   Exercise Walks with wife 3 to 4 times a week Keeps busy Shots to knee recent   Health Maintenance Due  Topic Date Due  . FOOT EXAM  10/16/1952  . OPHTHALMOLOGY EXAM  10/16/1952  . COLONOSCOPY  08/02/2017  . HEMOGLOBIN A1C  09/08/2017  . INFLUENZA VACCINE  01/04/2018   Colonoscopy 07/2007 - 07/2017 - we are postponing this year   Eye exam this past year/ ? [place] [facility] Dr. Arva Chafe  Foot exam neg; has some pain and tingling at times - stops when he moves around  Flu vaccine given today  Educated regarding the shingrix   Cardiac Risk Factors include: advanced age (>87men, >30 women);diabetes mellitus;dyslipidemia;family history of premature cardiovascular disease;hypertension;male gender;obesity (BMI >30kg/m2)     Objective:    Vitals: BP (!) 148/78   Pulse (!) 57   Ht 5\' 7"  (1.702 m)   Wt 224 lb (101.6 kg)   SpO2 93%   BMI 35.08 kg/m   Body mass index is 35.08 kg/m.  Advanced Directives 03/13/2018 10/20/2016  Does Patient Have a Medical Advance Directive? No No  Would patient like information on creating a medical advance directive? - No - Patient declined   In process of completing  Tobacco Social History   Tobacco Use  Smoking Status Never Smoker  Smokeless Tobacco Never Used     Counseling given:  Yes   Clinical Intake:     Past Medical History:  Diagnosis Date  . CAD (coronary artery disease)    per pt cardiologist dr Johnsie Cancel, last office visit 02-05-2010, currently followed by pcp , dr fry  . Diverticulosis of colon   . ED (erectile dysfunction) of non-organic origin   . History of colon polyps    02/ 2009 benign  . Hyperlipidemia   . Hypertension   . Nodular prostate with lower urinary tract symptoms   . Prostate cancer [facility]) urologist-  dr wrenn/  oncologist-  dr Tammi Klippel   dx 06-15-2016,  Stage T2a, Gleason 3+3,  PSA 4.64,  vol 23.4cc  . S/P CABG x 4 05/02/2005   LIMA to LAD,  radial graft to CFx marginal,  SVG to diagonal and RCA  . Wears glasses    Past Surgical History:  Procedure Laterality Date  . CARDIAC CATHETERIZATION  04-29-2005  dr w. Albertine Patricia   severe 2V CAD-- 90% LAD diagonal, 80% CFx ostium,  50% pRCA  . COLONOSCOPY  08/03/2007   per Dr. Fuller Plan, benign polyp   . CORONARY ARTERY BYPASS GRAFT  05-02-2005  dr Lucianne Lei tright   LIMA to LAD,  radial graft to CFx marginal,  SVG to diagonal and RCA  . EXICIOSN MASS INDEX FINGER Right 07/2015   per pt benign  .  PROSTATE BIOPSY  06/15/2016  . RADIOACTIVE SEED IMPLANT [name] 10/20/2016   Procedure: RADIOACTIVE SEED IMPLANT/BRACHYTHERAPY IMPLANT, 71 seeds implanted, no seeds found in bladder;  Surgeon: Irine Seal, MD;  Location: [facility];  Service: Urology;  Laterality: [name];   Family History  Problem Relation Age of Onset  . Sudden death Unknown   . Cancer Maternal Aunt        ovarian  . Cancer Maternal Uncle        prostate  . Cancer Maternal Grandmother        breast  . Cancer Maternal Grandfather        prostate  . Cancer Maternal Uncle        prostate  . Cancer Maternal Uncle        prostate  . Cancer Maternal Uncle        prostate   Social History   Socioeconomic History  . Marital status: Married    Spouse name: Not on file  . Number of children: Not on file  . Years of education:  Not on file  . Highest education level: Not on file  Occupational History  . Not on file  Social Needs  . Financial resource strain: Not on file  . Food insecurity:    Worry: Not on file    Inability: Not on file  . Transportation needs:    Medical: Not on file    Non-medical: Not on file  Tobacco Use  . Smoking status: Never Smoker  . Smokeless tobacco: Never Used  Substance and Sexual Activity  . Alcohol use: No    Alcohol/week: 0.0 standard drinks  . Drug use: No  . Sexual activity: Not on file  Lifestyle  . Physical activity:    Days per week: Not on file    Minutes per session: Not on file  . Stress: Not on file  Relationships  . Social connections:    Talks on phone: Not on file    Gets together: Not on file    Attends religious service: Not on file    Active member of club or organization: Not on file    Attends meetings of clubs or organizations: Not on file    Relationship status: Not on file  Other Topics Concern  . Not on file  Social History Narrative  . Not on file    Outpatient Encounter Medications as of 03/13/2018  Medication Sig  . aspirin 81 MG tablet Take 81 mg by mouth daily.    Marland Kitchen atenolol-chlorthalidone (TENORETIC) 50-25 MG tablet Take 1 tablet by mouth daily  . atorvastatin (LIPITOR) 40 MG tablet Take 1 tablet by mouth daily  . losartan (COZAAR) 100 MG tablet Take 1 tablet (100 mg total) by mouth daily.  . potassium chloride (K-DUR) 10 MEQ tablet Take 1 tablet by mouth every day  . tamsulosin (FLOMAX) 0.4 MG CAPS capsule Take 0.4 mg by mouth.  . docusate sodium (COLACE) 100 MG capsule Take 1 capsule (100 mg total) by mouth 2 (two) times daily. (Patient not taking: Reported on 03/13/2018)  . mirabegron ER (MYRBETRIQ) 25 MG TB24 tablet Take 25 mg by mouth daily.  Marland Kitchen oxybutynin (DITROPAN) 5 MG tablet Take 5 mg by mouth daily.   No facility-administered encounter medications on file as of 03/13/2018.     Activities of Daily Living In your present  state of health, do you have any difficulty performing the following activities: 03/13/2018  Hearing? N  Vision? N  Difficulty concentrating or making decisions? N  Walking or climbing stairs? N  Dressing or bathing? N  Doing errands, shopping? N  Preparing Food and eating ? N  Using the Toilet? N  In the past six months, have you accidently leaked urine? Y  Comment frequency being treated  Do you have problems with loss of bowel control? N  Managing your Medications? N  Managing your Finances? N  Housekeeping or managing your Housekeeping? N  Some recent data might be hidden    Patient Care Team: Laurey Morale, MD as PCP - General   Assessment:   This is a routine wellness examination for Robert [name].  Exercise Activities and Dietary recommendations Current Exercise Habits: Home exercise routine, Type of exercise: walking, Time (Minutes): 45, Frequency (Times/Week): 4, Weekly Exercise (Minutes/Week): 180, Intensity: Moderate  Goals    . Patient Stated     Lose some weight  Did cardiac rehab was very good and helpful  Cut back on fried foods          Fall Risk Fall Risk  03/13/2018 07/07/2016 07/07/2016 02/22/2016 01/06/2015  Falls in the past year? No No No No No     Depression Screen PHQ 2/9 Scores 03/13/2018 07/07/2016 07/07/2016 02/22/2016  PHQ - 2 Score 0 0 0 0    Cognitive Function MMSE - Mini Mental State Exam 03/13/2018  Not completed: (No Data)     Ad8 score reviewed for issues:  Issues making decisions:  Less interest in hobbies / activities:  Repeats questions, stories (family complaining):  Trouble using ordinary gadgets (microwave, computer, phone):  Forgets the month or year:   Mismanaging finances:   Remembering appts:  Daily problems with thinking and/or memory: Ad8 score is=0      Immunization History  Administered Date(s) Administered  . Influenza Split 03/06/2013  . Influenza Whole 04/13/2007, 03/06/2009  . Influenza, High Dose Seasonal PF  02/22/2016, 03/10/2017, 03/13/2018  . Influenza-Unspecified 11/13/2014  . Pneumococcal Conjugate-13 02/22/2016  . Pneumococcal Polysaccharide-23 04/24/2008  . Td 02/02/2018  . Tdap 08/13/2010      Screening Tests Health Maintenance  Topic Date Due  . FOOT EXAM  10[birth year]16/1950  . OPHTHALMOLOGY EXAM  10/16/1952  . COLONOSCOPY  08/02/2017  . HEMOGLOBIN A1C  09/08/2017  . INFLUENZA VACCINE  01/04/2018  . TETANUS/TDAP  08/12/2020  . PNA vac Low Risk Adult  Completed         Plan:      PCP Notes   Health Maintenance Colonoscopy 07/2007 - 07/2017 - we are postponing this year   Eye exam this past year/ ? [place] Dr. Arva Chafe  Foot exam neg; has some pain and tingling at times - stops when he moves around  Flu vaccine given today  Educated regarding the shingrix    Abnormal Screens  As noted  Referrals  None  Patient concerns; rec'ing injections to knee; would like to lose weight. Seeds implanted for prostate cancer  Nurse Concerns; As noted Educated regarding per diabetes and discussed weight loss  Next PCP apt Is being seen today       I have personally reviewed and noted the following in the patient's chart:   . Medical and social history . Use of alcohol, tobacco or illicit drugs  . Current medications and supplements . Functional ability and status . Nutritional status . Physical activity . Advanced directives . List of other physicians . Hospitalizations, surgeries, and ER visits in previous 12 months . Vitals .  Screenings to include cognitive, depression, and falls . Referrals and appointments  In addition, I have reviewed and discussed with patient certain preventive protocols, quality metrics, and best practice recommendations. A written personalized care plan for preventive services as well as general preventive health recommendations were provided to patient.     [email or web address], RN  03/13/2018  I have read this note and agree with  its contents.  [name], MD

## 2018-03-13 ENCOUNTER — Encounter: Payer: Self-pay | Admitting: Family Medicine

## 2018-03-13 ENCOUNTER — Ambulatory Visit (INDEPENDENT_AMBULATORY_CARE_PROVIDER_SITE_OTHER): Payer: PPO

## 2018-03-13 ENCOUNTER — Ambulatory Visit (INDEPENDENT_AMBULATORY_CARE_PROVIDER_SITE_OTHER): Payer: PPO | Admitting: Family Medicine

## 2018-03-13 VITALS — BP 148/78 | HR 57 | Ht 67.0 in | Wt 224.0 lb

## 2018-03-13 DIAGNOSIS — R739 Hyperglycemia, unspecified: Secondary | ICD-10-CM

## 2018-03-13 DIAGNOSIS — Z23 Encounter for immunization: Secondary | ICD-10-CM

## 2018-03-13 DIAGNOSIS — Z0000 Encounter for general adult medical examination without abnormal findings: Secondary | ICD-10-CM | POA: Diagnosis not present

## 2018-03-13 LAB — BASIC METABOLIC PANEL
BUN: 26 mg/dL — ABNORMAL HIGH (ref 6–23)
CO2: 33 meq/L — AB (ref 19–32)
Calcium: 10 mg/dL (ref 8.4–10.5)
Chloride: 101 mEq/L (ref 96–112)
Creatinine, Ser: 0.87 mg/dL (ref 0.40–1.50)
GFR: 90.82 mL/min (ref >60.00)
GLUCOSE: 122 mg/dL — AB (ref 70–99)
Potassium: 4.5 mEq/L (ref 3.5–5.1)
Sodium: 141 mEq/L (ref 135–145)

## 2018-03-13 LAB — CBC WITH DIFFERENTIAL/PLATELET
BASOS ABS: 0 10*3/uL (ref 0.0–0.1)
BASOS PCT: 0.6 % (ref 0.0–3.0)
EOS ABS: 0.3 10*3/uL (ref 0.0–0.7)
Eosinophils Relative: 3.3 % (ref 0.0–5.0)
HEMATOCRIT: 48.3 % (ref 39.0–52.0)
Hemoglobin: 16.1 g/dL (ref 13.0–17.0)
LYMPHS ABS: 1.6 10*3/uL (ref 0.7–4.0)
Lymphocytes Relative: 20.8 % (ref 12.0–46.0)
MCHC: 33.3 g/dL (ref 30.0–36.0)
MCV: 96.5 fl (ref 78.0–100.0)
MONOS PCT: 6.4 % (ref 3.0–12.0)
Monocytes Absolute: 0.5 10*3/uL (ref 0.1–1.0)
NEUTROS ABS: 5.5 10*3/uL (ref 1.4–7.7)
NEUTROS PCT: 68.9 % (ref 43.0–77.0)
PLATELETS: 196 10*3/uL (ref 150.0–400.0)
RBC: 5.01 Mil/uL (ref 4.22–5.81)
RDW: 13.3 % (ref 11.5–15.5)
WBC: 7.9 10*3/uL (ref 4.0–10.5)

## 2018-03-13 LAB — LIPID PANEL
CHOL/HDL RATIO: 4
Cholesterol: 141 mg/dL (ref 0–200)
HDL: 32.2 mg/dL — ABNORMAL LOW (ref >39.00)
LDL CALC: 81 mg/dL (ref 0–99)
NonHDL: 108.8
TRIGLYCERIDES: 140 mg/dL (ref 0.0–149.0)
VLDL: 28 mg/dL (ref 0.0–40.0)

## 2018-03-13 LAB — HEPATIC FUNCTION PANEL
ALBUMIN: 4.4 g/dL (ref 3.5–5.2)
ALK PHOS: 72 U/L (ref 39–117)
ALT: 25 U/L (ref 0–53)
AST: 20 U/L (ref 0–37)
BILIRUBIN DIRECT: 0.1 mg/dL (ref 0.0–0.3)
TOTAL PROTEIN: 7 g/dL (ref 6.0–8.3)
Total Bilirubin: 0.6 mg/dL (ref 0.2–1.2)

## 2018-03-13 LAB — TSH: TSH: 2.12 u[IU]/mL (ref 0.35–4.50)

## 2018-03-13 LAB — HEMOGLOBIN A1C: Hgb A1c MFr Bld: 6.3 % (ref 4.6–6.5)

## 2018-03-13 MED ORDER — ATENOLOL-CHLORTHALIDONE 50-25 MG PO TABS: 1.0000 | ORAL_TABLET | Freq: Every day | ORAL | 3 refills | Status: DC

## 2018-03-13 MED ORDER — LOSARTAN POTASSIUM 100 MG PO TABS: 100.0000 mg | ORAL_TABLET | Freq: Every day | ORAL | 3 refills | Status: DC

## 2018-03-13 MED ORDER — ATORVASTATIN CALCIUM 40 MG PO TABS: 40.0000 mg | ORAL_TABLET | Freq: Every day | ORAL | 3 refills | Status: DC

## 2018-03-13 MED ORDER — POTASSIUM CHLORIDE ER 10 MEQ PO TBCR: 10.0000 meq | EXTENDED_RELEASE_TABLET | Freq: Every day | ORAL | 3 refills | Status: DC

## 2018-03-13 NOTE — Patient Instructions (Addendum)
Robert [name] , Thank you for taking time to come for your Medicare Wellness Visit. I appreciate your ongoing commitment to your health goals. Please review the following plan we discussed and let me know if I can assist you in the future.   Will take your high dose flu vaccine today   Check with Dr. [name] about the Colonoscopy   Shingrix is a vaccine for the prevention of Shingles in Adults 50 and older.  If you are on Medicare, the shingrix is covered under your Part D plan, so you will take both of the vaccines in the series at your pharmacy. Please check with your benefits regarding applicable copays or out of pocket expenses.  The Shingrix is given in 2 vaccines approx 8 weeks apart. You must receive the 2nd dose prior to 6 months from receipt of the first. Please have the pharmacist print out you Immunization  dates for our office records    These are the goals we discussed:  Goals    . Patient Stated     Lose some weight  Did cardiac rehab was very good and helpful  Cut back on fried foods          This is a list of the screening recommended for you and due dates:  Health Maintenance  Topic Date Due  . Complete foot exam   10/16/1950  . Eye exam for diabetics  10/16/1950  . Colon Cancer Screening  08/02/2017  . Hemoglobin A1C  09/08/2017  . Flu Shot  01/04/2018  . Tetanus Vaccine  08/12/2020  . Pneumonia vaccines  Completed      Fall Prevention in the Home Falls can cause injuries. They can happen to people of all ages. There are many things you can do to make your home safe and to help prevent falls. What can I do on the outside of my home?  Regularly fix the edges of walkways and driveways and fix any cracks.  Remove anything that might make you trip as you walk through a door, such as a raised step or threshold.  Trim any bushes or trees on the path to your home.  Use bright outdoor lighting.  Clear any walking paths of anything that might make someone trip,  such as rocks or tools.  Regularly check to see if handrails are loose or broken. Make sure that both sides of any steps have handrails.  Any raised decks and porches should have guardrails on the edges.  Have any leaves, snow, or ice cleared regularly.  Use sand or salt on walking paths during winter.  Clean up any spills in your garage right away. This includes oil or grease spills. What can I do in the bathroom?  Use night lights.  Install grab bars by the toilet and in the tub and shower. Do not use towel bars as grab bars.  Use non-skid mats or decals in the tub or shower.  If you need to sit down in the shower, use a plastic, non-slip stool.  Keep the floor dry. Clean up any water that spills on the floor as soon as it happens.  Remove soap buildup in the tub or shower regularly.  Attach bath mats securely with double-sided non-slip rug tape.  Do not have throw rugs and other things on the floor that can make you trip. What can I do in the bedroom?  Use night lights.  Make sure that you have a light by your bed that is  easy to reach.  Do not use any sheets or blankets that are too big for your bed. They should not hang down onto the floor.  Have a firm chair that has side arms. You can use this for support while you get dressed.  Do not have throw rugs and other things on the floor that can make you trip. What can I do in the kitchen?  Clean up any spills right away.  Avoid walking on wet floors.  Keep items that you use a lot in easy-to-reach places.  If you need to reach something above you, use a strong step stool that has a grab bar.  Keep electrical cords out of the way.  Do not use floor polish or wax that makes floors slippery. If you must use wax, use non-skid floor wax.  Do not have throw rugs and other things on the floor that can make you trip. What can I do with my stairs?  Do not leave any items on the stairs.  Make sure that there are  handrails on both sides of the stairs and use them. Fix handrails that are broken or loose. Make sure that handrails are as long as the stairways.  Check any carpeting to make sure that it is firmly attached to the stairs. Fix any carpet that is loose or worn.  Avoid having throw rugs at the top or bottom of the stairs. If you do have throw rugs, attach them to the floor with carpet tape.  Make sure that you have a light switch at the top of the stairs and the bottom of the stairs. If you do not have them, ask someone to add them for you. What else can I do to help prevent falls?  Wear shoes that: ? Do not have high heels. ? Have rubber bottoms. ? Are comfortable and fit you well. ? Are closed at the toe. Do not wear sandals.  If you use a stepladder: ? Make sure that it is fully opened. Do not climb a closed stepladder. ? Make sure that both sides of the stepladder are locked into place. ? Ask someone to hold it for you, if possible.  Clearly mark and make sure that you can see: ? Any grab bars or handrails. ? First and last steps. ? Where the edge of each step is.  Use tools that help you move around (mobility aids) if they are needed. These include: ? Canes. ? Walkers. ? Scooters. ? Crutches.  Turn on the lights when you go into a dark area. Replace any light bulbs as soon as they burn out.  Set up your furniture so you have a clear path. Avoid moving your furniture around.  If any of your floors are uneven, fix them.  If there are any pets around you, be aware of where they are.  Review your medicines with your doctor. Some medicines can make you feel dizzy. This can increase your chance of falling. Ask your doctor what other things that you can do to help prevent falls. This information is not intended to replace advice given to you by your health care provider. Make sure you discuss any questions you have with your health care provider. Document Released: 03/19/2009  Document Revised: 10/29/2015 Document Reviewed: 06/27/2014 Elsevier Interactive Patient Education  2018 [place] Maintenance, Male A healthy lifestyle and preventive care is important for your health and wellness. Ask your health care provider about what schedule of regular  examinations is right for you. What should I know about weight and diet? Eat a Healthy Diet  Eat plenty of vegetables, fruits, whole grains, low-fat dairy products, and lean protein.  Do not eat a lot of foods high in solid fats, added sugars, or salt.  Maintain a Healthy Weight Regular exercise can help you achieve or maintain a healthy weight. You should:  Do at least 150 minutes of exercise each week. The exercise should increase your heart rate and make you sweat (moderate-intensity exercise).  Do strength-training exercises at least twice a week.  Watch Your Levels of Cholesterol and Blood Lipids  Have your blood tested for lipids and cholesterol every 5 years starting at 75 years of age. If you are at high risk for heart disease, you should start having your blood tested when you are 75 years old. You may need to have your cholesterol levels checked more often if: ? Your lipid or cholesterol levels are high. ? You are older than 75 years of age. ? You are at high risk for heart disease.  What should I know about cancer screening? Many types of cancers can be detected early and may often be prevented. Lung Cancer  You should be screened every year for lung cancer if: ? You are a current smoker who has smoked for at least 30 years. ? You are a former smoker who has quit within the past 15 years.  Talk to your health care provider about your screening options, when you should start screening, and how often you should be screened.  Colorectal Cancer  Routine colorectal cancer screening usually begins at 75 years of age and should be repeated every 5-10 years until you are 75 years old. You  may need to be screened more often if early forms of precancerous polyps or small growths are found. Your health care provider may recommend screening at an earlier age if you have risk factors for colon cancer.  Your health care provider may recommend using home test kits to check for hidden blood in the stool.  A small camera at the end of a tube can be used to examine your colon (sigmoidoscopy or colonoscopy). This checks for the earliest forms of colorectal cancer.  Prostate and Testicular Cancer  Depending on your age and overall health, your health care provider may do certain tests to screen for prostate and testicular cancer.  Talk to your health care provider about any symptoms or concerns you have about testicular or prostate cancer.  Skin Cancer  Check your skin from head to toe regularly.  Tell your health care provider about any new moles or changes in moles, especially if: ? There is a change in a mole's size, shape, or color. ? You have a mole that is larger than a pencil eraser.  Always use sunscreen. Apply sunscreen liberally and repeat throughout the day.  Protect yourself by wearing long sleeves, pants, a wide-brimmed hat, and sunglasses when outside.  What should I know about heart disease, diabetes, and high blood pressure?  If you are 76-75 years of age, have your blood pressure checked every 3-5 years. If you are 16 years of age or older, have your blood pressure checked every year. You should have your blood pressure measured twice-once when you are at a hospital or clinic, and once when you are not at a hospital or clinic. Record the average of the two measurements. To check your blood pressure when you are not at a  hospital or clinic, you can use: ? An automated blood pressure machine at a pharmacy. ? A home blood pressure monitor.  Talk to your health care provider about your target blood pressure.  If you are between 49-54 years old, ask your health care  provider if you should take aspirin to prevent heart disease.  Have regular diabetes screenings by checking your fasting blood sugar level. ? If you are at a normal weight and have a low risk for diabetes, have this test once every three years after the age of 47. ? If you are overweight and have a high risk for diabetes, consider being tested at a younger age or more often.  A one-time screening for abdominal aortic aneurysm (AAA) by ultrasound is recommended for men aged 31-75 years who are current or former smokers. What should I know about preventing infection? Hepatitis B If you have a higher risk for hepatitis B, you should be screened for this virus. Talk with your health care provider to find out if you are at risk for hepatitis B infection. Hepatitis C Blood testing is recommended for:  Everyone born from [birth year] through 1963.  Anyone with known risk factors for hepatitis C.  Sexually Transmitted Diseases (STDs)  You should be screened each year for STDs including gonorrhea and chlamydia if: ? You are sexually active and are younger than 75 years of age. ? You are older than 75 years of age and your health care provider tells you that you are at risk for this type of infection. ? Your sexual activity has changed since you were last screened and you are at an increased risk for chlamydia or gonorrhea. Ask your health care provider if you are at risk.  Talk with your health care provider about whether you are at high risk of being infected with HIV. Your health care provider may recommend a prescription medicine to help prevent HIV infection.  What else can I do?  Schedule regular health, dental, and eye exams.  Stay current with your vaccines (immunizations).  Do not use any tobacco products, such as cigarettes, chewing tobacco, and e-cigarettes. If you need help quitting, ask your health care provider.  Limit alcohol intake to no more than 2 drinks per day. One drink equals 12  ounces of beer, 5 ounces of wine, or 1 ounces of hard liquor.  Do not use street drugs.  Do not share needles.  Ask your health care provider for help if you need support or information about quitting drugs.  Tell your health care provider if you often feel depressed.  Tell your health care provider if you have ever been abused or do not feel safe at home. This information is not intended to replace advice given to you by your health care provider. Make sure you discuss any questions you have with your health care provider. Document Released: 11/19/2007 Document Revised: 01/20/2016 Document Reviewed: 02/24/2015 Elsevier Interactive Patient Education  [employer].

## 2018-03-13 NOTE — Progress Notes (Signed)
  Subjective:     Patient ID: Robert [name], male   DOB: February 28, 1941, 77 y.o.   MRN: [id number]  HPI Duplicate   Review of Systems     Objective:   Physical Exam     Assessment:         Plan:

## 2018-03-13 NOTE — Progress Notes (Signed)
   Subjective:    Patient ID: Robert [name], male    DOB: 1941/05/22, 77 y.o.   MRN: [id number]  HPI Here for a well exam. He feels fine in general. He sees Dr. Roni Bread for a hx of prostate cancer and also for some urinary incontinence. He is currently taking Tamsulocin. He has tried, Oxybutynin, Myrbetriq, and other medications but nothing really helps. He now wears Depends every day.    Review of Systems  Constitutional: Negative.   HENT: Negative.   Eyes: Negative.   Respiratory: Negative.   Cardiovascular: Negative.   Gastrointestinal: Negative.   Genitourinary: Negative.   Musculoskeletal: Negative.   Skin: Negative.   Neurological: Negative.   Psychiatric/Behavioral: Negative.        Objective:   Physical Exam  Constitutional: He is oriented to person, place, and time. He appears well-developed and well-nourished. No distress.  HENT:  Head: Normocephalic and atraumatic.  Right Ear: External ear normal.  Left Ear: External ear normal.  Nose: Nose normal.  Mouth/Throat: Oropharynx is clear and moist. No oropharyngeal exudate.  Eyes: Pupils are equal, round, and reactive to light. Conjunctivae and EOM are normal. Right eye exhibits no discharge. Left eye exhibits no discharge. No scleral icterus.  Neck: Neck supple. No JVD present. No tracheal deviation present. No thyromegaly present.  Cardiovascular: Normal rate, regular rhythm, normal heart sounds and intact distal pulses. Exam reveals no gallop and no friction rub.  No murmur heard. Pulmonary/Chest: Effort normal and breath sounds normal. No respiratory distress. He has no wheezes. He has no rales. He exhibits no tenderness.  Abdominal: Soft. Bowel sounds are normal. He exhibits no distension and no mass. There is no tenderness. There is no rebound and no guarding.  Musculoskeletal: Normal range of motion. He exhibits no edema or tenderness.  Lymphadenopathy:    He has no cervical adenopathy.  Neurological: He is alert  and oriented to person, place, and time. He has normal reflexes. He displays normal reflexes. No cranial nerve deficit. He exhibits normal muscle tone. Coordination normal.  Skin: Skin is warm and dry. No rash noted. He is not diaphoretic. No erythema. No pallor.  Psychiatric: He has a normal mood and affect. His behavior is normal. Judgment and thought content normal.          Assessment & Plan:  Well exam. We discussed diet and exercise. Get fasting labs. He is due for a colonoscopy but he declines to get any more.  [name], MD

## 2018-03-15 ENCOUNTER — Encounter: Payer: Self-pay | Admitting: *Deleted

## 2018-04-10 DIAGNOSIS — Z8546 Personal history of malignant neoplasm of prostate: Secondary | ICD-10-CM | POA: Diagnosis not present

## 2018-04-17 DIAGNOSIS — N3941 Urge incontinence: Secondary | ICD-10-CM | POA: Diagnosis not present

## 2018-04-17 DIAGNOSIS — C61 Malignant neoplasm of prostate: Secondary | ICD-10-CM | POA: Diagnosis not present

## 2018-04-17 DIAGNOSIS — R351 Nocturia: Secondary | ICD-10-CM | POA: Diagnosis not present

## 2018-06-19 DIAGNOSIS — H40013 Open angle with borderline findings, low risk, bilateral: Secondary | ICD-10-CM | POA: Diagnosis not present

## 2018-10-24 DIAGNOSIS — C61 Malignant neoplasm of prostate: Secondary | ICD-10-CM | POA: Diagnosis not present

## 2018-10-31 DIAGNOSIS — N403 Nodular prostate with lower urinary tract symptoms: Secondary | ICD-10-CM | POA: Diagnosis not present

## 2018-10-31 DIAGNOSIS — Z8546 Personal history of malignant neoplasm of prostate: Secondary | ICD-10-CM | POA: Diagnosis not present

## 2018-10-31 DIAGNOSIS — N3941 Urge incontinence: Secondary | ICD-10-CM | POA: Diagnosis not present

## 2018-10-31 DIAGNOSIS — N5201 Erectile dysfunction due to arterial insufficiency: Secondary | ICD-10-CM | POA: Diagnosis not present

## 2018-11-13 IMAGING — CR DG CHEST 2V
2 series · 2 of 2 positions shown · non-contrast
Comparison: 05/07/2005

CLINICAL DATA: Preop examination

EXAM:
CHEST  2 VIEW

[w chest pa]
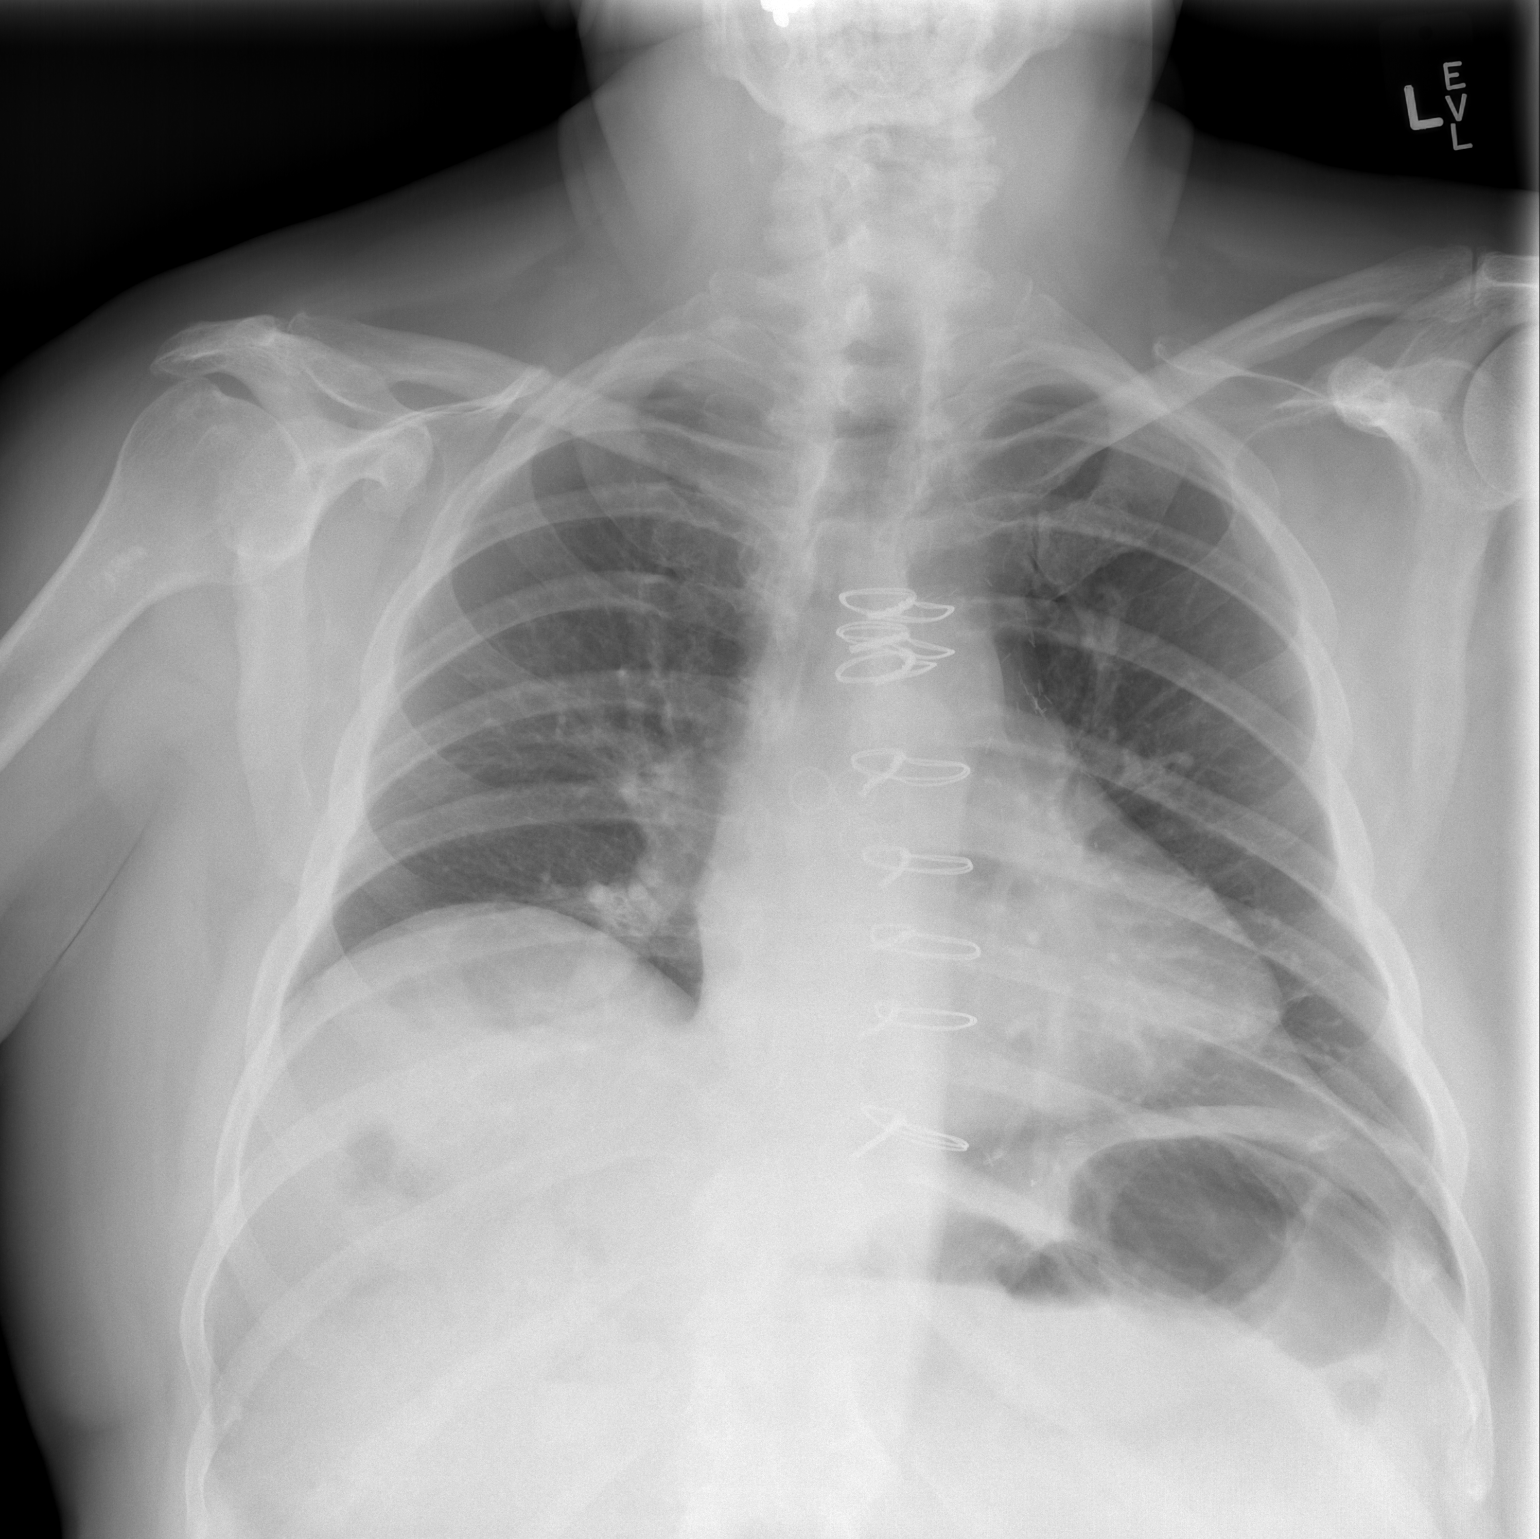

[w chest lat]
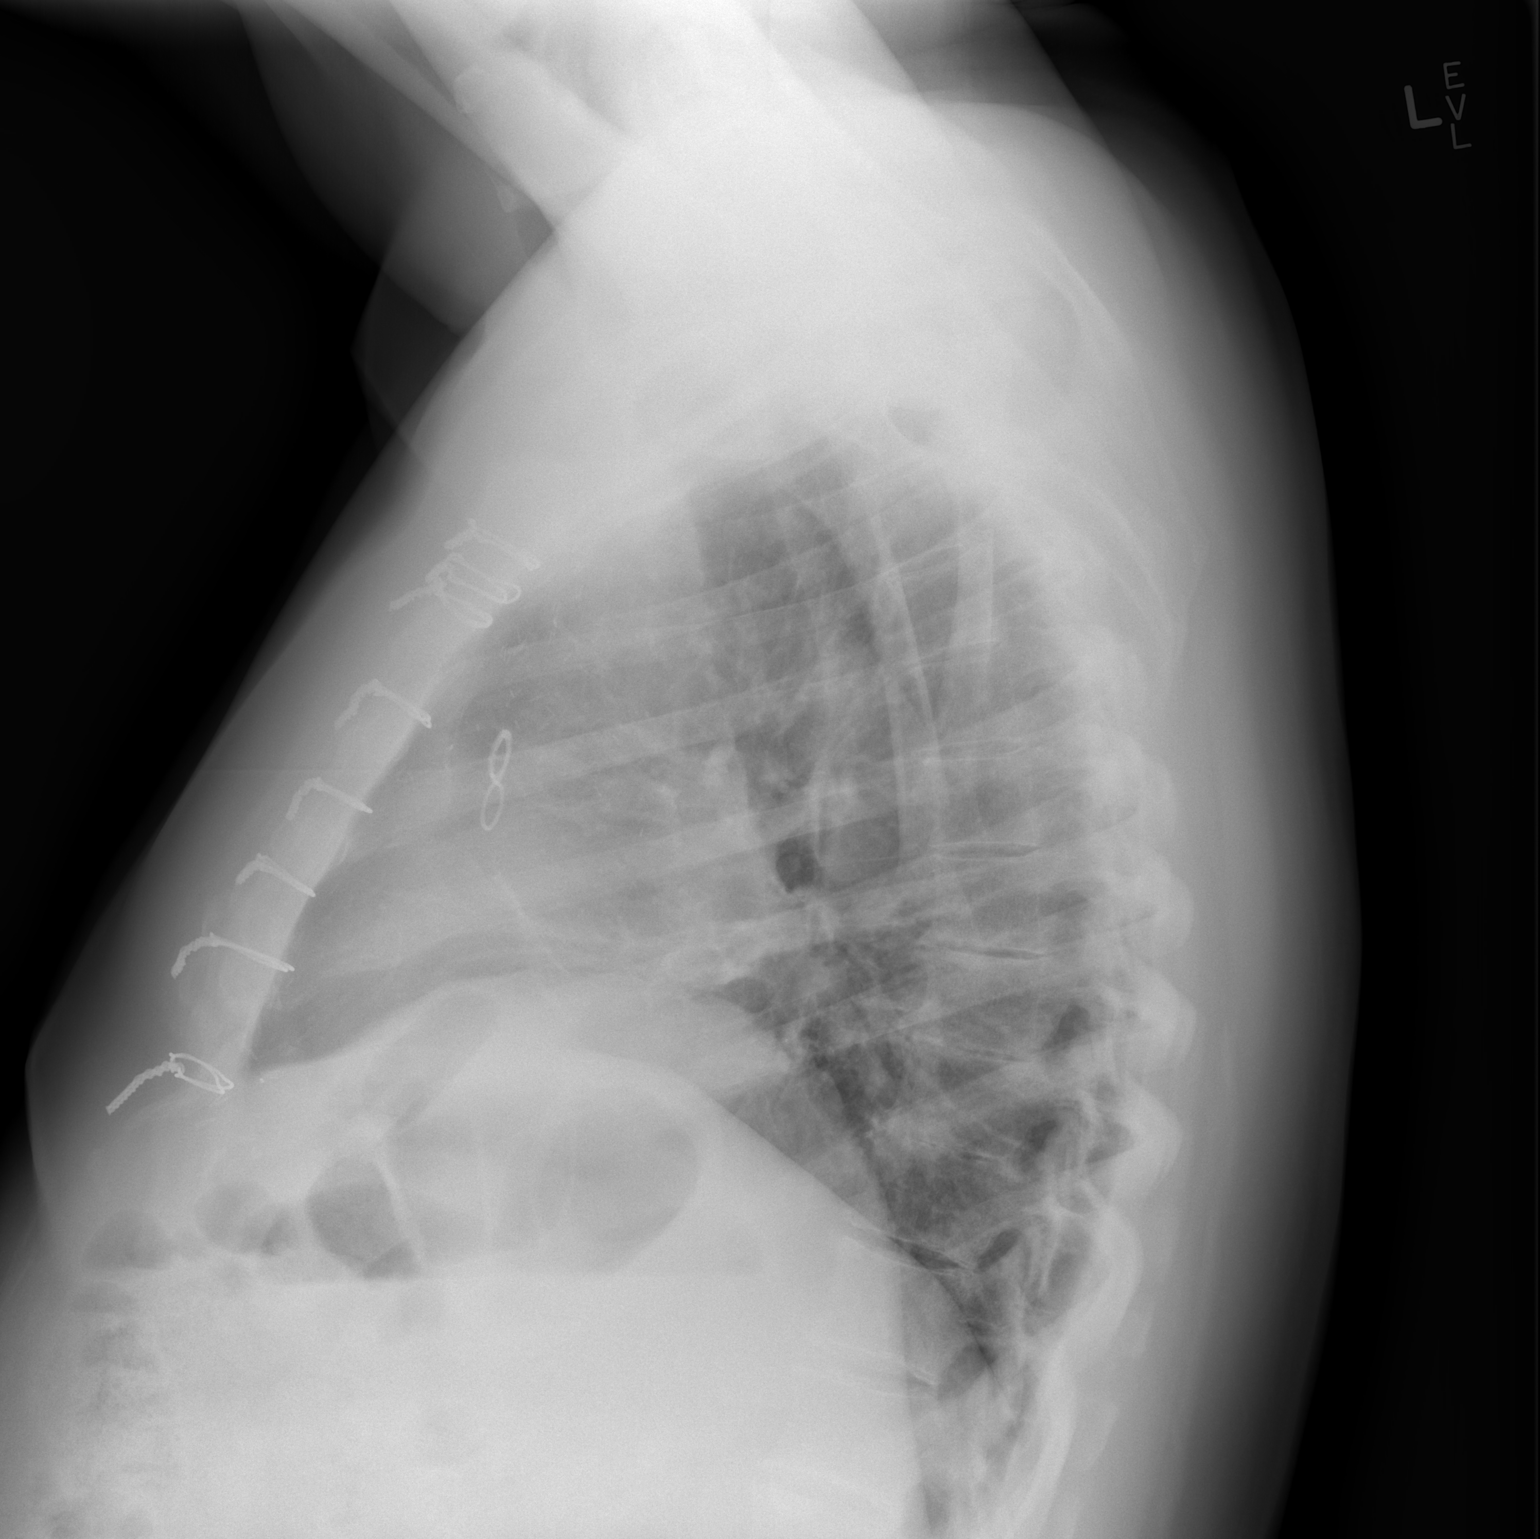

[2 of 2 positions shown; findings below may reference images not displayed]

FINDINGS: CABG. Negative for heart failure. Heart size within normal limits.
Mild right middle lobe atelectasis. Negative for infiltrate effusion
or mass lesion.
IMPRESSION: Mild right middle lobe atelectasis which may be chronic and is
unchanged from the prior study.

## 2018-12-18 ENCOUNTER — Ambulatory Visit (INDEPENDENT_AMBULATORY_CARE_PROVIDER_SITE_OTHER): Payer: PPO | Admitting: Orthopaedic Surgery

## 2018-12-18 ENCOUNTER — Ambulatory Visit (INDEPENDENT_AMBULATORY_CARE_PROVIDER_SITE_OTHER): Payer: PPO

## 2018-12-18 ENCOUNTER — Other Ambulatory Visit: Payer: Self-pay

## 2018-12-18 DIAGNOSIS — M79605 Pain in left leg: Secondary | ICD-10-CM

## 2018-12-18 MED ORDER — GABAPENTIN 100 MG PO CAPS: 100.0000 mg | ORAL_CAPSULE | Freq: Every day | ORAL | 1 refills | Status: DC

## 2018-12-18 MED ORDER — METHYLPREDNISOLONE 4 MG PO TABS: ORAL_TABLET | ORAL | 0 refills | Status: DC

## 2018-12-18 NOTE — Progress Notes (Signed)
Office Visit Note   Patient: Robert [name]           Date of Birth: [birth year]           MRN: [id number] Visit Date: 12/18/2018              Requested by: Laurey Morale, MD [place],  [place] [address] PCP: Laurey Morale, MD   Assessment & Plan: Visit Diagnoses:  1. Pain in left leg     Plan: Based on his clinical exam and x-ray findings I do feel that he has a component of sciatica and likely is getting irritation of a nerve in the lumbar spine is causing the symptoms.  I showed him his x-rays and explained in detail what this involves.  I would like to put him on just a low dose of gabapentin at 100 mg at night and a steroid taper.  Also did place a trigger point injection in his left posterior pelvis sciatic region which she understands may not help at all.  All question concerns were answered addressed.  I like to see him back in 2 weeks to see how he is responding to this medical treatment.  He does understand we may end up needing to obtain an MRI of his lumbar spine.  Follow-Up Instructions: Return in about 2 weeks (around 01/01/2019).   Orders:  Orders Placed This Encounter  Procedures  . XR HIP UNILAT W OR W/O PELVIS 1V LEFT  . XR Lumbar Spine 2-3 Views   Meds ordered this encounter  Medications  . methylPREDNISolone (MEDROL) 4 MG tablet    Sig: Medrol dose pack. Take as instructed    Dispense:  21 tablet    Refill:  0  . gabapentin (NEURONTIN) 100 MG capsule    Sig: Take 1 capsule (100 mg total) by mouth at bedtime.    Dispense:  30 capsule    Refill:  1      Procedures: No procedures performed   Clinical Data: No additional findings.   Subjective: Chief Complaint  Patient presents with  . Left Hip - Pain  . Left Leg - Pain  Patient comes in today with complaints of left hip pain but he points to sciatic region.  He says it is worse in the morning when he gets up and is been going on for several months now and slowly getting  worse.  He says it radiates down to behind his knee and down the side of his leg to the top of his foot.  He is borderline diabetic but does not have to take medications for this.  He does not really take a lot of anti-inflammatories.  He has had heart issues in the past and has had heart surgery.  He denies any change in bowel bladder function.  He says he can walk it off some during the day but it has been still slowly getting worse.  HPI  Review of Systems He currently denies any headache, chest pain, shortness of breath, fever, chills, nausea, vomiting  Objective: Vital Signs: There were no vitals taken for this visit.  Physical Exam He is alert and orient x3 and in no acute distress Ortho Exam Examination of his hips does show some slight stiffness with rotation of his left hip comparing his left and right hips but he has no groin pain at all.  There is no pain to palpation of the trochanteric area of his left hip but  there is some pain to palpation over the posterior pelvis and the sciatic region.  He has negative straight leg raise bilaterally and no gross weakness in his feet.  There is slight subjective decrease sensation on the top of his left foot comparing his left and right feet. Specialty Comments:  No specialty comments available.  Imaging: Xr Hip Unilat W Or W/o Pelvis 1v Left  Result Date: 12/18/2018 2 views of the pelvis and left hip show no acute findings.  There is only slight joint space narrowing at the superior lateral aspect of the left hip when comparing the left and right hips.  Xr Lumbar Spine 2-3 Views  Result Date: 12/18/2018 An AP and lateral lumbar spine shows significant degenerative changes throughout the lumbar spine with disc space narrowing at all levels.  There is large osteophytes around the vertebral bodies anteriorly and posteriorly with posterior element arthritic changes which is quite severe as well at the lower aspect of the lumbar spine.  The  alignment is well-maintained otherwise.    PMFS History: Patient Active Problem List   Diagnosis Date Noted  . Unilateral primary osteoarthritis, right knee 08/29/2017  . Type 2 diabetes mellitus with diabetic neuropathy, unspecified ([place]) 03/10/2017  . Neuropathy, diabetic ([place]) 03/10/2017  . Malignant neoplasm of prostate ([place]) 07/07/2016  . OBESITY 01/08/2009  . EDEMA 01/08/2009  . ERECTILE DYSFUNCTION 04/24/2008  . BPH with urinary obstruction 04/24/2008  . Hyperlipidemia 04/13/2007  . Essential hypertension 04/13/2007  . Coronary atherosclerosis 04/13/2007   Past Medical History:  Diagnosis Date  . CAD (coronary artery disease)    per pt cardiologist dr Johnsie Cancel, last office visit 02-05-2010, currently followed by pcp , dr fry  . Diverticulosis of colon   . ED (erectile dysfunction) of non-organic origin   . History of colon polyps    02/ 2009 benign  . Hyperlipidemia   . Hypertension   . Nodular prostate with lower urinary tract symptoms   . Prostate cancer [facility]) urologist-  dr wrenn/  oncologist-  dr Tammi Klippel   dx 06-15-2016,  Stage T2a, Gleason 3+3,  PSA 4.64,  vol 23.4cc  . S/P CABG x 4 05/02/2005   LIMA to LAD,  radial graft to CFx marginal,  SVG to diagonal and RCA  . Wears glasses     Family History  Problem Relation Age of Onset  . Sudden death Unknown   . Cancer Maternal Aunt        ovarian  . Cancer Maternal Uncle        prostate  . Cancer Maternal Grandmother        breast  . Cancer Maternal Grandfather        prostate  . Cancer Maternal Uncle        prostate  . Cancer Maternal Uncle        prostate  . Cancer Maternal Uncle        prostate    Past Surgical History:  Procedure Laterality Date  . CARDIAC CATHETERIZATION  04-29-2005  dr w. Albertine Patricia   severe 2V CAD-- 90% LAD diagonal, 80% CFx ostium,  50% pRCA  . COLONOSCOPY  08/03/2007   per Dr. Fuller Plan, benign polyp   . CORONARY ARTERY BYPASS GRAFT  05-02-2005  dr Lucianne Lei tright   LIMA to LAD,  radial  graft to CFx marginal,  SVG to diagonal and RCA  . EXICIOSN MASS INDEX FINGER Right 07/2015   per pt benign  . PROSTATE BIOPSY  06/15/2016  .  RADIOACTIVE SEED IMPLANT [name] 10/20/2016   Procedure: RADIOACTIVE SEED IMPLANT/BRACHYTHERAPY IMPLANT, 71 seeds implanted, no seeds found in bladder;  Surgeon: Irine Seal, MD;  Location: [facility];  Service: Urology;  Laterality: [name];   Social History   Occupational History  . Not on file  Tobacco Use  . Smoking status: Never Smoker  . Smokeless tobacco: Never Used  Substance and Sexual Activity  . Alcohol use: No    Alcohol/week: 0.0 standard drinks  . Drug use: No  . Sexual activity: Not on file

## 2019-01-01 ENCOUNTER — Other Ambulatory Visit: Payer: Self-pay

## 2019-01-01 ENCOUNTER — Ambulatory Visit (INDEPENDENT_AMBULATORY_CARE_PROVIDER_SITE_OTHER): Payer: PPO | Admitting: Orthopaedic Surgery

## 2019-01-01 ENCOUNTER — Encounter: Payer: Self-pay | Admitting: Orthopaedic Surgery

## 2019-01-01 DIAGNOSIS — G8929 Other chronic pain: Secondary | ICD-10-CM

## 2019-01-01 DIAGNOSIS — M5442 Lumbago with sciatica, left side: Secondary | ICD-10-CM

## 2019-01-01 DIAGNOSIS — M79605 Pain in left leg: Secondary | ICD-10-CM | POA: Diagnosis not present

## 2019-01-01 DIAGNOSIS — M5432 Sciatica, left side: Secondary | ICD-10-CM

## 2019-01-01 MED ORDER — GABAPENTIN 100 MG PO CAPS: 300.0000 mg | ORAL_CAPSULE | Freq: Every day | ORAL | 1 refills | Status: DC

## 2019-01-01 NOTE — Progress Notes (Signed)
Robert [name] is following up with continued left lower leg pain between the knee and ankle with numbness and tingling.  He also has a component of sciatica.  We will put him on Neurontin just 100 mg at night and I did give him a trigger point injection in his gluteal area.  This was on the left side.  He said that did help but he still having his symptoms of numbness in that left leg.  On exam there is no weakness in his left lower extremity he does have a positive straight leg raise.  There is subjective numbness along the lateral left leg.  I like to send him to physical therapy to see if there is modalities that he can work on his back such as McKenzie exercises and traction to see if this will reduce his symptoms.  On when I have him go up to 300 mg of Neurontin at night.  We will work on scheduling outpatient physical therapy with [facility] outpatient rehab.  I will then see him back in 4 weeks to see how he is doing overall.  All question concerns were answered and addressed.  Obviously if he is worsening in any way an MRI would be the next step to assess his lumbar spine

## 2019-01-02 ENCOUNTER — Other Ambulatory Visit: Payer: Self-pay

## 2019-01-02 DIAGNOSIS — G8929 Other chronic pain: Secondary | ICD-10-CM

## 2019-01-02 DIAGNOSIS — M5440 Lumbago with sciatica, unspecified side: Secondary | ICD-10-CM

## 2019-01-09 ENCOUNTER — Other Ambulatory Visit: Payer: Self-pay

## 2019-01-09 ENCOUNTER — Ambulatory Visit: Payer: PPO | Attending: Family Medicine | Admitting: Physical Therapy

## 2019-01-09 ENCOUNTER — Encounter: Payer: Self-pay | Admitting: Physical Therapy

## 2019-01-09 DIAGNOSIS — M6281 Muscle weakness (generalized): Secondary | ICD-10-CM

## 2019-01-09 DIAGNOSIS — G8929 Other chronic pain: Secondary | ICD-10-CM | POA: Insufficient documentation

## 2019-01-09 DIAGNOSIS — M5442 Lumbago with sciatica, left side: Secondary | ICD-10-CM | POA: Diagnosis not present

## 2019-01-09 NOTE — Therapy (Signed)
[place], [place], [address] Phone: [phone]   Fax:  [phone]  Physical Therapy Evaluation  Patient Details  Name: Robert [name] MRN: [id number] Date of Birth: 06-25-1940 Referring Provider (PT): Dr Jean Rosenthal   Encounter Date: 01/09/2019  PT End of Session - 01/09/19 0751    Visit Number  1    Number of Visits  12    Date for PT Re-Evaluation  02/20/19    Authorization Type  MCR - pnote at 10th visit    PT Start Time  0751    PT Stop Time  0848    PT Time Calculation (min)  57 min    Activity Tolerance  Patient tolerated treatment well    Behavior During Therapy  [facility] for tasks assessed/performed       Past Medical History:  Diagnosis Date  . CAD (coronary artery disease)    per pt cardiologist dr Johnsie Cancel, last office visit 02-05-2010, currently followed by pcp , dr fry  . Diverticulosis of colon   . ED (erectile dysfunction) of non-organic origin   . History of colon polyps    02/ 2009 benign  . Hyperlipidemia   . Hypertension   . Nodular prostate with lower urinary tract symptoms   . Prostate cancer [facility]) urologist-  dr wrenn/  oncologist-  dr Tammi Klippel   dx 06-15-2016,  Stage T2a, Gleason 3+3,  PSA 4.64,  vol 23.4cc  . S/P CABG x 4 05/02/2005   LIMA to LAD,  radial graft to CFx marginal,  SVG to diagonal and RCA  . Wears glasses     Past Surgical History:  Procedure Laterality Date  . CARDIAC CATHETERIZATION  04-29-2005  dr w. Albertine Patricia   severe 2V CAD-- 90% LAD diagonal, 80% CFx ostium,  50% pRCA  . COLONOSCOPY  08/03/2007   per Dr. Fuller Plan, benign polyp   . CORONARY ARTERY BYPASS GRAFT  05-02-2005  dr Lucianne Lei tright   LIMA to LAD,  radial graft to CFx marginal,  SVG to diagonal and RCA  . EXICIOSN MASS INDEX FINGER Right 07/2015   per pt benign  . PROSTATE BIOPSY  06/15/2016  . RADIOACTIVE SEED IMPLANT [name] 10/20/2016   Procedure: RADIOACTIVE SEED IMPLANT/BRACHYTHERAPY  IMPLANT, 71 seeds implanted, no seeds found in bladder;  Surgeon: Irine Seal, MD;  Location: [facility];  Service: Urology;  Laterality: [name];    There were no vitals filed for this visit.   Subjective Assessment - 01/09/19 0751    Subjective  Pt reports he is having pain into the Lt buttocks that began about 4-5 wks ago. He reports he has had intermittent back pain, had an injection, steroids and gabepetin and these have helped. The pain will go into this lateral lower Lt calf and top of foot every morning    Diagnostic tests  x-ray - DDD, MRI if PT doesn't help    Patient Stated Goals  walk without pain, make sure he doesnt fall ( has not yet)    Currently in Pain?  Yes    Pain Score  5     Pain Location  Buttocks    Pain Orientation  Left    Pain Descriptors / Indicators  Aching;Numbness    Pain Type  Chronic pain    Pain Radiating Towards  into foot    Pain Onset  More than a month ago    Pain Frequency  Constant    Aggravating Factors  first thing in AM    Pain Relieving Factors  moving around         [facility] PT Assessment - 01/09/19 0001      Assessment   Medical Diagnosis  LBP with Lt LE sciatica    Referring Provider (PT)  Dr Jean Rosenthal    Onset Date/Surgical Date  08/09/18    Hand Dominance  Right    Next MD Visit  after PT    Prior Therapy  none      Precautions   Precautions  None      Balance Screen   Has the patient fallen in the past 6 months  No    Has the patient had a decrease in activity level because of a fear of falling?   No    Is the patient reluctant to leave their home because of a fear of falling?   No      Home Film/video editor residence    Living Arrangements  Spouse/significant other    Home Layout  Two level   uses railing     Prior Function   Level of [place]  Retired    Leisure  walks 3 times a week, play golf - limited d/t back      Observation/Other  Assessments   Focus on Therapeutic Outcomes (FOTO)   40% limited      Posture/Postural Control   Posture/Postural Control  Postural limitations    Postural Limitations  Flexed trunk;Rounded Shoulders;Forward head;Decreased lumbar lordosis   extra abdominal girth     ROM / Strength   AROM / PROM / Strength  AROM;Strength      AROM   AROM Assessment Site  Lumbar;Hip    Right/Left Hip  --   IR Rt 20, Lt 5, ER Rt 55, Lt 53   Lumbar Flexion  6" from floor     Lumbar Extension  50% limited - fear    Lumbar - Right Rotation  WNL    Lumbar - Left Rotation  WNL      Strength   Strength Assessment Site  Hip;Knee;Ankle;Lumbar    Right/Left Hip  Left   Rt WNL except abduction 4+/5   Left Hip Flexion  5/5    Left Hip Extension  4-/5    Left Hip ABduction  4/5    Right/Left Knee  --   WNL   Right/Left Ankle  --   WNL   Lumbar Flexion  --   TA poor   Lumbar Extension  --   multifidi poor     Flexibility   Soft Tissue Assessment /Muscle Length  yes    Hamstrings  supine SLR Rt 63, Lt 55    Quadriceps  prone knee flexion Rt 108, Lt 110      Palpation   Spinal mobility  lumbar CPA and bilat UPA WNL.     Palpation comment  tightness in bilat lumbar paraspinals however no tenderness      Special Tests    Special Tests  Lumbar    Lumbar Tests  Slump Test;Straight Leg Raise      Slump test   Findings  Negative    Side  --   bilat     Straight Leg Raise   Findings  Positive    Side   Left                Objective measurements completed  on examination: See above findings.      [facility] Adult PT Treatment/Exercise - 01/09/19 0001      Exercises   Exercises  Lumbar      Lumbar Exercises: Stretches   Passive Hamstring Stretch  Left;Right;30 seconds   seated, VC for form   Lower Trunk Rotation  1 rep;30 seconds   each side     Lumbar Exercises: Standing   Other Standing Lumbar Exercises  10 reps hip ext each side, VC to keep core engaged and uppper body upright       Lumbar Exercises: Supine   Bridge  Compliant;10 reps      Modalities   Modalities  Traction      Traction   Type of Traction  Lumbar    Min (lbs)  60    Max (lbs)  75    Hold Time  60    Rest Time  20    Time  12             PT Education - 01/09/19 0937    Education Details  HEP, traction and POC    Person(s) Educated  Patient    Methods  Explanation;Handout;Demonstration    Comprehension  Returned demonstration;Verbalized understanding;Verbal cues required          PT Long Term Goals - 01/09/19 0933      PT LONG TERM GOAL #1   Title  I with advanced HEP for core and hip flexibility ( 02/20/2019)    Time  6    Period  Weeks    Status  New    Target Date  02/20/19      PT LONG TERM GOAL #2   Title  report ability to play golf with minimal to no back pain ( 02/20/2019)    Time  6    Period  Weeks    Status  New    Target Date  02/20/19      PT LONG TERM GOAL #3   Title  improve FOTO =/< 35% limited ( 02/20/2019)    Time  6    Period  Weeks    Status  New    Target Date  02/20/19      PT LONG TERM GOAL #4   Title  demo strong contraction of lumbar multifidi for spinal stability ( 02/20/2019)    Time  6    Period  Weeks    Status  New    Target Date  02/20/19      PT LONG TERM GOAL #5   Title  increase bilat hip flexibility to WNL to release pressure on the back ( 02/20/2019)    Time  6    Period  Weeks    Status  New    Target Date  02/20/19             Plan - 01/09/19 0840    Clinical Impression Statement  78 yo male with repeat onsent of Lt sided buttock pain and LE numbness.  He is overweight, has significant core weakness and tightness through bilat hips that place extra stress on the low back.  He is referred to PT for a trial of lumbar traction and core strengthening.  He would also benefit from hip flexibilty.  He has muscular tightness in the low back however no pain with palpation.  Pt did have cancer 2 yrs ago, was recently moved  to 1 yr follow ups on this.    Personal  Factors and Comorbidities  Age;Comorbidity 3+    Examination-Activity Limitations  Other    Examination-Participation Restrictions  Other    Stability/Clinical Decision Making  Stable/Uncomplicated    Clinical Decision Making  Low    Rehab Potential  Good    PT Frequency  2x / week    PT Duration  6 weeks    PT Treatment/Interventions  Patient/family education;Functional mobility training;Moist Heat;Traction;Ultrasound;Therapeutic exercise;Spinal Manipulations;Dry needling;Cryotherapy;Manual techniques    PT Next Visit Plan  assess response to lumbar traction, increase pull if did well.  Hip rotation ROM and core stability ex.    Consulted and Agree with Plan of Care  Patient       Patient will benefit from skilled therapeutic intervention in order to improve the following deficits and impairments:  Decreased range of motion, Obesity, Increased muscle spasms, Pain, Decreased strength  Visit Diagnosis: 1. Chronic left-sided low back pain with left-sided sciatica   2. Muscle weakness (generalized)        Problem List Patient Active Problem List   Diagnosis Date Noted  . Unilateral primary osteoarthritis, right knee 08/29/2017  . Type 2 diabetes mellitus with diabetic neuropathy, unspecified ([place]) 03/10/2017  . Neuropathy, diabetic ([place]) 03/10/2017  . Malignant neoplasm of prostate ([place]) 07/07/2016  . OBESITY 01/08/2009  . EDEMA 01/08/2009  . ERECTILE DYSFUNCTION 04/24/2008  . BPH with urinary obstruction 04/24/2008  . Hyperlipidemia 04/13/2007  . Essential hypertension 04/13/2007  . Coronary atherosclerosis 04/13/2007    Jeral Pinch PT  01/09/2019, 9:38 AM  [facility] [address] [place], [place], [address] Phone: [phone]   Fax:  [phone]  Name: MELBERT BOTELHO MRN: [id number] Date of Birth: 08-11-1940

## 2019-01-15 ENCOUNTER — Other Ambulatory Visit: Payer: Self-pay

## 2019-01-15 ENCOUNTER — Ambulatory Visit: Payer: PPO | Admitting: Physical Therapy

## 2019-01-15 ENCOUNTER — Encounter: Payer: Self-pay | Admitting: Physical Therapy

## 2019-01-15 DIAGNOSIS — M5442 Lumbago with sciatica, left side: Secondary | ICD-10-CM

## 2019-01-15 DIAGNOSIS — M6281 Muscle weakness (generalized): Secondary | ICD-10-CM

## 2019-01-15 DIAGNOSIS — G8929 Other chronic pain: Secondary | ICD-10-CM

## 2019-01-15 NOTE — Therapy (Signed)
[place], [place], [address] Phone: [phone]   Fax:  [phone]  Physical Therapy Treatment  Patient Details  Name: Robert [name] MRN: [id number] Date of Birth: February 23, 1941 Referring Provider (PT): Dr Jean Rosenthal   Encounter Date: 01/15/2019  PT End of Session - 01/15/19 1001    Visit Number  2    Number of Visits  12    Date for PT Re-Evaluation  02/20/19    Authorization Type  MCR - pnote at 10th visit    PT Start Time  1002    PT Stop Time  1100    PT Time Calculation (min)  58 min    Activity Tolerance  Patient tolerated treatment well    Behavior During Therapy  [facility] for tasks assessed/performed       Past Medical History:  Diagnosis Date  . CAD (coronary artery disease)    per pt cardiologist dr Johnsie Cancel, last office visit 02-05-2010, currently followed by pcp , dr fry  . Diverticulosis of colon   . ED (erectile dysfunction) of non-organic origin   . History of colon polyps    02/ 2009 benign  . Hyperlipidemia   . Hypertension   . Nodular prostate with lower urinary tract symptoms   . Prostate cancer [facility]) urologist-  dr wrenn/  oncologist-  dr Tammi Klippel   dx 06-15-2016,  Stage T2a, Gleason 3+3,  PSA 4.64,  vol 23.4cc  . S/P CABG x 4 05/02/2005   LIMA to LAD,  radial graft to CFx marginal,  SVG to diagonal and RCA  . Wears glasses     Past Surgical History:  Procedure Laterality Date  . CARDIAC CATHETERIZATION  04-29-2005  dr w. Albertine Patricia   severe 2V CAD-- 90% LAD diagonal, 80% CFx ostium,  50% pRCA  . COLONOSCOPY  08/03/2007   per Dr. Fuller Plan, benign polyp   . CORONARY ARTERY BYPASS GRAFT  05-02-2005  dr Lucianne Lei tright   LIMA to LAD,  radial graft to CFx marginal,  SVG to diagonal and RCA  . EXICIOSN MASS INDEX FINGER Right 07/2015   per pt benign  . PROSTATE BIOPSY  06/15/2016  . RADIOACTIVE SEED IMPLANT [name] 10/20/2016   Procedure: RADIOACTIVE SEED IMPLANT/BRACHYTHERAPY  IMPLANT, 71 seeds implanted, no seeds found in bladder;  Surgeon: Irine Seal, MD;  Location: [facility];  Service: Urology;  Laterality: [name];    There were no vitals filed for this visit.  Subjective Assessment - 01/15/19 1006    Subjective  I do not have  much pain in my left buttock right now 3/10 but I have had it this week. Mostly in the morning, first thing.  I dont do stretching in the mornning but I will start    Diagnostic tests  x-ray - DDD, MRI if PT doesn't help    Patient Stated Goals  walk without pain, make sure he doesnt fall ( has not yet)    Currently in Pain?  Yes    Pain Score  3     Pain Location  Buttocks    Pain Orientation  Left    Pain Descriptors / Indicators  Nagging    Pain Onset  More than a month ago    Aggravating Factors   first thing in the morning.                       [facility] Adult PT Treatment/Exercise - 01/15/19 1010  Self-Care   Self-Care  Other Self-Care Comments;Posture    Posture  discussion of proper posture with lifting and proper hip hinging with use of dowel and wall external cue,       Exercises   Exercises  Lumbar      Lumbar Exercises: Stretches   Passive Hamstring Stretch  Left;Right;30 seconds   seated, VC for form   Passive Hamstring Stretch Limitations  done after deadlifting , much looser    Lower Trunk Rotation  30 seconds;2 reps   each side     Lumbar Exercises: Aerobic   Nustep  level 5 7 minutes      Lumbar Exercises: Standing   Other Standing Lumbar Exercises  standing at sink mini lunge forward, side and back x 10 with UE suppport cues for chest up.     Other Standing Lumbar Exercises  deadlifting with 15# on raised stool, with cues for proper form, 10 x 3 with one minute rest between.  also with green t band deadlift with resistance green t band 2 x 10      Lumbar Exercises: Supine   Bridge  Compliant;10 reps      Knee/Hip Exercises: Aerobic   Nustep  Nustep L5 7 minutes       Knee/Hip Exercises: Standing   Other Standing Knee Exercises  DL with 15 pounds on stool 10 x 2 with cues.  showing how to  hip  hinge with wall cue and dowel cue    Other Standing Knee Exercises  standing with mini lunges forward, to the side and backward, cues for upright chest. , sink squat 3 x 10 with chair behind for safety.  able to perform without exacerbating knee pain.      Knee/Hip Exercises: Seated   Other Seated Knee/Hip Exercises  standing       Modalities   Modalities  Traction      Traction   Type of Traction  Lumbar    Min (lbs)  70    Max (lbs)  90    Rest Time  20    Time  12             PT Education - 01/15/19 1038    Education Details  Education on proper hip hinge an deadlift with raised surface to be simulated with laundry basket/milk carton. added to HEP    Person(s) Educated  Patient    Methods  Explanation;Demonstration;Tactile cues;Verbal cues;Handout    Comprehension  Verbalized understanding;Returned demonstration          PT Long Term Goals - 01/09/19 0933      PT LONG TERM GOAL #1   Title  I with advanced HEP for core and hip flexibility ( 02/20/2019)    Time  6    Period  Weeks    Status  New    Target Date  02/20/19      PT LONG TERM GOAL #2   Title  report ability to play golf with minimal to no back pain ( 02/20/2019)    Time  6    Period  Weeks    Status  New    Target Date  02/20/19      PT LONG TERM GOAL #3   Title  improve FOTO =/< 35% limited ( 02/20/2019)    Time  6    Period  Weeks    Status  New    Target Date  02/20/19      PT LONG  TERM GOAL #4   Title  demo strong contraction of lumbar multifidi for spinal stability ( 02/20/2019)    Time  6    Period  Weeks    Status  New    Target Date  02/20/19      PT LONG TERM GOAL #5   Title  increase bilat hip flexibility to WNL to release pressure on the back ( 02/20/2019)    Time  6    Period  Weeks    Status  New    Target Date  02/20/19            Plan -  01/15/19 1056    Clinical Impression Statement  Pt enters clinic with 3/10 pain in left buttock but improves with movement and hip hinging and deadlifting.15 pounds.  Pt reports he never stretches/moves in the morning and PT advised him to move and stretch in the morning.  Pt needs moderate cuing for proper form. Pt reported he likes the lumbar traction.and feels it benefits him.  Pt was able to tolerate 90 lb lumbar traction with good result.  Pt was feeling decreased pain with movement but di want to continue with lumbar traction.    Personal Factors and Comorbidities  Age;Comorbidity 3+    Examination-Activity Limitations  Other    Examination-Participation Restrictions  Other    Rehab Potential  Good    PT Frequency  2x / week    PT Duration  6 weeks    PT Treatment/Interventions  Patient/family education;Functional mobility training;Moist Heat;Traction;Ultrasound;Therapeutic exercise;Spinal Manipulations;Dry needling;Cryotherapy;Manual techniques    PT Next Visit Plan  Review HEP and add  Hip rotation ROM/stretching and core stability ex.    Consulted and Agree with Plan of Care  Patient       Patient will benefit from skilled therapeutic intervention in order to improve the following deficits and impairments:  Decreased range of motion, Obesity, Increased muscle spasms, Pain, Decreased strength  Visit Diagnosis: 1. Chronic left-sided low back pain with left-sided sciatica   2. Muscle weakness (generalized)        Problem List Patient Active Problem List   Diagnosis Date Noted  . Unilateral primary osteoarthritis, right knee 08/29/2017  . Type 2 diabetes mellitus with diabetic neuropathy, unspecified ([place]) 03/10/2017  . Neuropathy, diabetic ([place]) 03/10/2017  . Malignant neoplasm of prostate ([place]) 07/07/2016  . OBESITY 01/08/2009  . EDEMA 01/08/2009  . ERECTILE DYSFUNCTION 04/24/2008  . BPH with urinary obstruction 04/24/2008  . Hyperlipidemia 04/13/2007  . Essential  hypertension 04/13/2007  . Coronary atherosclerosis 04/13/2007   Voncille Lo, PT Certified Exercise Expert for the Aging Adult  01/15/19 12:16 PM Phone: [phone] Fax: [facility] [address] [place], [place], [address] Phone: [phone]   Fax:  [phone]  Name: Robert [name] MRN: [id number] Date of Birth: [birth year]

## 2019-01-15 NOTE — Patient Instructions (Signed)
     Robert [name], PT Certified Exercise Expert for the Aging Adult  01/15/19 10:38 AM Phone: [phone] Fax: [phone]

## 2019-01-17 ENCOUNTER — Other Ambulatory Visit: Payer: Self-pay

## 2019-01-17 ENCOUNTER — Ambulatory Visit: Payer: PPO | Admitting: Physical Therapy

## 2019-01-17 ENCOUNTER — Encounter: Payer: Self-pay | Admitting: Physical Therapy

## 2019-01-17 DIAGNOSIS — M6281 Muscle weakness (generalized): Secondary | ICD-10-CM

## 2019-01-17 DIAGNOSIS — G8929 Other chronic pain: Secondary | ICD-10-CM

## 2019-01-17 DIAGNOSIS — M5442 Lumbago with sciatica, left side: Secondary | ICD-10-CM

## 2019-01-17 NOTE — Therapy (Signed)
[place], [place], [address] Phone: [phone]   Fax:  [phone]  Physical Therapy Treatment  Patient Details  Name: Robert [name] MRN: [id number] Date of Birth: 09/06/1940 Referring Provider (PT): Dr Jean Rosenthal   Encounter Date: 01/17/2019  PT End of Session - 01/17/19 1951    Visit Number  3    Number of Visits  12    Date for PT Re-Evaluation  02/20/19    Authorization Type  MCR - pnote at 10th visit    PT Start Time  1714    PT Stop Time  1812   8 min spent assisting another patient and for cleaning traction table prior to use not included in timed minutes   PT Time Calculation (min)  58 min    Activity Tolerance  Patient tolerated treatment well    Behavior During Therapy  [facility] for tasks assessed/performed       Past Medical History:  Diagnosis Date  . CAD (coronary artery disease)    per pt cardiologist dr Johnsie Cancel, last office visit 02-05-2010, currently followed by pcp , dr fry  . Diverticulosis of colon   . ED (erectile dysfunction) of non-organic origin   . History of colon polyps    02/ 2009 benign  . Hyperlipidemia   . Hypertension   . Nodular prostate with lower urinary tract symptoms   . Prostate cancer [facility]) urologist-  dr wrenn/  oncologist-  dr Tammi Klippel   dx 06-15-2016,  Stage T2a, Gleason 3+3,  PSA 4.64,  vol 23.4cc  . S/P CABG x 4 05/02/2005   LIMA to LAD,  radial graft to CFx marginal,  SVG to diagonal and RCA  . Wears glasses     Past Surgical History:  Procedure Laterality Date  . CARDIAC CATHETERIZATION  04-29-2005  dr w. Albertine Patricia   severe 2V CAD-- 90% LAD diagonal, 80% CFx ostium,  50% pRCA  . COLONOSCOPY  08/03/2007   per Dr. Fuller Plan, benign polyp   . CORONARY ARTERY BYPASS GRAFT  05-02-2005  dr Lucianne Lei tright   LIMA to LAD,  radial graft to CFx marginal,  SVG to diagonal and RCA  . EXICIOSN MASS INDEX FINGER Right 07/2015   per pt benign  . PROSTATE BIOPSY   06/15/2016  . RADIOACTIVE SEED IMPLANT [name] 10/20/2016   Procedure: RADIOACTIVE SEED IMPLANT/BRACHYTHERAPY IMPLANT, 71 seeds implanted, no seeds found in bladder;  Surgeon: Irine Seal, MD;  Location: [facility];  Service: Urology;  Laterality: [name];    There were no vitals filed for this visit.  Subjective Assessment - 01/17/19 1718    Subjective  Continues to have primary pain in AM which eases with movement during the day. Pain radiates from left lumbar and buttock region distally to dorsal aspect of left foot.                       [facility] Adult PT Treatment/Exercise - 01/17/19 0001      Lumbar Exercises: Stretches   Single Knee to Chest Stretch  Right;Left;5 reps;10 seconds      Lumbar Exercises: Aerobic   Nustep  L5 x 6 min      Lumbar Exercises: Standing   Other Standing Lumbar Exercises  hip hinge with "3 point contact" x 15 reps mod cues for form to avoid rounding at lumbar spine and to reach back with hips    Other Standing Lumbar Exercises  deadlift witih  green Theraband 2x10      Lumbar Exercises: Seated   Other Seated Lumbar Exercises  trunk flexion x 15 reps      Lumbar Exercises: Supine   Pelvic Tilt  20 reps    Clam  20 reps    Clam Limitations  Green band    Bent Knee Raise  15 reps    Bridge  20 reps      Traction   Type of Traction  Lumbar    Min (lbs)  80    Max (lbs)  100    Hold Time  60    Rest Time  20    Time  12      Manual Therapy   Manual Therapy  Joint mobilization    Joint Mobilization  Long axis distraction left hip grade III-IV oscillations             PT Education - 01/17/19 1950    Education Details  exercises, form with hip hinges    Person(s) Educated  Patient    Methods  Explanation;Demonstration;Tactile cues;Verbal cues    Comprehension  Need further instruction;Verbalized understanding;Returned demonstration;Verbal cues required;Tactile cues required          PT Long Term Goals - 01/09/19  0933      PT LONG TERM GOAL #1   Title  I with advanced HEP for core and hip flexibility ( 02/20/2019)    Time  6    Period  Weeks    Status  New    Target Date  02/20/19      PT LONG TERM GOAL #2   Title  report ability to play golf with minimal to no back pain ( 02/20/2019)    Time  6    Period  Weeks    Status  New    Target Date  02/20/19      PT LONG TERM GOAL #3   Title  improve FOTO =/< 35% limited ( 02/20/2019)    Time  6    Period  Weeks    Status  New    Target Date  02/20/19      PT LONG TERM GOAL #4   Title  demo strong contraction of lumbar multifidi for spinal stability ( 02/20/2019)    Time  6    Period  Weeks    Status  New    Target Date  02/20/19      PT LONG TERM GOAL #5   Title  increase bilat hip flexibility to WNL to release pressure on the back ( 02/20/2019)    Time  6    Period  Weeks    Status  New    Target Date  02/20/19            Plan - 01/17/19 1951    Clinical Impression Statement  Suspect AM pain associated with inflammation along with degenerative changes. Notes continued benefit with traction which was progressed as noted per flowsheet with good tolerance. Pt. had difficulty with form with hip hinge requiring mod cues to correct. Good response flexion bias ROM with supine and seated exercises.    Personal Factors and Comorbidities  Age;Comorbidity 3+    Examination-Activity Limitations  Other    Examination-Participation Restrictions  Other    Stability/Clinical Decision Making  Stable/Uncomplicated    Clinical Decision Making  Low    Rehab Potential  Good    PT Frequency  2x / week    PT Duration  6 weeks    PT Treatment/Interventions  Patient/family education;Functional mobility training;Moist Heat;Traction;Ultrasound;Therapeutic exercise;Spinal Manipulations;Dry needling;Cryotherapy;Manual techniques    PT Next Visit Plan  Continue traction as found benefical, continue ROM/stretching/core stability and functional activities as  tolerated.    Consulted and Agree with Plan of Care  Patient       Patient will benefit from skilled therapeutic intervention in order to improve the following deficits and impairments:  Decreased range of motion, Obesity, Increased muscle spasms, Pain, Decreased strength  Visit Diagnosis: 1. Chronic left-sided low back pain with left-sided sciatica   2. Muscle weakness (generalized)        Problem List Patient Active Problem List   Diagnosis Date Noted  . Unilateral primary osteoarthritis, right knee 08/29/2017  . Type 2 diabetes mellitus with diabetic neuropathy, unspecified ([place]) 03/10/2017  . Neuropathy, diabetic ([place]) 03/10/2017  . Malignant neoplasm of prostate ([place]) 07/07/2016  . OBESITY 01/08/2009  . EDEMA 01/08/2009  . ERECTILE DYSFUNCTION 04/24/2008  . BPH with urinary obstruction 04/24/2008  . Hyperlipidemia 04/13/2007  . Essential hypertension 04/13/2007  . Coronary atherosclerosis 04/13/2007    Beaulah Dinning, PT, DPT 01/17/19 7:56 PM  [facility] [address] [place], [place], [address] Phone: [phone]   Fax:  [phone]  Name: Robert [name] MRN: [id number] Date of Birth: August 31, 1940

## 2019-01-21 ENCOUNTER — Other Ambulatory Visit: Payer: Self-pay

## 2019-01-21 ENCOUNTER — Ambulatory Visit: Payer: PPO | Admitting: Physical Therapy

## 2019-01-21 ENCOUNTER — Encounter: Payer: Self-pay | Admitting: Physical Therapy

## 2019-01-21 DIAGNOSIS — M6281 Muscle weakness (generalized): Secondary | ICD-10-CM

## 2019-01-21 DIAGNOSIS — G8929 Other chronic pain: Secondary | ICD-10-CM

## 2019-01-21 DIAGNOSIS — M5442 Lumbago with sciatica, left side: Secondary | ICD-10-CM | POA: Diagnosis not present

## 2019-01-21 NOTE — Therapy (Signed)
[place], [place], [address] Phone: [phone]   Fax:  [phone]  Physical Therapy Treatment  Patient Details  Name: Robert [name] MRN: [id number] Date of Birth: [birth year] Referring Provider (PT): Dr Jean Rosenthal   Encounter Date: 01/21/2019  PT End of Session - 01/21/19 0756    Visit Number  4    Number of Visits  12    Date for PT Re-Evaluation  02/20/19    Authorization Type  MCR - pnote at 10th visit    PT Start Time  0756    PT Stop Time  0902    PT Time Calculation (min)  66 min    Activity Tolerance  Patient tolerated treatment well    Behavior During Therapy  [facility] for tasks assessed/performed       Past Medical History:  Diagnosis Date  . CAD (coronary artery disease)    per pt cardiologist dr Johnsie Cancel, last office visit 02-05-2010, currently followed by pcp , dr fry  . Diverticulosis of colon   . ED (erectile dysfunction) of non-organic origin   . History of colon polyps    02/ 2009 benign  . Hyperlipidemia   . Hypertension   . Nodular prostate with lower urinary tract symptoms   . Prostate cancer [facility]) urologist-  dr wrenn/  oncologist-  dr Tammi Klippel   dx 06-15-2016,  Stage T2a, Gleason 3+3,  PSA 4.64,  vol 23.4cc  . S/P CABG x 4 05/02/2005   LIMA to LAD,  radial graft to CFx marginal,  SVG to diagonal and RCA  . Wears glasses     Past Surgical History:  Procedure Laterality Date  . CARDIAC CATHETERIZATION  04-29-2005  dr w. Albertine Patricia   severe 2V CAD-- 90% LAD diagonal, 80% CFx ostium,  50% pRCA  . COLONOSCOPY  08/03/2007   per Dr. Fuller Plan, benign polyp   . CORONARY ARTERY BYPASS GRAFT  05-02-2005  dr Lucianne Lei tright   LIMA to LAD,  radial graft to CFx marginal,  SVG to diagonal and RCA  . EXICIOSN MASS INDEX FINGER Right 07/2015   per pt benign  . PROSTATE BIOPSY  06/15/2016  . RADIOACTIVE SEED IMPLANT [name] 10/20/2016   Procedure: RADIOACTIVE SEED IMPLANT/BRACHYTHERAPY  IMPLANT, 71 seeds implanted, no seeds found in bladder;  Surgeon: Irine Seal, MD;  Location: [facility];  Service: Urology;  Laterality: [name];    There were no vitals filed for this visit.  Subjective Assessment - 01/21/19 0757    Subjective  Pt reports that the stretching on his leg Thursday felt great, did his HEP Friday and then Sat he could hardly walk. He hasn't had it happen again    Patient Stated Goals  walk without pain, make sure he doesnt fall ( has not yet)    Currently in Pain?  Yes    Pain Score  3     Pain Location  Buttocks    Pain Orientation  Left    Pain Descriptors / Indicators  Tightness;Pressure    Pain Type  Chronic pain    Pain Radiating Towards  into left calf    Pain Onset  More than a month ago    Pain Frequency  Constant    Aggravating Factors   in AM    Pain Relieving Factors  moving around doing HEP         [facility] PT Assessment - 01/21/19 0001      Assessment  Medical Diagnosis  LBP with Lt LE sciatica    Referring Provider (PT)  Dr Jean Rosenthal      AROM   Right/Left Hip  --   Rt IR/ER 32/55, Lt IR/ER 22/55   Lumbar Flexion  5" from the floor      Strength   Left Hip Extension  4+/5    Left Hip ABduction  4+/5      Flexibility   Hamstrings  Rt 70, Lt 72                   [facility] Adult PT Treatment/Exercise - 01/21/19 0001      Exercises   Exercises  Lumbar      Lumbar Exercises: Stretches   Active Hamstring Stretch  Left;Right;20 seconds    Single Knee to Chest Stretch  Left;Right;20 seconds    Other Lumbar Stretch Exercise  LE IR/ER windshield wipers in hooklying      Lumbar Exercises: Aerobic   Nustep  L5 x 6 min      Lumbar Exercises: Supine   Bridge  20 reps   full lift -half down-full-all the way down.   Isometric Hip Flexion  15 reps;5 seconds   alternating sides     Traction   Type of Traction  Lumbar    Min (lbs)  90    Max (lbs)  110    Hold Time  60    Rest Time  20    Time   15      Manual Therapy   Manual Therapy  Passive ROM;Joint mobilization    Joint Mobilization  Long axis distraction left hip grade III-IV oscillations    Passive ROM  bilat hip stretches into IR/ER 3x45 sec holds                  PT Long Term Goals - 01/21/19 0818      PT LONG TERM GOAL #1   Title  I with advanced HEP for core and hip flexibility ( 02/20/2019)    Status  On-going      PT LONG TERM GOAL #2   Title  report ability to play golf with minimal to no back pain ( 02/20/2019)    Baseline  has not attempted yet    Status  On-going      PT LONG TERM GOAL #3   Title  improve FOTO =/< 35% limited ( 02/20/2019)    Status  On-going      PT LONG TERM GOAL #4   Title  demo strong contraction of lumbar multifidi for spinal stability ( 02/20/2019)    Status  On-going      PT LONG TERM GOAL #5   Title  increase bilat hip flexibility to WNL to release pressure on the back ( 02/20/2019)    Baseline  some improvement in hamstrings, not WNL yet , Rt hip rotation WNL, Lt partially still tight with IR    Status  Partially Met            Plan - 01/21/19 0819    Clinical Impression Statement  Ed feels like therapy is helping him.  Tolerates lumbar traction well, it up to his max pull.Marland Kitchen  He also reports he feels more steady when walking.  His left hip has improved strength and he is making progress to his goals.  He continues with some symptoms into the LT LE as well as core and hip weakness and stiffness.  Progress  well with therapy.    Rehab Potential  Good    PT Frequency  2x / week    PT Duration  6 weeks    PT Treatment/Interventions  Patient/family education;Functional mobility training;Moist Heat;Traction;Ultrasound;Therapeutic exercise;Spinal Manipulations;Dry needling;Cryotherapy;Manual techniques    PT Next Visit Plan  continue with lumbar traction and progress HEP    Consulted and Agree with Plan of Care  Patient       Patient will benefit from skilled  therapeutic intervention in order to improve the following deficits and impairments:  Decreased range of motion, Obesity, Increased muscle spasms, Pain, Decreased strength  Visit Diagnosis: 1. Chronic left-sided low back pain with left-sided sciatica   2. Muscle weakness (generalized)        Problem List Patient Active Problem List   Diagnosis Date Noted  . Unilateral primary osteoarthritis, right knee 08/29/2017  . Type 2 diabetes mellitus with diabetic neuropathy, unspecified ([place]) 03/10/2017  . Neuropathy, diabetic ([place]) 03/10/2017  . Malignant neoplasm of prostate ([place]) 07/07/2016  . OBESITY 01/08/2009  . EDEMA 01/08/2009  . ERECTILE DYSFUNCTION 04/24/2008  . BPH with urinary obstruction 04/24/2008  . Hyperlipidemia 04/13/2007  . Essential hypertension 04/13/2007  . Coronary atherosclerosis 04/13/2007    Jeral Pinch PT  01/21/2019, 8:44 AM  [facility] [address] [place], [place], [address] Phone: [phone]   Fax:  [phone]  Name: Robert [name] MRN: [id number] Date of Birth: 1941-01-31

## 2019-01-24 ENCOUNTER — Other Ambulatory Visit: Payer: Self-pay

## 2019-01-24 ENCOUNTER — Ambulatory Visit: Payer: PPO | Admitting: Physical Therapy

## 2019-01-24 DIAGNOSIS — M5442 Lumbago with sciatica, left side: Secondary | ICD-10-CM

## 2019-01-24 DIAGNOSIS — M6281 Muscle weakness (generalized): Secondary | ICD-10-CM

## 2019-01-24 DIAGNOSIS — G8929 Other chronic pain: Secondary | ICD-10-CM

## 2019-01-24 NOTE — Therapy (Signed)
[place], [place], [address] Phone: [phone]   Fax:  [phone]  Physical Therapy Treatment  Patient Details  Name: Robert [name] MRN: [id number] Date of Birth: 1940-11-01 Referring Provider (PT): Dr Jean Rosenthal   Encounter Date: 01/24/2019  PT End of Session - 01/24/19 0831    Visit Number  5    Number of Visits  12    Date for PT Re-Evaluation  02/20/19    Authorization Type  MCR - pnote at 10th visit    PT Start Time  0750    PT Stop Time  0850    PT Time Calculation (min)  60 min    Activity Tolerance  Patient tolerated treatment well    Behavior During Therapy  [facility] for tasks assessed/performed       Past Medical History:  Diagnosis Date  . CAD (coronary artery disease)    per pt cardiologist dr Johnsie Cancel, last office visit 02-05-2010, currently followed by pcp , dr fry  . Diverticulosis of colon   . ED (erectile dysfunction) of non-organic origin   . History of colon polyps    02/ 2009 benign  . Hyperlipidemia   . Hypertension   . Nodular prostate with lower urinary tract symptoms   . Prostate cancer [facility]) urologist-  dr wrenn/  oncologist-  dr Tammi Klippel   dx 06-15-2016,  Stage T2a, Gleason 3+3,  PSA 4.64,  vol 23.4cc  . S/P CABG x 4 05/02/2005   LIMA to LAD,  radial graft to CFx marginal,  SVG to diagonal and RCA  . Wears glasses     Past Surgical History:  Procedure Laterality Date  . CARDIAC CATHETERIZATION  04-29-2005  dr w. Albertine Patricia   severe 2V CAD-- 90% LAD diagonal, 80% CFx ostium,  50% pRCA  . COLONOSCOPY  08/03/2007   per Dr. Fuller Plan, benign polyp   . CORONARY ARTERY BYPASS GRAFT  05-02-2005  dr Lucianne Lei tright   LIMA to LAD,  radial graft to CFx marginal,  SVG to diagonal and RCA  . EXICIOSN MASS INDEX FINGER Right 07/2015   per pt benign  . PROSTATE BIOPSY  06/15/2016  . RADIOACTIVE SEED IMPLANT [name] 10/20/2016   Procedure: RADIOACTIVE SEED IMPLANT/BRACHYTHERAPY  IMPLANT, 71 seeds implanted, no seeds found in bladder;  Surgeon: Irine Seal, MD;  Location: [facility];  Service: Urology;  Laterality: [name];    There were no vitals filed for this visit.  Subjective Assessment - 01/24/19 0818    Subjective  Reports he is is improving with PT, back feels better but still having some Lt radiculopathy into calf. he does not rate intensity    Diagnostic tests  x-ray - DDD, MRI if PT doesn't help    Patient Stated Goals  walk without pain, make sure he doesnt fall ( has not yet)    Pain Onset  More than a month ago                       [facility] Adult PT Treatment/Exercise - 01/24/19 0001      Lumbar Exercises: Stretches   Active Hamstring Stretch  Left;Right;30 seconds    Single Knee to Chest Stretch  Left;Right;30 seconds      Lumbar Exercises: Aerobic   Nustep  L5 x 6 min      Lumbar Exercises: Machines for Strengthening   Other Lumbar Machine Exercise  row machine 20 lbs  X20 ea  wide grip and narrow grip, lat pull  x20 all with 20 lbs    Other Lumbar Machine Exercise  leg press 35 lbs X 20      Lumbar Exercises: Standing   Other Standing Lumbar Exercises  hip ext bilat X 15 ea side    Other Standing Lumbar Exercises  standing lumbar ext X 10      Traction   Type of Traction  Lumbar    Min (lbs)  90    Max (lbs)  110    Hold Time  60    Rest Time  20    Time  15      Manual Therapy   Manual Therapy  Passive ROM;Joint mobilization    Joint Mobilization  Long axis distraction left hip grade III-IV oscillations    Passive ROM  bilat hip stretches into IR/ER 3x45 sec holds                  PT Long Term Goals - 01/21/19 0818      PT LONG TERM GOAL #1   Title  I with advanced HEP for core and hip flexibility ( 02/20/2019)    Status  On-going      PT LONG TERM GOAL #2   Title  report ability to play golf with minimal to no back pain ( 02/20/2019)    Baseline  has not attempted yet    Status  On-going       PT LONG TERM GOAL #3   Title  improve FOTO =/< 35% limited ( 02/20/2019)    Status  On-going      PT LONG TERM GOAL #4   Title  demo strong contraction of lumbar multifidi for spinal stability ( 02/20/2019)    Status  On-going      PT LONG TERM GOAL #5   Title  increase bilat hip flexibility to WNL to release pressure on the back ( 02/20/2019)    Baseline  some improvement in hamstrings, not WNL yet , Rt hip rotation WNL, Lt partially still tight with IR    Status  Partially Met            Plan - 01/24/19 0832    Clinical Impression Statement  Had good tolerance with exercise progression and had less Lt leg radiculopathy after exercise, stretching and MT, traciton continued at the end of session as he feels this is making the biggest difference in his symptoms.    Rehab Potential  Good    PT Frequency  2x / week    PT Duration  6 weeks    PT Treatment/Interventions  Patient/family education;Functional mobility training;Moist Heat;Traction;Ultrasound;Therapeutic exercise;Spinal Manipulations;Dry needling;Cryotherapy;Manual techniques    PT Next Visit Plan  continue with lumbar traction and progress HEP    Consulted and Agree with Plan of Care  Patient       Patient will benefit from skilled therapeutic intervention in order to improve the following deficits and impairments:  Decreased range of motion, Obesity, Increased muscle spasms, Pain, Decreased strength  Visit Diagnosis: 1. Chronic left-sided low back pain with left-sided sciatica   2. Muscle weakness (generalized)        Problem List Patient Active Problem List   Diagnosis Date Noted  . Unilateral primary osteoarthritis, right knee 08/29/2017  . Type 2 diabetes mellitus with diabetic neuropathy, unspecified ([place]) 03/10/2017  . Neuropathy, diabetic ([place]) 03/10/2017  . Malignant neoplasm of prostate ([place]) 07/07/2016  . OBESITY 01/08/2009  .  EDEMA 01/08/2009  . ERECTILE DYSFUNCTION 04/24/2008  . BPH with  urinary obstruction 04/24/2008  . Hyperlipidemia 04/13/2007  . Essential hypertension 04/13/2007  . Coronary atherosclerosis 04/13/2007    Silvestre Mesi 01/24/2019, 8:47 AM  [facility] [address] [place], [place], [address] Phone: [phone]   Fax:  [phone]  Name: GIONNI VACA MRN: [id number] Date of Birth: [birth year]

## 2019-01-28 ENCOUNTER — Encounter: Payer: Self-pay | Admitting: Physical Therapy

## 2019-01-28 ENCOUNTER — Ambulatory Visit: Payer: PPO | Admitting: Physical Therapy

## 2019-01-28 ENCOUNTER — Other Ambulatory Visit: Payer: Self-pay

## 2019-01-28 DIAGNOSIS — M5442 Lumbago with sciatica, left side: Secondary | ICD-10-CM | POA: Diagnosis not present

## 2019-01-28 DIAGNOSIS — M6281 Muscle weakness (generalized): Secondary | ICD-10-CM

## 2019-01-28 DIAGNOSIS — G8929 Other chronic pain: Secondary | ICD-10-CM

## 2019-01-28 NOTE — Therapy (Addendum)
[place], [place], [address] Phone: [phone]   Fax:  [phone]  Physical Therapy Treatment/Discharge  Patient Details  Name: Robert [name] MRN: [id number] Date of Birth: 11-15-1940 Referring Provider (PT): Dr Jean Rosenthal   Encounter Date: 01/28/2019  PT End of Session - 01/28/19 0944    Visit Number  6    Number of Visits  12    Date for PT Re-Evaluation  02/20/19    Authorization Type  MCR - pnote at 10th visit    PT Start Time  0800    PT Stop Time  0855    PT Time Calculation (min)  55 min    Activity Tolerance  Patient tolerated treatment well    Behavior During Therapy  [facility] for tasks assessed/performed       Past Medical History:  Diagnosis Date  . CAD (coronary artery disease)    per pt cardiologist dr Johnsie Cancel, last office visit 02-05-2010, currently followed by pcp , dr fry  . Diverticulosis of colon   . ED (erectile dysfunction) of non-organic origin   . History of colon polyps    02/ 2009 benign  . Hyperlipidemia   . Hypertension   . Nodular prostate with lower urinary tract symptoms   . Prostate cancer [facility]) urologist-  dr wrenn/  oncologist-  dr Tammi Klippel   dx 06-15-2016,  Stage T2a, Gleason 3+3,  PSA 4.64,  vol 23.4cc  . S/P CABG x 4 05/02/2005   LIMA to LAD,  radial graft to CFx marginal,  SVG to diagonal and RCA  . Wears glasses     Past Surgical History:  Procedure Laterality Date  . CARDIAC CATHETERIZATION  04-29-2005  dr w. Albertine Patricia   severe 2V CAD-- 90% LAD diagonal, 80% CFx ostium,  50% pRCA  . COLONOSCOPY  08/03/2007   per Dr. Fuller Plan, benign polyp   . CORONARY ARTERY BYPASS GRAFT  05-02-2005  dr Lucianne Lei tright   LIMA to LAD,  radial graft to CFx marginal,  SVG to diagonal and RCA  . EXICIOSN MASS INDEX FINGER Right 07/2015   per pt benign  . PROSTATE BIOPSY  06/15/2016  . RADIOACTIVE SEED IMPLANT [name] 10/20/2016   Procedure: RADIOACTIVE SEED  IMPLANT/BRACHYTHERAPY IMPLANT, 71 seeds implanted, no seeds found in bladder;  Surgeon: Irine Seal, MD;  Location: [facility];  Service: Urology;  Laterality: [name];    There were no vitals filed for this visit.  Subjective Assessment - 01/28/19 0803    Subjective  Patient reports the pain comes and goes. He is having pain into his calf this morning. He can not think of anythingthat makes much of a difference. Sometimes he has pain and sometimes it is better.    Diagnostic tests  x-ray - DDD, MRI if PT doesn't help    Patient Stated Goals  walk without pain, make sure he doesnt fall ( has not yet)    Currently in Pain?  Yes    Pain Score  3     Pain Orientation  Left    Pain Descriptors / Indicators  Tightness    Pain Onset  More than a month ago    Pain Frequency  Constant    Aggravating Factors   mor pain in the morning    Pain Relieving Factors  Moving around                       [facility]  Adult PT Treatment/Exercise - 01/28/19 0001      Lumbar Exercises: Stretches   Active Hamstring Stretch  Left;Right;30 seconds    Single Knee to Chest Stretch  Left;Right;30 seconds      Lumbar Exercises: Aerobic   Nustep  L5 x 6 min      Lumbar Exercises: Machines for Strengthening   Other Lumbar Machine Exercise  row machine 20 lbs  X20 ea wide grip and narrow grip, lat pull  x20 all with 20 lbs    Other Lumbar Machine Exercise  leg press 35 lbs X 20      Lumbar Exercises: Supine   Bridge  10 reps      Traction   Type of Traction  Lumbar    Min (lbs)  90    Max (lbs)  110    Hold Time  60    Rest Time  20    Time  15      Manual Therapy   Joint Mobilization  Long axis distraction left hip grade III-IV oscillations    Passive ROM  bilat hip stretches into IR/ER 3x45 sec holds             PT Education - 01/28/19 0805    Education Details  reviewed HEP and symptom management    Person(s) Educated  Patient    Methods   Explanation;Demonstration;Tactile cues    Comprehension  Verbalized understanding;Returned demonstration;Verbal cues required;Tactile cues required          PT Long Term Goals - 01/21/19 0818      PT LONG TERM GOAL #1   Title  I with advanced HEP for core and hip flexibility ( 02/20/2019)    Status  On-going      PT LONG TERM GOAL #2   Title  report ability to play golf with minimal to no back pain ( 02/20/2019)    Baseline  has not attempted yet    Status  On-going      PT LONG TERM GOAL #3   Title  improve FOTO =/< 35% limited ( 02/20/2019)    Status  On-going      PT LONG TERM GOAL #4   Title  demo strong contraction of lumbar multifidi for spinal stability ( 02/20/2019)    Status  On-going      PT LONG TERM GOAL #5   Title  increase bilat hip flexibility to WNL to release pressure on the back ( 02/20/2019)    Baseline  some improvement in hamstrings, not WNL yet , Rt hip rotation WNL, Lt partially still tight with IR    Status  Partially Met            Plan - 01/28/19 0944    Clinical Impression Statement  Patient had mild spasming in his lower back today. With soft titssue mobilization and LAD he reported decreased symptoms into his leg. He reports the problem has been carryover. Therapy reviewed stretching and core strengthening exercises to provide at home.    Personal Factors and Comorbidities  Age;Comorbidity 3+    Examination-Activity Limitations  Other    Stability/Clinical Decision Making  Stable/Uncomplicated    Clinical Decision Making  Low    Rehab Potential  Good    PT Frequency  2x / week    PT Duration  6 weeks    PT Treatment/Interventions  Patient/family education;Functional mobility training;Moist Heat;Traction;Ultrasound;Therapeutic exercise;Spinal Manipulations;Dry needling;Cryotherapy;Manual techniques    PT Next Visit Plan  continue with lumbar  traction and progress HEP. Asess plan. Potential D/C to HEP.    Consulted and Agree with Plan of Care   Patient       Patient will benefit from skilled therapeutic intervention in order to improve the following deficits and impairments:  Decreased range of motion, Obesity, Increased muscle spasms, Pain, Decreased strength  Visit Diagnosis: Chronic left-sided low back pain with left-sided sciatica  Muscle weakness (generalized)     Problem List Patient Active Problem List   Diagnosis Date Noted  . Unilateral primary osteoarthritis, right knee 08/29/2017  . Type 2 diabetes mellitus with diabetic neuropathy, unspecified ([place]) 03/10/2017  . Neuropathy, diabetic ([place]) 03/10/2017  . Malignant neoplasm of prostate ([place]) 07/07/2016  . OBESITY 01/08/2009  . EDEMA 01/08/2009  . ERECTILE DYSFUNCTION 04/24/2008  . BPH with urinary obstruction 04/24/2008  . Hyperlipidemia 04/13/2007  . Essential hypertension 04/13/2007  . Coronary atherosclerosis 04/13/2007    Carney Living PT DPT  01/28/2019, 9:56 AM  [facility] [address] [place], [place], [address] Phone: [phone]   Fax:  [phone]  Name: Robert [name] MRN: [id number] Date of Birth: 02/02/1941   PHYSICAL THERAPY DISCHARGE SUMMARY  Visits from Start of Care: 6  Current functional level related to goals / functional outcomes: unknown   Remaining deficits: unknown   Education / Equipment: HEP Plan:                                                    Patient goals were not met. Patient is being discharged due to not returning since the last visit.  ?????     Jeral Pinch, PT 04/15/19 11:25 AM

## 2019-01-29 ENCOUNTER — Encounter: Payer: Self-pay | Admitting: Family Medicine

## 2019-01-29 ENCOUNTER — Ambulatory Visit (INDEPENDENT_AMBULATORY_CARE_PROVIDER_SITE_OTHER): Payer: PPO | Admitting: Family Medicine

## 2019-01-29 VITALS — BP 128/90 | HR 92 | Temp 99.1°F

## 2019-01-29 DIAGNOSIS — S90812A Abrasion, left foot, initial encounter: Secondary | ICD-10-CM | POA: Diagnosis not present

## 2019-01-29 DIAGNOSIS — S80212A Abrasion, left knee, initial encounter: Secondary | ICD-10-CM | POA: Diagnosis not present

## 2019-01-29 NOTE — Progress Notes (Signed)
Chief Complaint  Patient presents with  . Knee Injury    left knee. happened today    History of Present Illness: HPI   78 year old male patient presents following fall when pushing care ( stopped in road).  he reports he slipped and dragged his left knee  And left dorsal foot along road/pavement.  Tore up shoe.  No twist. No internal knee pain.   Area cleared with soap and water.    On Aspirin 81 mg .  COVID 19 screen No recent travel or known exposure to [place] The patient denies respiratory symptoms of COVID 19 at this time.  The importance of social distancing was discussed today.   Review of Systems  Constitutional: Negative for chills and fever.  HENT: Negative for congestion and ear pain.   Eyes: Negative for pain and redness.  Respiratory: Negative for cough and shortness of breath.   Cardiovascular: Negative for chest pain, palpitations and leg swelling.  Gastrointestinal: Negative for abdominal pain, blood in stool, constipation, diarrhea, nausea and vomiting.  Genitourinary: Negative for dysuria.  Musculoskeletal: Negative for falls and myalgias.  Skin: Negative for rash.  Neurological: Negative for dizziness.  Psychiatric/Behavioral: Negative for depression. The patient is not nervous/anxious.       Past Medical History:  Diagnosis Date  . CAD (coronary artery disease)    per pt cardiologist dr Johnsie Cancel, last office visit 02-05-2010, currently followed by pcp , dr fry  . Diverticulosis of colon   . ED (erectile dysfunction) of non-organic origin   . History of colon polyps    02/ 2009 benign  . Hyperlipidemia   . Hypertension   . Nodular prostate with lower urinary tract symptoms   . Prostate cancer [facility]) urologist-  dr wrenn/  oncologist-  dr Tammi Klippel   dx 06-15-2016,  Stage T2a, Gleason 3+3,  PSA 4.64,  vol 23.4cc  . S/P CABG x 4 05/02/2005   LIMA to LAD,  radial graft to CFx marginal,  SVG to diagonal and RCA  . Wears glasses     reports that he  has never smoked. He has never used smokeless tobacco. He reports that he does not drink alcohol or use drugs.   Current Outpatient Medications:  .  aspirin 81 MG tablet, Take 81 mg by mouth daily.  , Disp: , Rfl:  .  atenolol-chlorthalidone (TENORETIC) 50-25 MG tablet, Take 1 tablet by mouth daily., Disp: 90 tablet, Rfl: 3 .  atorvastatin (LIPITOR) 40 MG tablet, Take 1 tablet (40 mg total) by mouth daily., Disp: 90 tablet, Rfl: 3 .  gabapentin (NEURONTIN) 100 MG capsule, Take 3 capsules (300 mg total) by mouth at bedtime., Disp: 60 capsule, Rfl: 1 .  losartan (COZAAR) 100 MG tablet, Take 1 tablet (100 mg total) by mouth daily., Disp: 90 tablet, Rfl: 3 .  potassium chloride (K-DUR) 10 MEQ tablet, Take 1 tablet (10 mEq total) by mouth daily., Disp: 90 tablet, Rfl: 3 .  tamsulosin (FLOMAX) 0.4 MG CAPS capsule, Take 0.4 mg by mouth., Disp: , Rfl:    Observations/Objective: Blood pressure 128/90, pulse 92, temperature 99.1 F (37.3 C), SpO2 94 %.  Physical Exam Constitutional:      Appearance: He is well-developed.  HENT:     Head: Normocephalic.     Right Ear: Hearing normal.     Left Ear: Hearing normal.     Nose: Nose normal.  Neck:     Thyroid: No thyroid mass or thyromegaly.  Vascular: No carotid bruit.     Trachea: Trachea normal.  Cardiovascular:     Rate and Rhythm: Normal rate and regular rhythm.     Pulses: Normal pulses.     Heart sounds: Heart sounds not distant. No murmur. No friction rub. No gallop.      Comments: No peripheral edema Pulmonary:     Effort: Pulmonary effort is normal. No respiratory distress.     Breath sounds: Normal breath sounds.  Musculoskeletal:     Left knee: He exhibits laceration. He exhibits normal range of motion and no effusion. No medial joint line, no lateral joint line, no MCL, no LCL and no patellar tendon tenderness noted.     Left ankle: Normal. No tenderness. No medial malleolus tenderness found.     Left foot: Normal range of  motion. No tenderness.  Skin:    General: Skin is warm and dry.     Findings: No rash.          Comments:  abrasion dorsal great toe and left anterior knee  Psychiatric:        Speech: Speech normal.        Behavior: Behavior normal.        Thought Content: Thought content normal.      Assessment and Plan   Abrasion, knee, left, initial encounter  No internal knee injury noted. NO indication for X-ray.  Cleaned in depth, bandaged.  Apply topical antibiotic, ice , elevated.  Call if redness spreading, fever or increasing knee pain.     Eliezer Lofts, MD

## 2019-01-29 NOTE — Patient Instructions (Addendum)
Apply topical antibiotic, ice , elevated. Keep area clean with soap and water. Call if redness spreading, fever or increasing knee pain. Hold aspirin x 1 day.

## 2019-01-29 NOTE — Assessment & Plan Note (Signed)
No internal knee injury noted. NO indication for X-ray.  Cleaned in depth, bandaged.  Apply topical antibiotic, ice , elevated.  Call if redness spreading, fever or increasing knee pain.

## 2019-01-31 ENCOUNTER — Ambulatory Visit: Payer: PPO | Admitting: Physical Therapy

## 2019-03-27 ENCOUNTER — Encounter: Payer: PPO | Admitting: Family Medicine

## 2019-04-03 ENCOUNTER — Other Ambulatory Visit: Payer: Self-pay

## 2019-04-03 ENCOUNTER — Encounter: Payer: Self-pay | Admitting: Family Medicine

## 2019-04-03 ENCOUNTER — Ambulatory Visit (INDEPENDENT_AMBULATORY_CARE_PROVIDER_SITE_OTHER): Payer: PPO | Admitting: Family Medicine

## 2019-04-03 VITALS — BP 130/68 | HR 58 | Temp 97.8°F | Ht 66.0 in | Wt 226.8 lb

## 2019-04-03 DIAGNOSIS — Z0000 Encounter for general adult medical examination without abnormal findings: Secondary | ICD-10-CM | POA: Diagnosis not present

## 2019-04-03 DIAGNOSIS — E1140 Type 2 diabetes mellitus with diabetic neuropathy, unspecified: Secondary | ICD-10-CM | POA: Diagnosis not present

## 2019-04-03 DIAGNOSIS — Z23 Encounter for immunization: Secondary | ICD-10-CM

## 2019-04-03 LAB — CBC WITH DIFFERENTIAL/PLATELET
Basophils Absolute: 0 10*3/uL (ref 0.0–0.1)
Basophils Relative: 0.4 % (ref 0.0–3.0)
Eosinophils Absolute: 0.2 10*3/uL (ref 0.0–0.7)
Eosinophils Relative: 3.3 % (ref 0.0–5.0)
HCT: 48.4 % (ref 39.0–52.0)
Hemoglobin: 16.2 g/dL (ref 13.0–17.0)
Lymphocytes Relative: 16.5 % (ref 12.0–46.0)
Lymphs Abs: 1.2 10*3/uL (ref 0.7–4.0)
MCHC: 33.5 g/dL (ref 30.0–36.0)
MCV: 96.5 fl (ref 78.0–100.0)
Monocytes Absolute: 0.5 10*3/uL (ref 0.1–1.0)
Monocytes Relative: 7 % (ref 3.0–12.0)
Neutro Abs: 5.3 10*3/uL (ref 1.4–7.7)
Neutrophils Relative %: 72.8 % (ref 43.0–77.0)
Platelets: 202 10*3/uL (ref 150.0–400.0)
RBC: 5.01 Mil/uL (ref 4.22–5.81)
RDW: 13.4 % (ref 11.5–15.5)
WBC: 7.3 10*3/uL (ref 4.0–10.5)

## 2019-04-03 LAB — HEMOGLOBIN A1C: Hgb A1c MFr Bld: 6.5 % (ref 4.6–6.5)

## 2019-04-03 LAB — POC URINALSYSI DIPSTICK (AUTOMATED)
Glucose, UA: NEGATIVE
Leukocytes, UA: NEGATIVE
Protein, UA: POSITIVE — AB
Spec Grav, UA: 1.01 (ref 1.010–1.025)
Urobilinogen, UA: 1 E.U./dL
pH, UA: 7.5 (ref 5.0–8.0)

## 2019-04-03 LAB — LIPID PANEL
Cholesterol: 135 mg/dL (ref 0–200)
HDL: 29.9 mg/dL — ABNORMAL LOW (ref >39.00)
LDL Cholesterol: 72 mg/dL (ref 0–99)
NonHDL: 105.12
Total CHOL/HDL Ratio: 5
Triglycerides: 167 mg/dL — ABNORMAL HIGH (ref 0.0–149.0)
VLDL: 33.4 mg/dL (ref 0.0–40.0)

## 2019-04-03 LAB — HEPATIC FUNCTION PANEL
ALT: 21 U/L (ref 0–53)
AST: 18 U/L (ref 0–37)
Albumin: 4.3 g/dL (ref 3.5–5.2)
Alkaline Phosphatase: 84 U/L (ref 39–117)
Bilirubin, Direct: 0.2 mg/dL (ref 0.0–0.3)
Total Bilirubin: 0.8 mg/dL (ref 0.2–1.2)
Total Protein: 6.5 g/dL (ref 6.0–8.3)

## 2019-04-03 LAB — BASIC METABOLIC PANEL
BUN: 21 mg/dL (ref 6–23)
CO2: 33 mEq/L — ABNORMAL HIGH (ref 19–32)
Calcium: 9.5 mg/dL (ref 8.4–10.5)
Chloride: 96 mEq/L (ref 96–112)
Creatinine, Ser: 0.94 mg/dL (ref 0.40–1.50)
GFR: 77.93 mL/min (ref >60.00)
Glucose, Bld: 148 mg/dL — ABNORMAL HIGH (ref 70–99)
Potassium: 4.3 mEq/L (ref 3.5–5.1)
Sodium: 138 mEq/L (ref 135–145)

## 2019-04-03 MED ORDER — GABAPENTIN 300 MG PO CAPS: 300.0000 mg | ORAL_CAPSULE | Freq: Every day | ORAL | 11 refills | Status: DC

## 2019-04-03 MED ORDER — POTASSIUM CHLORIDE ER 10 MEQ PO TBCR: 10.0000 meq | EXTENDED_RELEASE_TABLET | Freq: Every day | ORAL | 3 refills | Status: DC

## 2019-04-03 MED ORDER — ATORVASTATIN CALCIUM 40 MG PO TABS: 40.0000 mg | ORAL_TABLET | Freq: Every day | ORAL | 3 refills | Status: DC

## 2019-04-03 MED ORDER — LOSARTAN POTASSIUM 100 MG PO TABS: 100.0000 mg | ORAL_TABLET | Freq: Every day | ORAL | 3 refills | Status: DC

## 2019-04-03 MED ORDER — ATENOLOL-CHLORTHALIDONE 50-25 MG PO TABS: 1.0000 | ORAL_TABLET | Freq: Every day | ORAL | 3 refills | Status: DC

## 2019-04-03 NOTE — Patient Instructions (Signed)
Health Maintenance Due  Topic Date Due  . HEMOGLOBIN A1C  09/12/2018  . INFLUENZA VACCINE  01/05/2019  . OPHTHALMOLOGY EXAM  01/05/2019  . FOOT EXAM  03/14/2019    Depression screen [facility] 2/9 03/13/2018 07/07/2016 07/07/2016  Decreased Interest 0 0 0  Down, Depressed, Hopeless 0 0 0  PHQ - 2 Score 0 0 0

## 2019-04-03 NOTE — Progress Notes (Signed)
   Subjective:    Patient ID: Robert [name], male    DOB: 1941/01/23, 78 y.o.   MRN: [id number]  HPI Here for a wellness exam. He feels fine. He saw Dr. Jeffie Pollock in May and his PSA is stable at 0.034.    Review of Systems  Constitutional: Negative.   HENT: Negative.   Eyes: Negative.   Respiratory: Negative.   Cardiovascular: Negative.   Gastrointestinal: Negative.   Genitourinary: Negative.   Musculoskeletal: Negative.   Skin: Negative.   Neurological: Negative.   Psychiatric/Behavioral: Negative.        Objective:   Physical Exam Constitutional:      General: He is not in acute distress.    Appearance: He is well-developed. He is obese. He is not diaphoretic.  HENT:     Head: Normocephalic and atraumatic.     Right Ear: External ear normal.     Left Ear: External ear normal.     Nose: Nose normal.     Mouth/Throat:     Pharynx: No oropharyngeal exudate.  Eyes:     General: No scleral icterus.       Right eye: No discharge.        Left eye: No discharge.     Conjunctiva/sclera: Conjunctivae normal.     Pupils: Pupils are equal, round, and reactive to light.  Neck:     Musculoskeletal: Neck supple.     Thyroid: No thyromegaly.     Vascular: No JVD.     Trachea: No tracheal deviation.  Cardiovascular:     Rate and Rhythm: Normal rate and regular rhythm.     Heart sounds: Normal heart sounds. No murmur. No friction rub. No gallop.   Pulmonary:     Effort: Pulmonary effort is normal. No respiratory distress.     Breath sounds: Normal breath sounds. No wheezing or rales.  Chest:     Chest wall: No tenderness.  Abdominal:     General: Bowel sounds are normal. There is no distension.     Palpations: Abdomen is soft. There is no mass.     Tenderness: There is no abdominal tenderness. There is no guarding or rebound.  Genitourinary:    Penis: No tenderness.   Musculoskeletal: Normal range of motion.        General: No tenderness.  Lymphadenopathy:     Cervical:  No cervical adenopathy.  Skin:    General: Skin is warm and dry.     Coloration: Skin is not pale.     Findings: No erythema or rash.  Neurological:     Mental Status: He is alert and oriented to person, place, and time.     Cranial Nerves: No cranial nerve deficit.     Motor: No abnormal muscle tone.     Coordination: Coordination normal.     Deep Tendon Reflexes: Reflexes are normal and symmetric. Reflexes normal.  Psychiatric:        Behavior: Behavior normal.        Thought Content: Thought content normal.        Judgment: Judgment normal.           Assessment & Plan:  Well exam. We discussed diet and exercise. Get fasting labs.  [name], MD

## 2019-04-04 LAB — TSH: TSH: 2.86 u[IU]/mL (ref 0.35–4.50)

## 2019-04-09 ENCOUNTER — Telehealth: Payer: Self-pay | Admitting: *Deleted

## 2019-04-09 NOTE — Telephone Encounter (Signed)
Pt called due to not being able to review results via MyChart.  Pt given lab results and recommendations per notes of Dr.Fry on 04/05/19. Pt verbalized understanding.

## 2019-07-05 ENCOUNTER — Ambulatory Visit: Payer: PPO

## 2019-07-11 ENCOUNTER — Ambulatory Visit (INDEPENDENT_AMBULATORY_CARE_PROVIDER_SITE_OTHER): Payer: PPO

## 2019-07-11 VITALS — BP 138/72 | Temp 98.7°F | Ht 66.0 in | Wt 223.0 lb

## 2019-07-11 DIAGNOSIS — Z0000 Encounter for general adult medical examination without abnormal findings: Secondary | ICD-10-CM

## 2019-07-11 NOTE — Patient Instructions (Addendum)
[name] , Thank you for taking time to participate in your Medicare Wellness Visit. I appreciate your ongoing commitment to your health goals. Please review the following plan we discussed and let me know if I can assist you in the future.   Screening recommendations/referrals: Colorectal Screening: colonoscopy completed. No longer needed now due to age.  Vision and Dental Exams: Recommended annual ophthalmology exams for early detection of glaucoma and other disorders of the eye. Patient reports seeing eye provider annually every August. Recommended annual dental exams for proper oral hygiene. Patient reports seeing dentist 2 x year.  Diabetic Exams: Diabetic Eye Exam: patient reports this was done in August 2020. Diabetic Foot Exam: completed 03/13/2017; past due since 03/14/2019 but patient reports that "Dr. Ninfa Linden keeps an eye on all of that".  Vaccinations: Influenza vaccine: completed 04/03/2019; due again in fall 2021. Pneumococcal vaccine: completed 04/24/2008 & 02/22/2016. Up to date. Tdap vaccine: completed 02/02/2018; due again 02/03/2028. Up to date.  Shingles vaccine: Please call your pharmacy to determine your out of pocket expense for the Shingrix vaccine. You may receive this vaccine at your local pharmacy. This is a series of two injections to be given 2-6 months apart.  Advanced directives: Advance directives discussed with you today. Once you complete the documents that you have at home, please bring a copy of your POA (Power of [place]) and/or Living Will to your next appointment.  Goals: Recommend to drink at least 6-8 8oz glasses of water per day.  Recommend to exercise for at least 150 minutes per week.  Recommend to remove any items from the home that may cause slips or trips.  Recommend to decrease portion sizes by eating 3 small healthy meals and at least 2 healthy snacks per day.  Next appointment: Please schedule your Annual Wellness Visit with your  Nurse Health Advisor in one year.  Preventive Care 49 Years and Older, Male Preventive care refers to lifestyle choices and visits with your health care provider that can promote health and wellness. What does preventive care include?  A yearly physical exam. This is also called an annual well check.  Dental exams once or twice a year.  Routine eye exams. Ask your health care provider how often you should have your eyes checked.  Personal lifestyle choices, including:  Daily care of your teeth and gums.  Regular physical activity.  Eating a healthy diet.  Avoiding tobacco and drug use.  Limiting alcohol use.  Practicing safe sex.  Taking low doses of aspirin every day if recommended by your health care provider..  Taking vitamin and mineral supplements as recommended by your health care provider. What happens during an annual well check? The services and screenings done by your health care provider during your annual well check will depend on your age, overall health, lifestyle risk factors, and family history of disease. Counseling  Your health care provider may ask you questions about your:  Alcohol use.  Tobacco use.  Drug use.  Emotional well-being.  Home and relationship well-being.  Sexual activity.  Eating habits.  History of falls.  Memory and ability to understand (cognition).  Work and work Statistician. Screening  You may have the following tests or measurements:  Height, weight, and BMI.  Blood pressure.  Lipid and cholesterol levels. These may be checked every 5 years, or more frequently if you are over 17 years old.  Skin check.  Lung cancer screening. You may have this screening every year starting at age  55 if you have a 30-pack-year history of smoking and currently smoke or have quit within the past 15 years.  Fecal occult blood test (FOBT) of the stool. You may have this test every year starting at age 73.  Flexible sigmoidoscopy or  colonoscopy. You may have a sigmoidoscopy every 5 years or a colonoscopy every 10 years starting at age 41.  Prostate cancer screening. Recommendations will vary depending on your family history and other risks.  Hepatitis C blood test.  Hepatitis B blood test.  Sexually transmitted disease (STD) testing.  Diabetes screening. This is done by checking your blood sugar (glucose) after you have not eaten for a while (fasting). You may have this done every 1-3 years.  Abdominal aortic aneurysm (AAA) screening. You may need this if you are a current or former smoker.  Osteoporosis. You may be screened starting at age 35 if you are at high risk. Talk with your health care provider about your test results, treatment options, and if necessary, the need for more tests. Vaccines  Your health care provider may recommend certain vaccines, such as:  Influenza vaccine. This is recommended every year.  Tetanus, diphtheria, and acellular pertussis (Tdap, Td) vaccine. You may need a Td booster every 10 years.  Zoster vaccine. You may need this after age 15.  Pneumococcal 13-valent conjugate (PCV13) vaccine. One dose is recommended after age 40.  Pneumococcal polysaccharide (PPSV23) vaccine. One dose is recommended after age 62. Talk to your health care provider about which screenings and vaccines you need and how often you need them. This information is not intended to replace advice given to you by your health care provider. Make sure you discuss any questions you have with your health care provider. Document Released: 06/19/2015 Document Revised: 02/10/2016 Document Reviewed: 03/24/2015 Elsevier Interactive Patient Education  2017 [place] Prevention in the Home Falls can cause injuries. They can happen to people of all ages. There are many things you can do to make your home safe and to help prevent falls. What can I do on the outside of my home?  Regularly fix the edges of  walkways and driveways and fix any cracks.  Remove anything that might make you trip as you walk through a door, such as a raised step or threshold.  Trim any bushes or trees on the path to your home.  Use bright outdoor lighting.  Clear any walking paths of anything that might make someone trip, such as rocks or tools.  Regularly check to see if handrails are loose or broken. Make sure that both sides of any steps have handrails.  Any raised decks and porches should have guardrails on the edges.  Have any leaves, snow, or ice cleared regularly.  Use sand or salt on walking paths during winter.  Clean up any spills in your garage right away. This includes oil or grease spills. What can I do in the bathroom?  Use night lights.  Install grab bars by the toilet and in the tub and shower. Do not use towel bars as grab bars.  Use non-skid mats or decals in the tub or shower.  If you need to sit down in the shower, use a plastic, non-slip stool.  Keep the floor dry. Clean up any water that spills on the floor as soon as it happens.  Remove soap buildup in the tub or shower regularly.  Attach bath mats securely with double-sided non-slip rug tape.  Do not have throw  rugs and other things on the floor that can make you trip. What can I do in the bedroom?  Use night lights.  Make sure that you have a light by your bed that is easy to reach.  Do not use any sheets or blankets that are too big for your bed. They should not hang down onto the floor.  Have a firm chair that has side arms. You can use this for support while you get dressed.  Do not have throw rugs and other things on the floor that can make you trip. What can I do in the kitchen?  Clean up any spills right away.  Avoid walking on wet floors.  Keep items that you use a lot in easy-to-reach places.  If you need to reach something above you, use a strong step stool that has a grab bar.  Keep electrical cords  out of the way.  Do not use floor polish or wax that makes floors slippery. If you must use wax, use non-skid floor wax.  Do not have throw rugs and other things on the floor that can make you trip. What can I do with my stairs?  Do not leave any items on the stairs.  Make sure that there are handrails on both sides of the stairs and use them. Fix handrails that are broken or loose. Make sure that handrails are as long as the stairways.  Check any carpeting to make sure that it is firmly attached to the stairs. Fix any carpet that is loose or worn.  Avoid having throw rugs at the top or bottom of the stairs. If you do have throw rugs, attach them to the floor with carpet tape.  Make sure that you have a light switch at the top of the stairs and the bottom of the stairs. If you do not have them, ask someone to add them for you. What else can I do to help prevent falls?  Wear shoes that:  Do not have high heels.  Have rubber bottoms.  Are comfortable and fit you well.  Are closed at the toe. Do not wear sandals.  If you use a stepladder:  Make sure that it is fully opened. Do not climb a closed stepladder.  Make sure that both sides of the stepladder are locked into place.  Ask someone to hold it for you, if possible.  Clearly mark and make sure that you can see:  Any grab bars or handrails.  First and last steps.  Where the edge of each step is.  Use tools that help you move around (mobility aids) if they are needed. These include:  Canes.  Walkers.  Scooters.  Crutches.  Turn on the lights when you go into a dark area. Replace any light bulbs as soon as they burn out.  Set up your furniture so you have a clear path. Avoid moving your furniture around.  If any of your floors are uneven, fix them.  If there are any pets around you, be aware of where they are.  Review your medicines with your doctor. Some medicines can make you feel dizzy. This can increase  your chance of falling. Ask your doctor what other things that you can do to help prevent falls. This information is not intended to replace advice given to you by your health care provider. Make sure you discuss any questions you have with your health care provider. Document Released: 03/19/2009 Document Revised: 10/29/2015 Document Reviewed: 06/27/2014  Chartered certified accountant Patient Education  [employer].

## 2019-07-11 NOTE — Progress Notes (Signed)
This visit is being conducted via phone call due to the COVID-19 pandemic. This patient has given me verbal consent via phone to conduct this visit, patient states they are participating from their home address. Some vital signs may be absent or patient reported.   Patient identification: identified by name, DOB, and current address.  Location provider: [facility], Office Persons participating in the virtual visit:  [name] and Robert Forts, LPN.    Subjective:   Robert [name] is a 79 y.o. male who presents for Medicare Annual/Subsequent preventive examination.  Robert [name] is doing well at this time. He is eating 3 healthy, balanced meals every day and walking 3-4 days a week. He anticipates he will receive first covid vaccine on 07/13/19.  Review of Systems:  No ROS; Annual Medicare Wellness Visit Cardiac Risk Factors include: male gender;diabetes mellitus;dyslipidemia    Objective:    Vitals: BP 138/72   Temp 98.7 F (37.1 C)   Ht 5\' 6"  (1.676 m)   Wt 223 lb (101.2 kg)   BMI 35.99 kg/m   Body mass index is 35.99 kg/m.  Advanced Directives 07/11/2019 01/09/2019 03/13/2018 10/20/2016  Does Patient Have a Medical Advance Directive? No No No No  Would patient like information on creating a medical advance directive? No - Patient declined No - Patient declined - No - Patient declined    Tobacco Social History   Tobacco Use  Smoking Status Never Smoker  Smokeless Tobacco Never Used     Counseling given: Not Answered   Clinical Intake:  Pre-visit preparation completed: Yes  Pain : No/denies pain     BMI - recorded: 35.99 Nutritional Status: BMI > 30  Obese Nutritional Risks: Other (Comment) Diabetes: No  How often do you need to have someone help you when you read instructions, pamphlets, or other written materials from your doctor or pharmacy?: 1 - Never What is the last grade level you completed in school?: 4 years college  Interpreter Needed?:  No  Information entered by :: Robert Forts, LPN.  Past Medical History:  Diagnosis Date  . CAD (coronary artery disease)    per pt cardiologist dr Johnsie Cancel, last office visit 02-05-2010, currently followed by pcp , dr fry  . Diverticulosis of colon   . ED (erectile dysfunction) of non-organic origin   . History of colon polyps    02/ 2009 benign  . Hyperlipidemia   . Hypertension   . Nodular prostate with lower urinary tract symptoms   . Prostate cancer [facility]) urologist-  dr wrenn/  oncologist-  dr Tammi Klippel   dx 06-15-2016,  Stage T2a, Gleason 3+3,  PSA 4.64,  vol 23.4cc  . S/P CABG x 4 05/02/2005   LIMA to LAD,  radial graft to CFx marginal,  SVG to diagonal and RCA  . Wears glasses    Past Surgical History:  Procedure Laterality Date  . CARDIAC CATHETERIZATION  04-29-2005  dr w. Albertine Patricia   severe 2V CAD-- 90% LAD diagonal, 80% CFx ostium,  50% pRCA  . COLONOSCOPY  08/03/2007   per Dr. Fuller Plan, benign polyp, he declines to have any more   . CORONARY ARTERY BYPASS GRAFT  05-02-2005  dr Lucianne Lei tright   LIMA to LAD,  radial graft to CFx marginal,  SVG to diagonal and RCA  . EXICIOSN MASS INDEX FINGER Right 07/2015   per pt benign  . PROSTATE BIOPSY  06/15/2016  . RADIOACTIVE SEED IMPLANT [name] 10/20/2016   Procedure: RADIOACTIVE SEED IMPLANT/BRACHYTHERAPY  IMPLANT, 71 seeds implanted, no seeds found in bladder;  Surgeon: Irine Seal, MD;  Location: [facility];  Service: Urology;  Laterality: [name];   Family History  Problem Relation Age of Onset  . Sudden death Other   . Cancer Maternal Aunt        ovarian  . Cancer Maternal Uncle        prostate  . Cancer Maternal Grandmother        breast  . Cancer Maternal Grandfather        prostate  . Cancer Maternal Uncle        prostate  . Cancer Maternal Uncle        prostate  . Cancer Maternal Uncle        prostate   Social History   Socioeconomic History  . Marital status: Married    Spouse name: Not on file  .  Number of children: 2  . Years of education: 4 years college  . Highest education level: Bachelor's degree (e.g., BA, AB, BS)  Occupational History  . Occupation: retired  Tobacco Use  . Smoking status: Never Smoker  . Smokeless tobacco: Never Used  Substance and Sexual Activity  . Alcohol use: No    Alcohol/week: 0.0 standard drinks  . Drug use: No  . Sexual activity: Not on file  Other Topics Concern  . Not on file  Social History Narrative   HH 2   Married   2 children locally   3 grandchildren   Social Determinants of Health   Financial Resource Strain: Low Risk   . Difficulty of Paying Living Expenses: Not hard at all  Food Insecurity: No Food Insecurity  . Worried About Charity fundraiser in the Last Year: Never true  . Ran Out of Food in the Last Year: Never true  Transportation Needs: No Transportation Needs  . Lack of Transportation (Medical): No  . Lack of Transportation (Non-Medical): No  Physical Activity: Insufficiently Active  . Days of Exercise per Week: 3 days  . Minutes of Exercise per Session: 30 min  Stress: No Stress Concern Present  . Feeling of Stress : Only a little  Social Connections: Not Isolated  . Frequency of Communication with Friends and Family: More than three times a week  . Frequency of Social Gatherings with Friends and Family: Twice a week  . Attends Religious Services: More than 4 times per year  . Active Member of Clubs or Organizations: Yes  . Attends Archivist Meetings: More than 4 times per year  . Marital Status: Married    Outpatient Encounter Medications as of 07/11/2019  Medication Sig  . aspirin 81 MG tablet Take 81 mg by mouth daily.    Marland Kitchen atenolol-chlorthalidone (TENORETIC) 50-25 MG tablet Take 1 tablet by mouth daily.  Marland Kitchen atorvastatin (LIPITOR) 40 MG tablet Take 1 tablet (40 mg total) by mouth daily.  Marland Kitchen gabapentin (NEURONTIN) 300 MG capsule Take 1 capsule (300 mg total) by mouth at bedtime.  Marland Kitchen losartan  (COZAAR) 100 MG tablet Take 1 tablet (100 mg total) by mouth daily.  . potassium chloride (KLOR-CON) 10 MEQ tablet Take 1 tablet (10 mEq total) by mouth daily.  . tamsulosin (FLOMAX) 0.4 MG CAPS capsule Take 0.4 mg by mouth.   No facility-administered encounter medications on file as of 07/11/2019.    Activities of Daily Living In your present state of health, do you have any difficulty performing the following activities: 07/11/2019  Hearing? N  Vision? N  Difficulty concentrating or making decisions? N  Walking or climbing stairs? N  Dressing or bathing? N  Doing errands, shopping? N  Preparing Food and eating ? N  Using the Toilet? N  In the past six months, have you accidently leaked urine? Y  Do you have problems with loss of bowel control? N  Managing your Medications? N  Managing your Finances? N  Housekeeping or managing your Housekeeping? N  Some recent data might be hidden    Patient Care Team: Laurey Morale, MD as PCP - General   Assessment:   This is a routine wellness examination for [place].  Exercise Activities and Dietary recommendations Current Exercise Habits: Home exercise routine, Type of exercise: walking, Time (Minutes): 30, Frequency (Times/Week): 3, Weekly Exercise (Minutes/Week): 90, Intensity: Moderate, Exercise limited by: None identified  Goals    . Patient Stated     Lose some weight  Did cardiac rehab was very good and helpful  Cut back on fried foods          Fall Risk Fall Risk  07/11/2019 03/13/2018 07/07/2016 07/07/2016 02/22/2016  Falls in the past year? 1 No No No No  Number falls in past yr: 0 - - - -  Injury with Fall? 1 - - - -  Risk for fall due to : History of fall(s);Medication side effect - - - -  Follow up Falls evaluation completed;Education provided;Falls prevention discussed - - - -   Is the patient's home free of loose throw rugs in walkways, pet beds, electrical cords, etc?   yes      Grab bars in the bathroom? yes       Handrails on the stairs?   yes      Adequate lighting?   yes  Timed Get Up and Go Performed: [name] due to telephone visit  Depression Screen PHQ 2/9 Scores 07/11/2019 03/13/2018 07/07/2016 07/07/2016  PHQ - 2 Score 0 0 0 0    Cognitive Function MMSE - Mini Mental State Exam 03/13/2018  Not completed: (No Data)     6CIT Screen 07/11/2019  What Year? 0 points  What month? 0 points  What time? 0 points  Count back from 20 0 points  Months in reverse 0 points  Repeat phrase 0 points  Total Score 0    Immunization History  Administered Date(s) Administered  . Fluad Quad(high Dose 65+) 04/03/2019  . Influenza Split 03/06/2013  . Influenza Whole 04/13/2007, 03/06/2009  . Influenza, High Dose Seasonal PF 02/22/2016, 03/10/2017, 03/13/2018  . Influenza-Unspecified 11/13/2014  . Pneumococcal Conjugate-13 02/22/2016  . Pneumococcal Polysaccharide-23 04/24/2008  . Td 02/02/2018  . Tdap 08/13/2010    Qualifies for Shingles Vaccine? yes  Screening Tests Health Maintenance  Topic Date Due  . OPHTHALMOLOGY EXAM  01/05/2019  . FOOT EXAM  03/14/2019  . HEMOGLOBIN A1C  10/02/2019  . TETANUS/TDAP  02/03/2028  . INFLUENZA VACCINE  Completed  . PNA vac Low Risk Adult  Completed   Cancer Screenings: Lung: Low Dose CT Chest recommended if Age 6-80 years, 30 pack-year currently smoking OR have quit w/in 15years. Patient does not qualify. Colorectal: aged out  Additional Screenings:  Hepatitis C Screening: [name] due to age.      Plan:   Mr. Eusebio will be receiving first covid vaccine on 07/13/19. He intends to obtain shingrix vaccines. Diabetic foot exam is needed as well. Recommended he increase his water intake and increase amount of physical  exercise with verbalized understanding.  I have personally reviewed and noted the following in the patient's chart:   . Medical and social history . Use of alcohol, tobacco or illicit drugs  . Current medications and supplements . Functional ability  and status . Nutritional status . Physical activity . Advanced directives . List of other physicians . Hospitalizations, surgeries, and ER visits in previous 12 months . Vitals . Screenings to include cognitive, depression, and falls . Referrals and appointments  In addition, I have reviewed and discussed with patient certain preventive protocols, quality metrics, and best practice recommendations. A written personalized care plan for preventive services as well as general preventive health recommendations were provided to patient.     Robert Forts, LPN  [id number]

## 2019-07-13 ENCOUNTER — Ambulatory Visit: Payer: PPO | Attending: Internal Medicine

## 2019-07-13 DIAGNOSIS — Z23 Encounter for immunization: Secondary | ICD-10-CM

## 2019-07-13 NOTE — Progress Notes (Signed)
   Covid-19 Vaccination Clinic  Name:  Robert [name]    MRN: [id number] DOB: Oct 19, 1940  07/13/2019  Robert [name] was observed post Covid-19 immunization for 15 minutes without incidence. He was provided with Vaccine Information Sheet and instruction to access the V-Safe system.   Robert [name] was instructed to call 911 with any severe reactions post vaccine: Marland Kitchen Difficulty breathing  . Swelling of your face and throat  . A fast heartbeat  . A bad rash all over your body  . Dizziness and weakness    Immunizations Administered    Name Date Dose VIS Date Route   Pfizer COVID-19 Vaccine 07/13/2019  3:08 PM 0.3 mL 05/17/2019 Intramuscular   Manufacturer: [place]   Lot: [id number]   [place]: [id number]

## 2019-08-07 ENCOUNTER — Ambulatory Visit: Payer: PPO | Attending: Internal Medicine

## 2019-08-07 DIAGNOSIS — Z23 Encounter for immunization: Secondary | ICD-10-CM | POA: Insufficient documentation

## 2019-08-07 NOTE — Progress Notes (Signed)
   Covid-19 Vaccination Clinic  Name:  Robert [name]    MRN: [id number] DOB: 08/31/1940  08/07/2019  [name] was observed post Covid-19 immunization for 15 minutes without incident. He was provided with Vaccine Information Sheet and instruction to access the V-Safe system.   [name] was instructed to call 911 with any severe reactions post vaccine: Marland Kitchen Difficulty breathing  . Swelling of face and throat  . A fast heartbeat  . A bad rash all over body  . Dizziness and weakness   Immunizations Administered    Name Date Dose VIS Date Route   Pfizer COVID-19 Vaccine 08/07/2019 10:49 AM 0.3 mL 05/17/2019 Intramuscular   Manufacturer: [place]   Lot: [id number]   [place]: [id number]

## 2019-08-14 ENCOUNTER — Telehealth: Payer: Self-pay | Admitting: Family Medicine

## 2019-08-14 NOTE — Chronic Care Management (AMB) (Signed)
  Chronic Care Management   Note  08/14/2019 Name: EROL ODAM MRN: [id number] DOB: 1940-07-29  DAYLYN NAVEDO is a 79 y.o. year old male who is a primary care patient of Laurey Morale, MD. I reached out to [name] by phone today in response to a referral sent by Mr. Meriam Sprague Arquette's PCP, Laurey Morale, MD.   Mr. Koike was given information about Chronic Care Management services today including:  1. CCM service includes personalized support from designated clinical staff supervised by his physician, including individualized plan of care and coordination with other care providers 2. 24/7 contact phone numbers for assistance for urgent and routine care needs. 3. Service will only be billed when office clinical staff spend 20 minutes or more in a month to coordinate care. 4. Only one practitioner may furnish and bill the service in a calendar month. 5. The patient may stop CCM services at any time (effective at the end of the month) by phone call to the office staff.   Patient agreed to services and verbal consent obtained.   Follow up plan:   Raynicia Dukes [employer] Scheduler

## 2019-08-27 ENCOUNTER — Telehealth: Payer: Self-pay

## 2019-08-27 DIAGNOSIS — E785 Hyperlipidemia, unspecified: Secondary | ICD-10-CM

## 2019-08-27 DIAGNOSIS — E1140 Type 2 diabetes mellitus with diabetic neuropathy, unspecified: Secondary | ICD-10-CM

## 2019-08-27 NOTE — Telephone Encounter (Signed)
I am requesting an ambulatory referral to CCM be placed for this patient. Referral will need to have 2 current diagnosis attached to it. Thank you!  

## 2019-08-28 NOTE — Telephone Encounter (Signed)
Done

## 2019-08-30 ENCOUNTER — Ambulatory Visit: Payer: PPO

## 2019-08-30 DIAGNOSIS — E785 Hyperlipidemia, unspecified: Secondary | ICD-10-CM

## 2019-08-30 DIAGNOSIS — E1140 Type 2 diabetes mellitus with diabetic neuropathy, unspecified: Secondary | ICD-10-CM

## 2019-08-30 DIAGNOSIS — I10 Essential (primary) hypertension: Secondary | ICD-10-CM

## 2019-08-30 NOTE — Patient Instructions (Addendum)
Visit Information  Goals Addressed            This Visit's Progress   . Pharmacy Care Plan       CARE PLAN ENTRY  Current Barriers:  . Chronic Disease Management support, education, and care coordination needs related to  hypertension, diabetes, hyperlipidemia/ coronary atherosclerosis, neuropathy, BPH, edema   Pharmacist Clinical Goal(s):  Marland Kitchen Work with the care management team to address educational, disease management, and care coordination needs  . Call provider office for new or worsened signs and symptoms  . Over the next month, continue self health monitoring activities as directed today (blood pressure monitoring).  . Call care management team with questions or concerns.  . Diabetes:  Marland Kitchen Maintain A1c < 7.0%. . Current A1c: 6.5% (04/03/2019) . Maintain up to date on diabetes preventative health (eye exams every 1 to 2 years and foot exams at least once yearly).  . Blood pressure:  Marland Kitchen Maintain Blood pressure <130/80 mmHg  . Recent home blood pressure reading: 129/71 mmHg . Maintain low salt diet.  . High cholesterol:  . Cholesterol goals: Total Cholesterol goal under 200, Triglycerides goal under 150, HDL goal above 40 (men) or above 50 (women), LDL goal under 100.  . Current cholesterol levels (04/03/2019) - Total cholesterol: 135 - Triglycerides: 167 - HDL: 29.90 - LDL: 72 . Nerve pain . Continue to see improvement in pain level. . Prostate health . Minimize symptoms related to prostate.   Interventions: . Comprehensive medication review performed. . Evaluation of current treatment plans and patient's adherence to plan as established by provider . Assessed patient's education and care coordination needs . Provided disease specific education to patient.  . Diabetes . Discussed importance of diabetes preventative health exams. . Obtain diabetic eye exam every 1 to 2 years.  . Obtain at least once yearly foot exams. . Continue: diet and exercise modifications.  - We  discussed modifying lifestyle, including to participate in moderate physical activity (e.g., walking) at least 150 minutes per week.  - Discussed a Mediterranean eating plan with an emphasis on whole grains, legumes, nuts, fruits, and vegetables and minimal refined and processed foods. . Blood pressure:  . Discussed need to continue checking blood pressure at home.  . Discussed diet modifications. DASH diet:  following a diet emphasizing fruits and vegetables and low-fat dairy products along with whole grains, fish, poultry, and nuts. Reducing red meats and sugars.  . Exercising . Reducing the amount of salt intake to 1500mg /per day.  . Recommend using a salt substitute to replace your salt if you need flavor.  . Weight reduction- We discussed losing 5-10% of body weight . Continue:  - atenolol/ chlorthalidone 50-25mg , 1 tablet once daily  - losartan 100mg , 1 tablet once daily  . High cholesterol/ coronary atherosclerosis  . How to reduce cholesterol through diet/weight management and physical activity.    . We discussed how a diet high in plant sterols (fruits/vegetables/nuts/whole grains/legumes) may reduce your cholesterol.  Encouraged increasing fiber to a daily intake of 10-25g/day  . Continue:  - atorvastatin 40mg , 1 tablet once daily  - aspirin 81mg , 1 tablet once daily   . Nerve pain . Continue: gabapentin 300mg , 1 capsule at bedtime . Managed by Dr. Ninfa Linden.  . Prostate health . Continue: tamsulosin 0.4mg , 1 capsule once daily . Edema . Continue to monitor.   . Call provider's office for if no improvement or worsening of symptoms.   Patient Self Care Activities:  . Self  administers medications as prescribed and Calls provider office for new concerns or questions . Continue current medications as directed by providers.  . Continue following up with providers and specialists. . Continue at home blood pressure readings.  . Continue working on health habits (diet/  exercise).  Initial goal documentation        Robert [name] was given information about Chronic Care Management services today including:  1. CCM service includes personalized support from designated clinical staff supervised by his physician, including individualized plan of care and coordination with other care providers 2. 24/7 contact phone numbers for assistance for urgent and routine care needs. 3. Standard insurance, coinsurance, copays and deductibles apply for chronic care management only during months in which we provide at least 20 minutes of these services. Most insurances cover these services at 100%, however patients may be responsible for any copay, coinsurance and/or deductible if applicable. This service may help you avoid the need for more expensive face-to-face services. 4. Only one practitioner may furnish and bill the service in a calendar month. 5. The patient may stop CCM services at any time (effective at the end of the month) by phone call to the office staff.  Patient agreed to services and verbal consent obtained.   The patient verbalized understanding of instructions provided today and agreed to receive a mailed copy of patient instruction and/or educational materials. Telephone follow up appointment with pharmacy team member scheduled for: 08/27/2020  Anson Crofts, PharmD Clinical Pharmacist [facility] [place] [phone]   [place] stands for "Dietary Approaches to Stop Hypertension." The DASH eating plan is a healthy eating plan that has been shown to reduce high blood pressure (hypertension). It may also reduce your risk for type 2 diabetes, heart disease, and stroke. The DASH eating plan may also help with weight loss. What are tips for following this plan?  General guidelines  Avoid eating more than 2,300 mg (milligrams) of salt (sodium) a day. If you have hypertension, you may need to reduce your sodium intake to  1,500 mg a day.  Limit alcohol intake to no more than 1 drink a day for nonpregnant women and 2 drinks a day for men. One drink equals 12 oz of beer, 5 oz of wine, or 1 oz of hard liquor.  Work with your health care provider to maintain a healthy body weight or to lose weight. Ask what an ideal weight is for you.  Get at least 30 minutes of exercise that causes your heart to beat faster (aerobic exercise) most days of the week. Activities may include walking, swimming, or biking.  Work with your health care provider or diet and nutrition specialist (dietitian) to adjust your eating plan to your individual calorie needs. Reading food labels   Check food labels for the amount of sodium per serving. Choose foods with less than 5 percent of the Daily Value of sodium. Generally, foods with less than 300 mg of sodium per serving fit into this eating plan.  To find whole grains, look for the word "whole" as the first word in the ingredient list. Shopping  Buy products labeled as "low-sodium" or "no salt added."  Buy fresh foods. Avoid canned foods and premade or frozen meals. Cooking  Avoid adding salt when cooking. Use salt-free seasonings or herbs instead of table salt or sea salt. Check with your health care provider or pharmacist before using salt substitutes.  Do not fry foods. Cook foods using  healthy methods such as baking, boiling, grilling, and broiling instead.  Cook with heart-healthy oils, such as olive, canola, soybean, or sunflower oil. Meal planning  Eat a balanced diet that includes: ? 5 or more servings of fruits and vegetables each day. At each meal, try to fill half of your plate with fruits and vegetables. ? Up to 6-8 servings of whole grains each day. ? Less than 6 oz of lean meat, poultry, or fish each day. A 3-oz serving of meat is about the same size as a deck of cards. One egg equals 1 oz. ? 2 servings of low-fat dairy each day. ? A serving of nuts, seeds, or  beans 5 times each week. ? Heart-healthy fats. Healthy fats called Omega-3 fatty acids are found in foods such as flaxseeds and coldwater fish, like sardines, salmon, and mackerel.  Limit how much you eat of the following: ? Canned or prepackaged foods. ? Food that is high in trans fat, such as fried foods. ? Food that is high in saturated fat, such as fatty meat. ? Sweets, desserts, sugary drinks, and other foods with added sugar. ? Full-fat dairy products.  Do not salt foods before eating.  Try to eat at least 2 vegetarian meals each week.  Eat more home-cooked food and less restaurant, buffet, and fast food.  When eating at a restaurant, ask that your food be prepared with less salt or no salt, if possible. What foods are recommended? The items listed may not be a complete list. Talk with your dietitian about what dietary choices are best for you. Grains Whole-grain or whole-wheat bread. Whole-grain or whole-wheat pasta. Brown rice. [place]. Bulgur. Whole-grain and low-sodium cereals. Pita bread. Low-fat, low-sodium crackers. Whole-wheat flour tortillas. Vegetables Fresh or frozen vegetables (raw, steamed, roasted, or grilled). Low-sodium or reduced-sodium tomato and vegetable juice. Low-sodium or reduced-sodium tomato sauce and tomato paste. Low-sodium or reduced-sodium canned vegetables. Fruits All fresh, dried, or frozen fruit. Canned fruit in natural juice (without added sugar). Meat and other protein foods Skinless chicken or [place]. Ground chicken or [place]. Pork with fat trimmed off. Fish and seafood. Egg whites. Dried beans, peas, or lentils. Unsalted nuts, nut butters, and seeds. Unsalted canned beans. Lean cuts of beef with fat trimmed off. Low-sodium, lean deli meat. Dairy Low-fat (1%) or fat-free (skim) milk. Fat-free, low-fat, or reduced-fat cheeses. Nonfat, low-sodium ricotta or cottage cheese. Low-fat or nonfat yogurt. Low-fat, low-sodium cheese. Fats and  oils Soft margarine without trans fats. Vegetable oil. Low-fat, reduced-fat, or light mayonnaise and salad dressings (reduced-sodium). Canola, safflower, olive, soybean, and sunflower oils. Avocado. Seasoning and other foods Herbs. Spices. Seasoning mixes without salt. Unsalted popcorn and pretzels. Fat-free sweets. What foods are not recommended? The items listed may not be a complete list. Talk with your dietitian about what dietary choices are best for you. Grains Baked goods made with fat, such as croissants, muffins, or some breads. Dry pasta or rice meal packs. Vegetables Creamed or fried vegetables. Vegetables in a cheese sauce. Regular canned vegetables (not low-sodium or reduced-sodium). Regular canned tomato sauce and paste (not low-sodium or reduced-sodium). Regular tomato and vegetable juice (not low-sodium or reduced-sodium). Angie Fava. Olives. Fruits Canned fruit in a light or heavy syrup. Fried fruit. Fruit in cream or butter sauce. Meat and other protein foods Fatty cuts of meat. Ribs. Fried meat. Berniece Salines. Sausage. Bologna and other processed lunch meats. Salami. Fatback. Hotdogs. Bratwurst. Salted nuts and seeds. Canned beans with added salt. Canned or smoked fish. Whole  eggs or egg yolks. Chicken or [place] with skin. Dairy Whole or 2% milk, cream, and half-and-half. Whole or full-fat cream cheese. Whole-fat or sweetened yogurt. Full-fat cheese. Nondairy creamers. Whipped toppings. Processed cheese and cheese spreads. Fats and oils Butter. Stick margarine. Lard. Shortening. Ghee. Bacon fat. Tropical oils, such as coconut, palm kernel, or palm oil. Seasoning and other foods Salted popcorn and pretzels. Onion salt, garlic salt, seasoned salt, table salt, and sea salt. Worcestershire sauce. Tartar sauce. Barbecue sauce. Teriyaki sauce. Soy sauce, including reduced-sodium. Steak sauce. Canned and packaged gravies. Fish sauce. Oyster sauce. Cocktail sauce. Horseradish that you find on the  shelf. Ketchup. Mustard. Meat flavorings and tenderizers. Bouillon cubes. Hot sauce and Tabasco sauce. Premade or packaged marinades. Premade or packaged taco seasonings. Relishes. Regular salad dressings. Where to find more information:  National Heart, Lung, and [place]: https://wilson-eaton.com/  American Heart Association: www.heart.org Summary  The DASH eating plan is a healthy eating plan that has been shown to reduce high blood pressure (hypertension). It may also reduce your risk for type 2 diabetes, heart disease, and stroke.  With the DASH eating plan, you should limit salt (sodium) intake to 2,300 mg a day. If you have hypertension, you may need to reduce your sodium intake to 1,500 mg a day.  When on the DASH eating plan, aim to eat more fresh fruits and vegetables, whole grains, lean proteins, low-fat dairy, and heart-healthy fats.  Work with your health care provider or diet and nutrition specialist (dietitian) to adjust your eating plan to your individual calorie needs. This information is not intended to replace advice given to you by your health care provider. Make sure you discuss any questions you have with your health care provider. Document Revised: 05/05/2017 Document Reviewed: 05/16/2016 Elsevier Patient Education  2020 [employer].

## 2019-08-30 NOTE — Chronic Care Management (AMB) (Signed)
Chronic Care Management Pharmacy  Name: Robert [name]  MRN: [id number] DOB: 1940-09-16  Initial Questions: 1. Have you seen any other providers since your last visit? NA 2. Any changes in your medicines or health? No   Chief Complaint/ HPI Etter [name],  79 y.o. , male presents for their Initial CCM visit with the clinical pharmacist via telephone due to COVID-19 Pandemic.  PCP : Robert Morale, MD  Their chronic conditions include: HTN, DM, HLD/coronary atherosclerosis, neuropathy, BPH, edema  Office Visits: 04/03/2019- Patient presented for office visit with Dr. [name], MD for annual exam. Diet and exercise were discussed with patient. Patient to obtain fasting labs.   Medications: Outpatient Encounter Medications as of 08/30/2019  Medication Sig  . aspirin 81 MG tablet Take 81 mg by mouth daily.    Marland Kitchen atenolol-chlorthalidone (TENORETIC) 50-25 MG tablet Take 1 tablet by mouth daily.  Marland Kitchen atorvastatin (LIPITOR) 40 MG tablet Take 1 tablet (40 mg total) by mouth daily.  Marland Kitchen gabapentin (NEURONTIN) 300 MG capsule Take 1 capsule (300 mg total) by mouth at bedtime.  Marland Kitchen losartan (COZAAR) 100 MG tablet Take 1 tablet (100 mg total) by mouth daily.  . potassium chloride (KLOR-CON) 10 MEQ tablet Take 1 tablet (10 mEq total) by mouth daily.  . tamsulosin (FLOMAX) 0.4 MG CAPS capsule Take 0.4 mg by mouth.   No facility-administered encounter medications on file as of 08/30/2019.   Current Diagnosis/Assessment:  Goals Addressed            This Visit's Progress   . Pharmacy Care Plan       CARE PLAN ENTRY  Current Barriers:  . Chronic Disease Management support, education, and care coordination needs related to  hypertension, diabetes, hyperlipidemia/ coronary atherosclerosis, neuropathy, BPH, edema   Pharmacist Clinical Goal(s):  Marland Kitchen Work with the care management team to address educational, disease management, and care coordination needs  . Call provider office for new or  worsened signs and symptoms  . Over the next month, continue self health monitoring activities as directed today (blood pressure monitoring).  . Call care management team with questions or concerns.  . Diabetes:  Marland Kitchen Maintain A1c < 7.0%. . Current A1c: 6.5% (04/03/2019) . Maintain up to date on diabetes preventative health (eye exams every 1 to 2 years and foot exams at least once yearly).  . Blood pressure:  Marland Kitchen Maintain Blood pressure <130/80 mmHg  . Recent home blood pressure reading: 129/71 mmHg . Maintain low salt diet.  . High cholesterol:  . Cholesterol goals: Total Cholesterol goal under 200, Triglycerides goal under 150, HDL goal above 40 (men) or above 50 (women), LDL goal under 100.  . Current cholesterol levels (04/03/2019) - Total cholesterol: 135 - Triglycerides: 167 - HDL: 29.90 - LDL: 72 . Nerve pain . Continue to see improvement in pain level. . Prostate health . Minimize symptoms related to prostate.   Interventions: . Comprehensive medication review performed. . Evaluation of current treatment plans and patient's adherence to plan as established by provider . Assessed patient's education and care coordination needs . Provided disease specific education to patient.  . Diabetes . Discussed importance of diabetes preventative health exams. . Obtain diabetic eye exam every 1 to 2 years.  . Obtain at least once yearly foot exams. . Continue: diet and exercise modifications.  - We discussed modifying lifestyle, including to participate in moderate physical activity (e.g., walking) at least 150 minutes per week.  - Discussed a Mediterranean eating plan  with an emphasis on whole grains, legumes, nuts, fruits, and vegetables and minimal refined and processed foods. . Blood pressure:  . Discussed need to continue checking blood pressure at home.  . Discussed diet modifications. DASH diet:  following a diet emphasizing fruits and vegetables and low-fat dairy products along  with whole grains, fish, poultry, and nuts. Reducing red meats and sugars.  . Exercising . Reducing the amount of salt intake to 1500mg /per day.  . Recommend using a salt substitute to replace your salt if you need flavor.  . Weight reduction- We discussed losing 5-10% of body weight . Continue:  - atenolol/ chlorthalidone 50-25mg , 1 tablet once daily  - losartan 100mg , 1 tablet once daily  . High cholesterol/ coronary atherosclerosis  . How to reduce cholesterol through diet/weight management and physical activity.    . We discussed how a diet high in plant sterols (fruits/vegetables/nuts/whole grains/legumes) may reduce your cholesterol.  Encouraged increasing fiber to a daily intake of 10-25g/day  . Continue:  - atorvastatin 40mg , 1 tablet once daily  - aspirin 81mg , 1 tablet once daily   . Nerve pain . Continue: gabapentin 300mg , 1 capsule at bedtime . Managed by Dr. Ninfa Linden.  . Prostate health . Continue: tamsulosin 0.4mg , 1 capsule once daily . Edema . Continue to monitor.   . Call provider's office for if no improvement or worsening of symptoms.   Patient Self Care Activities:  . Self administers medications as prescribed and Calls provider office for new concerns or questions . Continue current medications as directed by providers.  . Continue following up with providers and specialists. . Continue at home blood pressure readings.  . Continue working on health habits (diet/ exercise).  Initial goal documentation        Diabetes  Recent Relevant Labs: Lab Results  Component Value Date/Time   HGBA1C 6.5 04/03/2019 08:35 AM   HGBA1C 6.3 03/13/2018 10:53 AM   MICROALBUR 2.1 (H) 03/27/2014 08:50 AM    Checking BG: Never  Patient has failed these meds in past: none Patient is currently controlled on the following medications:  - diet controlled     Last diabetic Eye exam: No results found for: HMDIABEYEEXA  - recommended eye exams every year (patient obtains eye  exams every year - last eye exam was last August with Dr. Nicki [name]   Last diabetic Foot exam: No results found for: HMDIABFOOTEX  - recommended to obtain at least once yearly. Patient states he sees Dr. Ninfa Linden and gets them every year.   We discussed: diet and exercise extensively  . Preventative diabetic health exams . Maintain a low carbohydrate diet, eating 45 grams of carbohydrates per meal (3 servings of carbohydrates per meal), 15 grams of carbohydrates per snack (1 serving of carbohydrate per snack).  . Patient notes he eats same diet for years. . For exercise- he walks about 3 days/ week.   Plan Continue control with diet and exercise   Hypertension  BP today is:  <130/80  Office blood pressures are: BP Readings from Last 3 Encounters:  07/11/19 138/72  04/03/19 130/68  01/29/19 128/90   Patient has failed these meds in the past: none  Patient checks BP at home 1-2x per week  Patient home BP readings are ranging: 129/71 mmHg   Patient is controlled on:  - atenolol/ chlorthalidone 50-25mg , 1 tablet once daily  - losartan 100mg , 1 tablet once daily   We discussed diet and exercise extensively . DASH diet:  following a  diet emphasizing fruits and vegetables and low-fat dairy products along with whole grains, fish, poultry, and nuts. Reducing red meats and sugars.  . (patient notes eating same meals every week: Marland Kitchen Breakfast may include: waffles, boiled eggs, toast, canadian bacon . Lunch dinner may include: baked chicken, green beans, salad) . Exercising  . Patient states he walks about  3 days per week.   . Reducing the amount of salt intake to 1500mg /per day.  . Recommend using a salt substitute to replace your salt if you need flavor.    . Getting enough potassium in your diet equaling 3500-5000mg /day.  This helps to regulate BP by balancing out the effects of salt.   . Weight reduction- We discussed losing 5-10% of body weight.    Plan Denies dizziness/  lightheadedness/ orthostatic hypotension.  Continue current medications   Hyperlipidemia/ Coronary atherosclerosis   Lipid Panel     Component Value Date/Time   CHOL 135 04/03/2019 0835   TRIG 167.0 (H) 04/03/2019 0835   HDL 29.90 (L) 04/03/2019 0835   CHOLHDL 5 04/03/2019 0835   VLDL 33.4 04/03/2019 0835   LDLCALC 72 04/03/2019 0835    The 10-year ASCVD risk score Mikey Bussing DC Jr., et al., 2013) is: 56.2%   Values used to calculate the score:     Age: 22 years     Sex: Male     Is Non-Hispanic African American: No     Diabetic: Yes     Tobacco smoker: No     Systolic Blood Pressure: 0000000 mmHg     Is BP treated: Yes     HDL Cholesterol: 29.9 mg/dL     Total Cholesterol: 135 mg/dL   Patient has failed these meds in past: simvastatin  Patient is currently controlled on the following medications:  - atorvastatin 40mg , 1 tablet once daily  - aspirin 81mg , 1 tablet once daily    We discussed:  diet and exercise extensively  . How to reduce cholesterol through diet/weight management and physical activity.    . We discussed how a diet high in plant sterols (fruits/vegetables/nuts/whole grains/legumes) may reduce your cholesterol.  Encouraged increasing fiber to a daily intake of 10-25g/day   Plan Continue current medications  Neuropathy (nerve pain)   Patient has failed these meds in past: none  Patient is currently controlled on the following medications:  - gabapentin 300mg , 1 capsule at bedtime  We discussed:  - Patient notes taking gabapentin since last Aug/Sept and still has nerve pain every now and then. We discussed potential need to  increase gabapentin dose.   Plan Patient states managed by Dr. Ninfa Linden.  Referred patient to Dr. Ninfa Linden for gabapentin dose increase.  Continue current medications  BPH   Patient has failed these meds in past: oxybutynin, Myrbetriq Patient is currently controlled on the following medications:  - tamsulosin 0.4mg , 1 capsule once  daily  Plan Managed by urologist. (Dr. Jeffie Pollock). Has follow up appointment in May.  Continue current medications.   Edema  Patient has failed these meds in past: none  Patient is currently managed on: no medications  Plan Patient notes experiencing swelling in feet and neck. It started less than a week ago. Patient notes he did go to a wedding last weekend and ate food outside of typical diet (shrimp/ questionable salt increase).  He had used epsom salt and has started using Cooper socks and has noticed some improvement.  Consulted with Dr. [name].  Since it was improving, instructed patient to  keep monitoring swelling and call back if it worsens and make appointment with Dr. [name].   Medication Management  Patient organizes medications: patient uses pill Environmental education officer.  Barriers: patient denies barriers to obtain medications.    Follow up Follow up visit with PharmD in 1 year. Will conduct general telephone calls for periodic check-ins before next visit.    Anson Crofts, PharmD Clinical Pharmacist [facility] [place] [phone]

## 2019-09-03 ENCOUNTER — Encounter: Payer: Self-pay | Admitting: Family Medicine

## 2019-09-03 ENCOUNTER — Ambulatory Visit: Payer: Self-pay

## 2019-09-03 ENCOUNTER — Ambulatory Visit: Payer: PPO | Admitting: Family Medicine

## 2019-09-03 ENCOUNTER — Other Ambulatory Visit: Payer: Self-pay

## 2019-09-03 DIAGNOSIS — M25571 Pain in right ankle and joints of right foot: Secondary | ICD-10-CM

## 2019-09-03 DIAGNOSIS — M25572 Pain in left ankle and joints of left foot: Secondary | ICD-10-CM | POA: Diagnosis not present

## 2019-09-03 MED ORDER — DICLOFENAC SODIUM 1 % EX GEL: 4.0000 g | Freq: Four times a day (QID) | CUTANEOUS | 6 refills | Status: DC | PRN

## 2019-09-03 NOTE — Progress Notes (Signed)
I saw and examined the patient with Dr. Mayer Masker and agree with assessment and plan as outlined.    Right angle pain and swelling for about 1 week.  No definite injury to cause this, but he did fall about a week before while taking out the trash.  He started having swelling and pain in both ankles [place] the beach.  Left ankle seems to be a little bit better.  Right ankle hurts mostly on the lateral aspect but he has numbness in both feet which is presumed to be from diabetic neuropathy.  Denies any chest pain, shortness of breath, palpitations.  Kidney function was normal in October.  He is not on any medications that are associated with edema.  Exam reveals 1+ edema in both lower extremities to the knees.  Right ankle is tender over the anterolateral joint line.  Positive Tinel's at the tarsal tunnel of both feet.  X-rays are unrevealing, very little arthritic change in his ankle joint.  No bone spurs, no sign of stress fracture or neoplasm.  He will use a compression sock bilaterally.  Arch supports to correct slight hyperpronation.  Voltaren gel for the right ankle as needed.  If edema issues persist, consider further work-up per PCP.  Referral given to physical therapy.

## 2019-09-03 NOTE — Progress Notes (Addendum)
Robert [name] - 79 y.o. male MRN [id number]  Date of birth: [birth year]  Office Visit Note: Visit Date: 09/03/2019 PCP: Laurey Morale, MD Referred by: Laurey Morale, MD  Subjective: Chief Complaint  Patient presents with  . bil ankle swelling x 1 week, cont. pain right ankle w/WB   HPI: Robert [name] is a 79 y.o. male who comes in today with right ankle pain x 1 week.  He reports that he had swelling in both ankles 1 week ago. He wore compression socks and soaked feet in epson salt which improved swelling in left ankle but right ankle has continued to swell and be painful with weight bearing. He has fallen two times in the past month, most recently 2 weeks ago (fell while taking out the trash). No immediate ankle pain after fall. He has recently started to have numbness/tingling over bottom of feet up to toes when he stands. No medication.  Has HTN, CAD, T2DM, most recent Hgb A1C 6.5.  ROS Otherwise per HPI.  Assessment & Plan: Visit Diagnoses:  1. Acute bilateral ankle pain     Plan:  Right ankle pain with bilateral ankle swelling. He has 1+ pitting edema up to knees on both legs with signs of venous insufficiency with color change in legs and feet. Kidney function was normal when check in October 2020. Pain could possibly be secondary to edema. X-ray of right ankle unrevealing with no arthritis, fracture noted. His neuropathic symptoms possibly from tarsal tunnel syndrome as percussion over area reproduces symptoms. Recommended hapad shoe inserts to see if offloading medial ankle joint will help with numbness/tingling as well as pain. Recommend voltaren gel QID as needed. If swelling persists, follow up with PCP for repeat kidney and cardiac evaluation.  Provided referral for PT as this can help with ankle strengthening and general improvement in function to keep him active.   Meds & Orders: No orders of the defined types were placed in this encounter.   Orders Placed This  Encounter  Procedures  . XR Ankle Complete Right  . Ambulatory referral to Physical Therapy    Follow-up: No follow-ups on file.   Procedures: No procedures performed  No notes on file   Clinical History: No specialty comments available.   He reports that he has never smoked. He has never used smokeless tobacco.  Recent Labs    04/03/19 0835  HGBA1C 6.5    Objective:  VS:  HT:    WT:   BMI:     BP:   HR: bpm  TEMP: ( )  RESP:  Physical Exam  PHYSICAL EXAM: Gen: NAD, alert, cooperative with exam, well-appearing HEENT: clear conjunctiva,  CV:  no edema, capillary refill brisk, normal rate Resp: non-labored Neuro: no gross deficits.  Psych:  alert and oriented  Ortho Exam  Right Ankle: - Inspection: mild swelling noted, no bruising - Palpation: TTP over anterolateral ankle. No TTP at MT heads, no TTP at base of 5th MT, no TTP over cuboid, no tenderness over navicular prominence, no TTP over lateral or medial malleolus. 1+ pitting edema up to knee. - Strength: Normal strength with dorsiflexion, plantarflexion, inversion, and eversion of foot; flexion and extension of toes - ROM: Full ROM - Neuro/vasc: NV intact - Special Tests: Negative anterior drawer, normal inversion test.  Reproduction of numbness symptoms with percussion over tarsal tunnel  Feet: pes planus with external rotation, pronation with walking  Imaging: No results found.  Past Medical/Family/Surgical/Social History:  Medications & Allergies reviewed per EMR, new medications updated. Patient Active Problem List   Diagnosis Date Noted  . Abrasion, knee, left, initial encounter 01/29/2019  . Unilateral primary osteoarthritis, right knee 08/29/2017  . Type 2 diabetes mellitus with diabetic neuropathy, unspecified ([place]) 03/10/2017  . Neuropathy, diabetic ([place]) 03/10/2017  . Malignant neoplasm of prostate ([place]) 07/07/2016  . OBESITY 01/08/2009  . EDEMA 01/08/2009  . ERECTILE DYSFUNCTION 04/24/2008    . BPH with urinary obstruction 04/24/2008  . Hyperlipidemia 04/13/2007  . Essential hypertension 04/13/2007  . Coronary atherosclerosis 04/13/2007   Past Medical History:  Diagnosis Date  . CAD (coronary artery disease)    per pt cardiologist dr Johnsie Cancel, last office visit 02-05-2010, currently followed by pcp , dr fry  . Diverticulosis of colon   . ED (erectile dysfunction) of non-organic origin   . History of colon polyps    02/ 2009 benign  . Hyperlipidemia   . Hypertension   . Nodular prostate with lower urinary tract symptoms   . Prostate cancer [facility]) urologist-  dr wrenn/  oncologist-  dr Tammi Klippel   dx 06-15-2016,  Stage T2a, Gleason 3+3,  PSA 4.64,  vol 23.4cc  . S/P CABG x 4 05/02/2005   LIMA to LAD,  radial graft to CFx marginal,  SVG to diagonal and RCA  . Wears glasses    Family History  Problem Relation Age of Onset  . Sudden death Other   . Cancer Maternal Aunt        ovarian  . Cancer Maternal Uncle        prostate  . Cancer Maternal Grandmother        breast  . Cancer Maternal Grandfather        prostate  . Cancer Maternal Uncle        prostate  . Cancer Maternal Uncle        prostate  . Cancer Maternal Uncle        prostate   Past Surgical History:  Procedure Laterality Date  . CARDIAC CATHETERIZATION  04-29-2005  dr w. Albertine Patricia   severe 2V CAD-- 90% LAD diagonal, 80% CFx ostium,  50% pRCA  . COLONOSCOPY  08/03/2007   per Dr. Fuller Plan, benign polyp, he declines to have any more   . CORONARY ARTERY BYPASS GRAFT  05-02-2005  dr Lucianne Lei tright   LIMA to LAD,  radial graft to CFx marginal,  SVG to diagonal and RCA  . EXICIOSN MASS INDEX FINGER Right 07/2015   per pt benign  . PROSTATE BIOPSY  06/15/2016  . RADIOACTIVE SEED IMPLANT [name] 10/20/2016   Procedure: RADIOACTIVE SEED IMPLANT/BRACHYTHERAPY IMPLANT, 71 seeds implanted, no seeds found in bladder;  Surgeon: Irine Seal, MD;  Location: [facility];  Service: Urology;  Laterality: [name];   Social  History   Occupational History  . Occupation: retired  Tobacco Use  . Smoking status: Never Smoker  . Smokeless tobacco: Never Used  Substance and Sexual Activity  . Alcohol use: No    Alcohol/week: 0.0 standard drinks  . Drug use: No  . Sexual activity: Not on file

## 2019-10-24 DIAGNOSIS — Z8546 Personal history of malignant neoplasm of prostate: Secondary | ICD-10-CM | POA: Diagnosis not present

## 2020-03-25 ENCOUNTER — Other Ambulatory Visit: Payer: Self-pay

## 2020-03-25 MED ORDER — ATENOLOL-CHLORTHALIDONE 50-25 MG PO TABS: 1.0000 | ORAL_TABLET | Freq: Every day | ORAL | 3 refills | Status: DC

## 2020-03-25 MED ORDER — LOSARTAN POTASSIUM 100 MG PO TABS: 100.0000 mg | ORAL_TABLET | Freq: Every day | ORAL | 3 refills | Status: DC

## 2020-04-06 ENCOUNTER — Encounter: Payer: Self-pay | Admitting: Family Medicine

## 2020-04-06 ENCOUNTER — Ambulatory Visit (INDEPENDENT_AMBULATORY_CARE_PROVIDER_SITE_OTHER): Payer: PPO | Admitting: Family Medicine

## 2020-04-06 ENCOUNTER — Other Ambulatory Visit: Payer: Self-pay

## 2020-04-06 VITALS — BP 140/78 | HR 82 | Temp 98.2°F | Ht 66.0 in | Wt 226.4 lb

## 2020-04-06 DIAGNOSIS — Z0000 Encounter for general adult medical examination without abnormal findings: Secondary | ICD-10-CM | POA: Diagnosis not present

## 2020-04-06 DIAGNOSIS — R739 Hyperglycemia, unspecified: Secondary | ICD-10-CM | POA: Diagnosis not present

## 2020-04-06 MED ORDER — ATORVASTATIN CALCIUM 40 MG PO TABS
40.0000 mg | ORAL_TABLET | Freq: Every day | ORAL | 3 refills | Status: DC
Start: 2020-04-06 — End: 2021-04-19

## 2020-04-06 MED ORDER — POTASSIUM CHLORIDE ER 10 MEQ PO TBCR
10.0000 meq | EXTENDED_RELEASE_TABLET | Freq: Every day | ORAL | 3 refills | Status: DC
Start: 2020-04-06 — End: 2021-08-02

## 2020-04-06 MED ORDER — GABAPENTIN 300 MG PO CAPS: 600.0000 mg | ORAL_CAPSULE | Freq: Every day | ORAL | 3 refills | Status: DC

## 2020-04-06 NOTE — Addendum Note (Signed)
Addended by: Marrion Coy on: 04/06/2020 08:51 AM   Modules accepted: Orders

## 2020-04-06 NOTE — Progress Notes (Signed)
   Subjective:    Patient ID: Robert [name], male    DOB: 1940/09/04, 79 y.o.   MRN: [id number]  HPI Here for a well exam. He feels fine. He continues to see Urology regularly.    Review of Systems  Constitutional: Negative.   HENT: Negative.   Eyes: Negative.   Respiratory: Negative.   Cardiovascular: Negative.   Gastrointestinal: Negative.   Genitourinary: Negative.   Musculoskeletal: Negative.   Skin: Negative.   Neurological: Negative.   Psychiatric/Behavioral: Negative.        Objective:   Physical Exam Constitutional:      General: He is not in acute distress.    Appearance: He is well-developed. He is obese. He is not diaphoretic.  HENT:     Head: Normocephalic and atraumatic.     Right Ear: External ear normal.     Left Ear: External ear normal.     Nose: Nose normal.     Mouth/Throat:     Pharynx: No oropharyngeal exudate.  Eyes:     General: No scleral icterus.       Right eye: No discharge.        Left eye: No discharge.     Conjunctiva/sclera: Conjunctivae normal.     Pupils: Pupils are equal, round, and reactive to light.  Neck:     Thyroid: No thyromegaly.     Vascular: No JVD.     Trachea: No tracheal deviation.  Cardiovascular:     Rate and Rhythm: Normal rate and regular rhythm.     Heart sounds: Normal heart sounds. No murmur heard.  No friction rub. No gallop.   Pulmonary:     Effort: Pulmonary effort is normal. No respiratory distress.     Breath sounds: Normal breath sounds. No wheezing or rales.  Chest:     Chest wall: No tenderness.  Abdominal:     General: Bowel sounds are normal. There is no distension.     Palpations: Abdomen is soft. There is no mass.     Tenderness: There is no abdominal tenderness. There is no guarding or rebound.  Genitourinary:    Penis: No tenderness.   Musculoskeletal:        General: No tenderness. Normal range of motion.     Cervical back: Neck supple.  Lymphadenopathy:     Cervical: No cervical  adenopathy.  Skin:    General: Skin is warm and dry.     Coloration: Skin is not pale.     Findings: No erythema or rash.  Neurological:     Mental Status: He is alert and oriented to person, place, and time.     Cranial Nerves: No cranial nerve deficit.     Motor: No abnormal muscle tone.     Coordination: Coordination normal.     Deep Tendon Reflexes: Reflexes are normal and symmetric. Reflexes normal.  Psychiatric:        Behavior: Behavior normal.        Thought Content: Thought content normal.        Judgment: Judgment normal.           Assessment & Plan:  Well exam. We discussed diet and exercise. Get fasting labs. [name], MD

## 2020-04-07 LAB — HEPATIC FUNCTION PANEL
AG Ratio: 1.8 (calc) (ref 1.0–2.5)
ALT: 23 U/L (ref 9–46)
AST: 20 U/L (ref 10–35)
Albumin: 4.2 g/dL (ref 3.6–5.1)
Alkaline phosphatase (APISO): 80 U/L (ref 35–144)
Bilirubin, Direct: 0.1 mg/dL (ref 0.0–0.2)
Globulin: 2.4 g/dL (calc) (ref 1.9–3.7)
Indirect Bilirubin: 0.6 mg/dL (calc) (ref 0.2–1.2)
Total Bilirubin: 0.7 mg/dL (ref 0.2–1.2)
Total Protein: 6.6 g/dL (ref 6.1–8.1)

## 2020-04-07 LAB — CBC WITH DIFFERENTIAL/PLATELET
Absolute Monocytes: 646 cells/uL (ref 200–950)
Basophils Absolute: 28 cells/uL (ref 0–200)
Basophils Relative: 0.4 %
Eosinophils Absolute: 320 cells/uL (ref 15–500)
Eosinophils Relative: 4.5 %
HCT: 48.2 % (ref 38.5–50.0)
Hemoglobin: 16.2 g/dL (ref 13.2–17.1)
Lymphs Abs: 1498 cells/uL (ref 850–3900)
MCH: 32.1 pg (ref 27.0–33.0)
MCHC: 33.6 g/dL (ref 32.0–36.0)
MCV: 95.4 fL (ref 80.0–100.0)
MPV: 9.7 fL (ref 7.5–12.5)
Monocytes Relative: 9.1 %
Neutro Abs: 4608 cells/uL (ref 1500–7800)
Neutrophils Relative %: 64.9 %
Platelets: 162 10*3/uL (ref 140–400)
RBC: 5.05 10*6/uL (ref 4.20–5.80)
RDW: 12 % (ref 11.0–15.0)
Total Lymphocyte: 21.1 %
WBC: 7.1 10*3/uL (ref 3.8–10.8)

## 2020-04-07 LAB — HEMOGLOBIN A1C
Hgb A1c MFr Bld: 6.7 % of total Hgb — ABNORMAL HIGH (ref <5.7)
Mean Plasma Glucose: 146 (calc)
eAG (mmol/L): 8.1 (calc)

## 2020-04-07 LAB — LIPID PANEL
Cholesterol: 125 mg/dL (ref <200)
HDL: 30 mg/dL — ABNORMAL LOW (ref >=40)
LDL Cholesterol (Calc): 72 mg/dL (calc)
Non-HDL Cholesterol (Calc): 95 mg/dL (calc) (ref <130)
Total CHOL/HDL Ratio: 4.2 (calc) (ref <5.0)
Triglycerides: 149 mg/dL (ref <150)

## 2020-04-07 LAB — BASIC METABOLIC PANEL WITH GFR
BUN: 22 mg/dL (ref 7–25)
CO2: 33 mmol/L — ABNORMAL HIGH (ref 20–32)
Calcium: 9.4 mg/dL (ref 8.6–10.3)
Chloride: 99 mmol/L (ref 98–110)
Creat: 0.91 mg/dL (ref 0.70–1.18)
GFR, Est African American: 94 mL/min/{1.73_m2} (ref >=60)
GFR, Est Non African American: 81 mL/min/{1.73_m2} (ref >=60)
Glucose, Bld: 135 mg/dL — ABNORMAL HIGH (ref 65–99)
Potassium: 4.5 mmol/L (ref 3.5–5.3)
Sodium: 140 mmol/L (ref 135–146)

## 2020-04-07 LAB — TSH: TSH: 3.13 mIU/L (ref 0.40–4.50)

## 2020-06-17 DIAGNOSIS — H40023 Open angle with borderline findings, high risk, bilateral: Secondary | ICD-10-CM | POA: Diagnosis not present

## 2020-07-15 DIAGNOSIS — H401131 Primary open-angle glaucoma, bilateral, mild stage: Secondary | ICD-10-CM | POA: Diagnosis not present

## 2020-08-26 ENCOUNTER — Telehealth: Payer: Self-pay | Admitting: Pharmacist

## 2020-08-26 NOTE — Chronic Care Management (AMB) (Signed)
I left the patient a message about his upcoming appointment on 08/27/2020 @ 9:00 am with the clinical pharmacist. He was asked to please have all medication on hand to review with the pharmacist.   Neita Goodnight) Mare Ferrari, [place] Assistant [phone]

## 2020-08-27 ENCOUNTER — Ambulatory Visit (INDEPENDENT_AMBULATORY_CARE_PROVIDER_SITE_OTHER): Payer: PPO | Admitting: Pharmacist

## 2020-08-27 DIAGNOSIS — E1140 Type 2 diabetes mellitus with diabetic neuropathy, unspecified: Secondary | ICD-10-CM

## 2020-08-27 DIAGNOSIS — E785 Hyperlipidemia, unspecified: Secondary | ICD-10-CM | POA: Diagnosis not present

## 2020-08-27 DIAGNOSIS — I10 Essential (primary) hypertension: Secondary | ICD-10-CM

## 2020-08-27 NOTE — Progress Notes (Signed)
Chronic Care Management Pharmacy Note  09/03/2020 Name:  Robert [name] MRN:  [id number] DOB:  12/04/1940  Subjective: Robert [name] is an 80 y.o. year old male who is a primary patient of Laurey Morale, MD.  The CCM team was consulted for assistance with disease management and care coordination needs.    Engaged with patient by telephone for follow up visit in response to provider referral for pharmacy case management and/or care coordination services.   Consent to Services:  The patient was given information about Chronic Care Management services, agreed to services, and gave verbal consent prior to initiation of services.  Please see initial visit note for detailed documentation.   Patient Care Team: Laurey Morale, MD as PCP - General Viona Gilmore, [facility] as Pharmacist (Pharmacist)  Recent office visits: 04/06/20 [name], MD: Patient presented for annual exam. No changes made.  Recent consult visits: 07/15/20 [name] (optometry): Patient presented for glaucoma follow up. Unable to access notes.  06/17/20 [name] (optometry): Patient presented for glaucoma follow up. Unable to access notes.  09/03/19 Eunice Blase, MD (ortho): Patient presented for bilateral ankle swelling. Recommended voltaren gel four times daily PRN.  Hospital visits: None in previous 6 months  Objective:  Lab Results  Component Value Date   CREATININE 0.91 04/06/2020   BUN 22 04/06/2020   GFR 77.93 04/03/2019   GFRNONAA 81 04/06/2020   GFRAA 94 04/06/2020   NA 140 04/06/2020   K 4.5 04/06/2020   CALCIUM 9.4 04/06/2020   CO2 33 (H) 04/06/2020   GLUCOSE 135 (H) 04/06/2020    Lab Results  Component Value Date/Time   HGBA1C 6.7 (H) 04/06/2020 08:51 AM   HGBA1C 6.5 04/03/2019 08:35 AM   GFR 77.93 04/03/2019 08:35 AM   GFR 90.82 03/13/2018 10:53 AM   MICROALBUR 2.1 (H) 03/27/2014 08:50 AM    Last diabetic Eye exam: No results found for: HMDIABEYEEXA  Last diabetic Foot exam: No  results found for: HMDIABFOOTEX   Lab Results  Component Value Date   CHOL 125 04/06/2020   HDL 30 (L) 04/06/2020   LDLCALC 72 04/06/2020   TRIG 149 04/06/2020   CHOLHDL 4.2 04/06/2020    Hepatic Function Latest Ref Rng & Units 04/06/2020 04/03/2019 03/13/2018  Total Protein 6.1 - 8.1 g/dL 6.6 6.5 7.0  Albumin 3.5 - 5.2 g/dL - 4.3 4.4  AST 10 - 35 U/L '20 18 20  ' ALT 9 - 46 U/L '23 21 25  ' Alk Phosphatase 39 - 117 U/L - 84 72  Total Bilirubin 0.2 - 1.2 mg/dL 0.7 0.8 0.6  Bilirubin, Direct 0.0 - 0.2 mg/dL 0.1 0.2 0.1    Lab Results  Component Value Date/Time   TSH 3.13 04/06/2020 08:51 AM   TSH 2.86 04/03/2019 08:35 AM    CBC Latest Ref Rng & Units 04/06/2020 04/03/2019 03/13/2018  WBC 3.8 - 10.8 Thousand/uL 7.1 7.3 7.9  Hemoglobin 13.2 - 17.1 g/dL 16.2 16.2 16.1  Hematocrit 38.5 - 50.0 % 48.2 48.4 48.3  Platelets 140 - 400 Thousand/uL 162 202.0 196.0    No results found for: VD25OH  Clinical ASCVD: No  The ASCVD Risk score Mikey Bussing DC Jr., et al., 2013) failed to calculate for the following reasons:   The valid total cholesterol range is 130 to 320 mg/dL    Depression screen [facility] 2/9 07/11/2019 03/13/2018 07/07/2016  Decreased Interest 0 0 0  Down, Depressed, Hopeless 0 0 0  PHQ - 2 Score 0  0 0     Social History   Tobacco Use  Smoking Status Never Smoker  Smokeless Tobacco Never Used   BP Readings from Last 3 Encounters:  04/06/20 140/78  07/11/19 138/72  04/03/19 130/68   Pulse Readings from Last 3 Encounters:  04/06/20 82  04/03/19 (!) 58  01/29/19 92   Wt Readings from Last 3 Encounters:  04/06/20 226 lb 6 oz (102.7 kg)  07/11/19 223 lb (101.2 kg)  04/03/19 226 lb 12.8 oz (102.9 kg)   BMI Readings from Last 3 Encounters:  04/06/20 36.54 kg/m  07/11/19 35.99 kg/m  04/03/19 36.61 kg/m    Assessment/Interventions: Review of patient past medical history, allergies, medications, health status, including review of consultants reports, laboratory and other test  data, was performed as part of comprehensive evaluation and provision of chronic care management services.   SDOH:  (Social Determinants of Health) assessments and interventions performed: No  SDOH Screenings   Alcohol Screen: Not on file  Depression (TMH9-6): Not on file  Financial Resource Strain: Not on file  Food Insecurity: Not on file  Housing: Not on file  Physical Activity: Not on file  Social Connections: Not on file  Stress: Not on file  Tobacco Use: Low Risk   . Smoking Tobacco Use: Never Smoker  . Smokeless Tobacco Use: Never Used  Transportation Needs: Not on file    [place]  No Known Allergies  Medications Reviewed Today    Reviewed by Viona Gilmore, [facility] (Pharmacist) on 08/27/20 at [place] List Status: <None>  Medication Order Taking? Sig Documenting Provider Last Dose Status Informant  aspirin 81 MG tablet [id number] Yes Take 81 mg by mouth daily. [provider] Taking Active   atenolol-chlorthalidone (TENORETIC) 50-25 MG tablet [id number] Yes Take 1 tablet by mouth daily. Laurey Morale, MD Taking Active   atorvastatin (LIPITOR) 40 MG tablet [id number] Yes Take 1 tablet (40 mg total) by mouth daily. Laurey Morale, MD Taking Active   latanoprost (XALATAN) 0.005 % ophthalmic solution [id number] Yes Place 1 drop into both eyes at bedtime. [provider] Taking Active   losartan (COZAAR) 100 MG tablet [id number] Yes Take 1 tablet (100 mg total) by mouth daily. Laurey Morale, MD Taking Active   potassium chloride (KLOR-CON) 10 MEQ tablet [id number] Yes Take 1 tablet (10 mEq total) by mouth daily. Laurey Morale, MD Taking Active   tamsulosin [facility]) 0.4 MG CAPS capsule [id number] Yes Take 0.4 mg by mouth daily after supper. [provider] Taking Active           Patient Active Problem List   Diagnosis Date Noted  . Abrasion, knee, left, initial encounter 01/29/2019  . Unilateral primary osteoarthritis, right knee 08/29/2017  .  Type 2 diabetes mellitus with diabetic neuropathy, unspecified ([place]) 03/10/2017  . Neuropathy, diabetic ([place]) 03/10/2017  . Malignant neoplasm of prostate ([place]) 07/07/2016  . OBESITY 01/08/2009  . EDEMA 01/08/2009  . ERECTILE DYSFUNCTION 04/24/2008  . BPH with urinary obstruction 04/24/2008  . Hyperlipidemia 04/13/2007  . Essential hypertension 04/13/2007  . Coronary atherosclerosis 04/13/2007    Immunization History  Administered Date(s) Administered  . Fluad Quad(high Dose 65+) 04/03/2019  . Influenza Split 03/06/2013  . Influenza Whole 04/13/2007, 03/06/2009  . Influenza, High Dose Seasonal PF 02/22/2016, 03/10/2017, 03/13/2018  . Influenza-Unspecified 11/13/2014, 03/06/2020  . PFIZER(Purple Top)SARS-COV-2 Vaccination 07/13/2019, 08/07/2019, 04/04/2020  . Pneumococcal Conjugate-13 02/22/2016  . Pneumococcal Polysaccharide-23 04/24/2008  . Td 02/02/2018  .  Tdap 08/13/2010    Conditions to be addressed/monitored:  Hypertension, Hyperlipidemia, Diabetes, BPH and neuropathy, edema  Care Plan : [place]  Updates made by Viona Gilmore, [place] since 09/03/2020 12:00 AM    Problem: Problem: Hypertension, Hyperlipidemia, Diabetes, BPH and neuropathy, edema     Long-Range Goal: Patient-Specific Goal   Start Date: 08/27/2020  Expected End Date: 08/27/2021  This Visit's Progress: On track  Priority: High  Note:   Current Barriers:  . Unable to independently monitor therapeutic efficacy  Pharmacist Clinical Goal(s):  Marland Kitchen Patient will achieve adherence to monitoring guidelines and medication adherence to achieve therapeutic efficacy through collaboration with PharmD and provider.   Interventions: . 1:1 collaboration with Laurey Morale, MD regarding development and update of comprehensive plan of care as evidenced by provider attestation and co-signature . Inter-disciplinary care team collaboration (see longitudinal plan of care) . Comprehensive medication review  performed; medication list updated in electronic medical record  Hypertension (BP goal <140/90) -Controlled -Current treatment:  atenolol/chlorthalidone 50-86m, 1 tablet once daily   losartan 1061m 1 tablet once daily  -Medications previously tried: none  -Current home readings: 140/73 (usually 135-140/70s) -Current dietary habits: mostly limiting salt intake -Current exercise habits: walking regularly -Denies hypotensive/hypertensive symptoms -Educated on Exercise goal of 150 minutes per week; Importance of home blood pressure monitoring; Proper BP monitoring technique; Symptoms of hypotension and importance of maintaining adequate hydration; -Counseled to monitor BP at home weekly, document, and provide log at future appointments -Counseled on diet and exercise extensively Recommended to continue current medication  Hyperlipidemia: (LDL goal < 70) -Not ideally controlled -Current treatment:  atorvastatin 4010m1 tablet once daily   aspirin 8m84m tablet once daily -Medications previously tried: none  -Current dietary patterns: did not discuss -Current exercise habits: patient is walking 3 days per week 30 minute intervals -Educated on Cholesterol goals;  Importance of limiting foods high in cholesterol; Exercise goal of 150 minutes per week; -Counseled on diet and exercise extensively Recommended to continue current medication  Diabetes (A1c goal <7%) -Controlled -Current medications: . No medications -Medications previously tried: none  -Current home glucose readings . fasting glucose: n/a . post prandial glucose: n/a -Denies hypoglycemic/hyperglycemic symptoms -Current meal patterns:  . breakfast: waffles, boiled eggs, toast, canadian bacon . Lunch/dinner: baked chicken, green beans, salad . snacks: n/a . drinks: n/a -Current exercise: walking 3 days a week -Educated on Exercise goal of 150 minutes per week; Carbohydrate counting and/or plate  method -Counseled to check feet daily and get yearly eye exams -Counseled on diet and exercise extensively  BPH/overactive bladder (Goal: minimize symptoms) -Controlled -Current treatment  . tamsulosin 0.4mg,28mcapsule once daily -Medications previously tried: oxybutynin, Myrbetriq -Recommended to continue current medication Counseled on taking 30 minutes after a meal  Nerve pain (Goal: minimize pain) -Controlled -Current treatment  . No medications -Medications previously tried: gabapentin (no longer needed)  -Recommended to continue as is  Hypokalemia (Goal: K 3.5-5) -Controlled -Current treatment  . Potassium chloride 10 mEq 1 tablet daily -Medications previously tried: none  -Recommended to continue current medication  Glaucoma (Goal: lower intraocular pressure) -Controlled -Current treatment  . Latanoprost 0.005% instil 1 drop in eye(s) before bedtime -Medications previously tried: none  -Recommended to continue current medication   Health Maintenance -Vaccine gaps: shingles -Current therapy:  . None -Educated on Cost vs benefit of each product must be carefully weighed by individual consumer -Patient is satisfied with current therapy and denies issues -Recommended to  continue as is  Patient Goals/Self-Care Activities . Patient will:  - check blood pressure weekly, document, and provide at future appointments target a minimum of 150 minutes of moderate intensity exercise weekly  Follow Up Plan: Telephone follow up appointment with care management team member scheduled for: 1 year       Medication Assistance: None required.  Patient affirms current coverage meets needs.  Patient's preferred pharmacy is:  [id number] [id number]: [phone]: [phone] [place]([facility] [id number]: [phone]: [phone] Uses pill box? No - plastic tray in cabinet  Pt endorses 99% compliance (very rarely)  We discussed: Current pharmacy is preferred with insurance plan and patient is satisfied with pharmacy services Patient decided to: Continue current medication management strategy  Care Plan and Follow Up Patient Decision:  Patient agrees to Care Plan and Follow-up.  Plan: Telephone follow up appointment with care management team member scheduled for:  1 year  BP assessment in 6 months  MJeni Salles PharmD BPromised LandPharmacist LCentreat [phone]

## 2020-09-03 NOTE — Patient Instructions (Addendum)
Hi Robert [name],  It was great to get to meet you over the telephone! Below is a summary of some of the topics we discussed. Keep up the good work with checking your blood pressure regularly and as we discussed, try to check after taking your medications to give them some time to kick in.  Please reach out to me if you have any questions or need anything before our follow up!  Best, Robert [name]  Robert [name], PharmD, [facility]  Visit Information  Goals Addressed   None    Patient Care Plan: CCM Pharmacy Care Plan    Problem Identified: Problem: Hypertension, Hyperlipidemia, Diabetes, BPH and neuropathy, edema     Long-Range Goal: Patient-Specific Goal   Start Date: 08/27/2020  Expected End Date: 08/27/2021  This Visit's Progress: On track  Priority: High  Note:   Current Barriers:  . Unable to independently monitor therapeutic efficacy  Pharmacist Clinical Goal(s):  Marland Kitchen Patient will achieve adherence to monitoring guidelines and medication adherence to achieve therapeutic efficacy through collaboration with PharmD and provider.   Interventions: . 1:1 collaboration with Robert Morale, MD regarding development and update of comprehensive plan of care as evidenced by provider attestation and co-signature . Inter-disciplinary care team collaboration (see longitudinal plan of care) . Comprehensive medication review performed; medication list updated in electronic medical record  Hypertension (BP goal <140/90) -Controlled -Current treatment:  atenolol/chlorthalidone 50-25mg , 1 tablet once daily   losartan 100mg , 1 tablet once daily  -Medications previously tried: none  -Current home readings: 140/73 (usually 135-140/70s) -Current dietary habits: mostly limiting salt intake -Current exercise habits: walking regularly -Denies hypotensive/hypertensive symptoms -Educated on Exercise goal of 150 minutes per week; Importance  of home blood pressure monitoring; Proper BP monitoring technique; Symptoms of hypotension and importance of maintaining adequate hydration; -Counseled to monitor BP at home weekly, document, and provide log at future appointments -Counseled on diet and exercise extensively Recommended to continue current medication  Hyperlipidemia: (LDL goal < 70) -Not ideally controlled -Current treatment:  atorvastatin 40mg , 1 tablet once daily   aspirin 81mg , 1 tablet once daily -Medications previously tried: none  -Current dietary patterns: did not discuss -Current exercise habits: patient is walking 3 days per week 30 minute intervals -Educated on Cholesterol goals;  Importance of limiting foods high in cholesterol; Exercise goal of 150 minutes per week; -Counseled on diet and exercise extensively Recommended to continue current medication  Diabetes (A1c goal <7%) -Controlled -Current medications: . No medications -Medications previously tried: none  -Current home glucose readings . fasting glucose: n/a . post prandial glucose: n/a -Denies hypoglycemic/hyperglycemic symptoms -Current meal patterns:  . breakfast: waffles, boiled eggs, toast, canadian bacon . Lunch/dinner: baked chicken, green beans, salad . snacks: n/a . drinks: n/a -Current exercise: walking 3 days a week -Educated on Exercise goal of 150 minutes per week; Carbohydrate counting and/or plate method -Counseled to check feet daily and get yearly eye exams -Counseled on diet and exercise extensively  BPH/overactive bladder (Goal: minimize symptoms) -Controlled -Current treatment  . tamsulosin 0.4mg , 1 capsule once daily -Medications previously tried: oxybutynin, Myrbetriq -Recommended to continue current medication Counseled on taking 30 minutes after a meal  Nerve pain (Goal: minimize pain) -Controlled -Current treatment  . No medications -Medications previously tried: gabapentin (no longer needed)   -Recommended to continue as is  Hypokalemia (Goal: K 3.5-5) -Controlled -Current treatment  . Potassium chloride 10 mEq 1 tablet daily -Medications previously tried: none  -Recommended to  continue current medication  Glaucoma (Goal: lower intraocular pressure) -Controlled -Current treatment  . Latanoprost 0.005% instil 1 drop in eye(s) before bedtime -Medications previously tried: none  -Recommended to continue current medication   Health Maintenance -Vaccine gaps: shingles -Current therapy:  . None -Educated on Cost vs benefit of each product must be carefully weighed by individual consumer -Patient is satisfied with current therapy and denies issues -Recommended to continue as is  Patient Goals/Self-Care Activities . Patient will:  - check blood pressure weekly, document, and provide at future appointments target a minimum of 150 minutes of moderate intensity exercise weekly  Follow Up Plan: Telephone follow up appointment with care management team member scheduled for: 1 year       Patient verbalizes understanding of instructions provided today and agrees to view in [place].  Telephone follow up appointment with pharmacy team member scheduled for: 1 year  Robert [name], Robert [name]  Dyslipidemia Dyslipidemia is an imbalance of waxy, fat-like substances (lipids) in the blood. The body needs lipids in small amounts. Dyslipidemia often involves a high level of cholesterol or triglycerides, which are types of lipids. Common forms of dyslipidemia include:  High levels of LDL cholesterol. LDL is the type of cholesterol that causes fatty deposits (plaques) to build up in the blood vessels that carry blood away from your heart (arteries).  Low levels of HDL cholesterol. HDL cholesterol is the type of cholesterol that protects against heart disease. High levels of HDL remove the LDL buildup from arteries.  High levels of triglycerides. Triglycerides are a fatty substance in the  blood that is linked to a buildup of plaques in the arteries. What are the causes? Primary dyslipidemia is caused by changes (mutations) in genes that are passed down through families (inherited). These mutations cause several types of dyslipidemia. Secondary dyslipidemia is caused by lifestyle choices and diseases that lead to dyslipidemia, such as:  Eating a diet that is high in animal fat.  Not getting enough exercise.  Having diabetes, kidney disease, liver disease, or thyroid disease.  Drinking large amounts of alcohol.  Using certain medicines. What increases the risk? You are more likely to develop this condition if you are an older man or if you are a woman who has gone through menopause. Other risk factors include:  Having a family history of dyslipidemia.  Taking certain medicines, including birth control pills, steroids, some diuretics, and beta-blockers.  Smoking cigarettes.  Eating a high-fat diet.  Having certain medical conditions such as diabetes, polycystic ovary syndrome (PCOS), kidney disease, liver disease, or hypothyroidism.  Not exercising regularly.  Being overweight or obese with too much belly fat. What are the signs or symptoms? In most cases, dyslipidemia does not usually cause any symptoms. In severe cases, very high lipid levels can cause:  Fatty bumps under the skin (xanthomas).  White or gray ring around the black [name] (pupil) of the eye. Very high triglyceride levels can cause inflammation of the pancreas (pancreatitis). How is this diagnosed? Your health care provider may diagnose dyslipidemia based on a routine blood test (fasting blood test). Because most people do not have symptoms of the condition, this blood testing (lipid profile) is done on adults age 67 and older and is repeated every 5 years. This test checks:  Total cholesterol. This measures the total amount of cholesterol in your blood, including LDL cholesterol, HDL cholesterol,  and triglycerides. A healthy number is below 200.  LDL cholesterol. The target number for LDL cholesterol is different  for each person, depending on individual risk factors. Ask your health care provider what your LDL cholesterol should be.  HDL cholesterol. An HDL level of 60 or higher is best because it helps to protect against heart disease. A number below 80 for men or below 58 for women increases the risk for heart disease.  Triglycerides. A healthy triglyceride number is below 150. If your lipid profile is abnormal, your health care provider may do other blood tests.   How is this treated? Treatment depends on the type of dyslipidemia that you have and your other risk factors for heart disease and stroke. Your health care provider will have a target range for your lipid levels based on this information. For many people, this condition may be treated by lifestyle changes, such as diet and exercise. Your health care provider may recommend that you:  Get regular exercise.  Make changes to your diet.  Quit smoking if you smoke. If diet changes and exercise do not help you reach your goals, your health care provider may also prescribe medicine to lower lipids. The most commonly prescribed type of medicine lowers your LDL cholesterol (statin drug). If you have a high triglyceride level, your provider may prescribe another type of drug (fibrate) or an omega-3 fish oil supplement, or both. Follow these instructions at home: Eating and drinking  Follow instructions from your health care provider or dietitian about eating or drinking restrictions.  Eat a healthy diet as told by your health care provider. This can help you reach and maintain a healthy weight, lower your LDL cholesterol, and raise your HDL cholesterol. This may include: ? Limiting your calories, if you are overweight. ? Eating more fruits, vegetables, whole grains, fish, and lean meats. ? Limiting saturated fat, trans fat, and  cholesterol.  If you drink alcohol: ? Limit how much you use. ? Be aware of how much alcohol is in your drink. In the [place] one drink equals one 12 oz bottle of beer (355 mL), one 5 oz glass of wine (148 mL), or one 1 oz glass of hard liquor (44 mL).  Do not drink alcohol if: ? Your health care provider tells you not to drink. ? You are pregnant, may be pregnant, or are planning to become pregnant. Activity  Get regular exercise. Start an exercise and strength training program as told by your health care provider. Ask your health care provider what activities are safe for you. Your health care provider may recommend: ? 30 minutes of aerobic activity 4-6 days a week. Brisk walking is an example of aerobic activity. ? Strength training 2 days a week. General instructions  Do not use any products that contain nicotine or tobacco, such as cigarettes, e-cigarettes, and chewing tobacco. If you need help quitting, ask your health care provider.  Take over-the-counter and prescription medicines only as told by your health care provider. This includes supplements.  Keep all follow-up visits as told by your health care provider.   Contact a health care provider if:  You are: ? Having trouble sticking to your exercise or diet plan. ? Struggling to quit smoking or control your use of alcohol. Summary  Dyslipidemia often involves a high level of cholesterol or triglycerides, which are types of lipids.  Treatment depends on the type of dyslipidemia that you have and your other risk factors for heart disease and stroke.  For many people, treatment starts with lifestyle changes, such as diet and exercise.  Your health care provider  may prescribe medicine to lower lipids. This information is not intended to replace advice given to you by your health care provider. Make sure you discuss any questions you have with your health care provider. Document Revised: 01/15/2018 Document Reviewed:  12/22/2017 Elsevier Patient Education  [place].

## 2020-09-29 ENCOUNTER — Ambulatory Visit: Payer: PPO

## 2020-10-14 DIAGNOSIS — H40053 Ocular hypertension, bilateral: Secondary | ICD-10-CM | POA: Diagnosis not present

## 2020-10-15 ENCOUNTER — Telehealth: Payer: Self-pay | Admitting: Pharmacist

## 2020-10-15 NOTE — Chronic Care Management (AMB) (Signed)
    Chronic Care Management Pharmacy Assistant   Name: Robert [name]  MRN: [id number] DOB: 01-09-1941  Reason for Encounter: Disease State   Conditions to be addressed/monitored: HLD  Recent office visits:  None  Recent consult visits:  None  Hospital visits:  None in previous 6 months  Medications: Outpatient Encounter Medications as of 10/15/2020  Medication Sig  . aspirin 81 MG tablet Take 81 mg by mouth daily.  Marland Kitchen atenolol-chlorthalidone (TENORETIC) 50-25 MG tablet Take 1 tablet by mouth daily.  Marland Kitchen atorvastatin (LIPITOR) 40 MG tablet Take 1 tablet (40 mg total) by mouth daily.  Marland Kitchen latanoprost (XALATAN) 0.005 % ophthalmic solution Place 1 drop into both eyes at bedtime.  Marland Kitchen losartan (COZAAR) 100 MG tablet Take 1 tablet (100 mg total) by mouth daily.  . potassium chloride (KLOR-CON) 10 MEQ tablet Take 1 tablet (10 mEq total) by mouth daily.  . tamsulosin (FLOMAX) 0.4 MG CAPS capsule Take 0.4 mg by mouth daily after supper.   No facility-administered encounter medications on file as of 10/15/2020.   10/21/2020 Name: Robert [name] MRN: [id number] DOB: 27-Feb-1941 Robert [name] is a 80 y.o. year old male who is a primary care patient of Laurey Morale, MD.  Comprehensive medication review performed; Spoke to patient regarding cholesterol  Lipid Panel    Component Value Date/Time   CHOL 125 04/06/2020 0851   TRIG 149 04/06/2020 0851   HDL 30 (L) 04/06/2020 0851   LDLCALC 72 04/06/2020 0851    10-year ASCVD risk score: The ASCVD Risk score Mikey Bussing DC Jr., et al., 2013) failed to calculate for the following reasons:   The systolic blood pressure is missing   The valid total cholesterol range is 130 to 320 mg/dL . Current antihyperlipidemic regimen:   Atorvastatin 40 mg, 1 tablet once daily   Aspirin 81 mg, 1 tablet once daily . Previous antihyperlipidemic medications tried: None . ASCVD risk enhancing conditions: HTN . What recent interventions/DTPs have been made  by any provider to improve Cholesterol control since last CPP Visit: None . Any recent hospitalizations or ED visits since last visit with CPP? No . What diet changes have been made to improve Cholesterol?  o Increased water and lower fatty foods . What exercise is being done to improve Cholesterol?  o None  Adherence Review: Does the patient have >5 day gap between last estimated fill dates? No   I spoke with the patient and discussed medication adherence. The patient states that he has been doing well. He is not experiencing any side effects from his current medication. He states that he has increased his water intake and watched his fatty foods. There have been no changes to his current medications. There have been no urgent care or emergency department visit since his last CPP or PCP visit. He denies any issues with his current pharmacy  Star Rating Drugs:  Dispensed Quantity Pharmacy  Atorvastatin 40 mg 01.26.2022 [address] [facility]  Losartan 100 mg 05.17.2022 [address]   Arlis Porta ([place]) [place], [place] [phone]

## 2020-10-16 ENCOUNTER — Telehealth: Payer: Self-pay | Admitting: Family Medicine

## 2020-10-16 NOTE — Telephone Encounter (Signed)
Left message for patient to call back and schedule Medicare Annual Wellness Visit (AWV) either virtually or in office.   Last AWV 07/11/19  please schedule at anytime with [facility] Nurse Health Advisor 1 or 2   This should be a 45 minute visit. 

## 2020-10-16 NOTE — Telephone Encounter (Signed)
Patient wants a call back around the first of June to schedule his AWV. He has a lot going on this month.

## 2020-10-20 ENCOUNTER — Ambulatory Visit (INDEPENDENT_AMBULATORY_CARE_PROVIDER_SITE_OTHER): Payer: PPO

## 2020-10-20 DIAGNOSIS — Z0000 Encounter for general adult medical examination without abnormal findings: Secondary | ICD-10-CM

## 2020-10-20 NOTE — Patient Instructions (Signed)
Robert [name] , Thank you for taking time to come for your Medicare Wellness Visit. I appreciate your ongoing commitment to your health goals. Please review the following plan we discussed and let me know if I can assist you in the future.   Screening recommendations/referrals: Colonoscopy: no longer required  Recommended yearly ophthalmology/optometry visit for glaucoma screening and checkup Recommended yearly dental visit for hygiene and checkup  Vaccinations: Influenza vaccine: current due in fall 2022 Pneumococcal vaccine: completed series  Tdap vaccine: current due 2029 Shingles vaccine: will obtain local pharmacy    Advanced directives: will provide copies   Conditions/risks identified: none   Next appointment: none   Preventive Care 78 Years and Older, Male Preventive care refers to lifestyle choices and visits with your health care provider that can promote health and wellness. What does preventive care include?  A yearly physical exam. This is also called an annual well check.  Dental exams once or twice a year.  Routine eye exams. Ask your health care provider how often you should have your eyes checked.  Personal lifestyle choices, including:  Daily care of your teeth and gums.  Regular physical activity.  Eating a healthy diet.  Avoiding tobacco and drug use.  Limiting alcohol use.  Practicing safe sex.  Taking low doses of aspirin every day.  Taking vitamin and mineral supplements as recommended by your health care provider. What happens during an annual well check? The services and screenings done by your health care provider during your annual well check will depend on your age, overall health, lifestyle risk factors, and family history of disease. Counseling  Your health care provider may ask you questions about your:  Alcohol use.  Tobacco use.  Drug use.  Emotional well-being.  Home and relationship well-being.  Sexual activity.  Eating  habits.  History of falls.  Memory and ability to understand (cognition).  Work and work Statistician. Screening  You may have the following tests or measurements:  Height, weight, and BMI.  Blood pressure.  Lipid and cholesterol levels. These may be checked every 5 years, or more frequently if you are over 78 years old.  Skin check.  Lung cancer screening. You may have this screening every year starting at age 78 if you have a 30-pack-year history of smoking and currently smoke or have quit within the past 15 years.  Fecal occult blood test (FOBT) of the stool. You may have this test every year starting at age 78.  Flexible sigmoidoscopy or colonoscopy. You may have a sigmoidoscopy every 5 years or a colonoscopy every 10 years starting at age 78.  Prostate cancer screening. Recommendations will vary depending on your family history and other risks.  Hepatitis C blood test.  Hepatitis B blood test.  Sexually transmitted disease (STD) testing.  Diabetes screening. This is done by checking your blood sugar (glucose) after you have not eaten for a while (fasting). You may have this done every 1-3 years.  Abdominal aortic aneurysm (AAA) screening. You may need this if you are a current or former smoker.  Osteoporosis. You may be screened starting at age 78 if you are at high risk. Talk with your health care provider about your test results, treatment options, and if necessary, the need for more tests. Vaccines  Your health care provider may recommend certain vaccines, such as:  Influenza vaccine. This is recommended every year.  Tetanus, diphtheria, and acellular pertussis (Tdap, Td) vaccine. You may need a Td booster every 10  years.  Zoster vaccine. You may need this after age 78.  Pneumococcal 13-valent conjugate (PCV13) vaccine. One dose is recommended after age 78.  Pneumococcal polysaccharide (PPSV23) vaccine. One dose is recommended after age 78. Talk to your health  care provider about which screenings and vaccines you need and how often you need them. This information is not intended to replace advice given to you by your health care provider. Make sure you discuss any questions you have with your health care provider. Document Released: 06/19/2015 Document Revised: 02/10/2016 Document Reviewed: 03/24/2015 Elsevier Interactive Patient Education  2017 [place] Prevention in the Home Falls can cause injuries. They can happen to people of all ages. There are many things you can do to make your home safe and to help prevent falls. What can I do on the outside of my home?  Regularly fix the edges of walkways and driveways and fix any cracks.  Remove anything that might make you trip as you walk through a door, such as a raised step or threshold.  Trim any bushes or trees on the path to your home.  Use bright outdoor lighting.  Clear any walking paths of anything that might make someone trip, such as rocks or tools.  Regularly check to see if handrails are loose or broken. Make sure that both sides of any steps have handrails.  Any raised decks and porches should have guardrails on the edges.  Have any leaves, snow, or ice cleared regularly.  Use sand or salt on walking paths during winter.  Clean up any spills in your garage right away. This includes oil or grease spills. What can I do in the bathroom?  Use night lights.  Install grab bars by the toilet and in the tub and shower. Do not use towel bars as grab bars.  Use non-skid mats or decals in the tub or shower.  If you need to sit down in the shower, use a plastic, non-slip stool.  Keep the floor dry. Clean up any water that spills on the floor as soon as it happens.  Remove soap buildup in the tub or shower regularly.  Attach bath mats securely with double-sided non-slip rug tape.  Do not have throw rugs and other things on the floor that can make you trip. What can I do  in the bedroom?  Use night lights.  Make sure that you have a light by your bed that is easy to reach.  Do not use any sheets or blankets that are too big for your bed. They should not hang down onto the floor.  Have a firm chair that has side arms. You can use this for support while you get dressed.  Do not have throw rugs and other things on the floor that can make you trip. What can I do in the kitchen?  Clean up any spills right away.  Avoid walking on wet floors.  Keep items that you use a lot in easy-to-reach places.  If you need to reach something above you, use a strong step stool that has a grab bar.  Keep electrical cords out of the way.  Do not use floor polish or wax that makes floors slippery. If you must use wax, use non-skid floor wax.  Do not have throw rugs and other things on the floor that can make you trip. What can I do with my stairs?  Do not leave any items on the stairs.  Make sure that  there are handrails on both sides of the stairs and use them. Fix handrails that are broken or loose. Make sure that handrails are as long as the stairways.  Check any carpeting to make sure that it is firmly attached to the stairs. Fix any carpet that is loose or worn.  Avoid having throw rugs at the top or bottom of the stairs. If you do have throw rugs, attach them to the floor with carpet tape.  Make sure that you have a light switch at the top of the stairs and the bottom of the stairs. If you do not have them, ask someone to add them for you. What else can I do to help prevent falls?  Wear shoes that:  Do not have high heels.  Have rubber bottoms.  Are comfortable and fit you well.  Are closed at the toe. Do not wear sandals.  If you use a stepladder:  Make sure that it is fully opened. Do not climb a closed stepladder.  Make sure that both sides of the stepladder are locked into place.  Ask someone to hold it for you, if possible.  Clearly mark  and make sure that you can see:  Any grab bars or handrails.  First and last steps.  Where the edge of each step is.  Use tools that help you move around (mobility aids) if they are needed. These include:  Canes.  Walkers.  Scooters.  Crutches.  Turn on the lights when you go into a dark area. Replace any light bulbs as soon as they burn out.  Set up your furniture so you have a clear path. Avoid moving your furniture around.  If any of your floors are uneven, fix them.  If there are any pets around you, be aware of where they are.  Review your medicines with your doctor. Some medicines can make you feel dizzy. This can increase your chance of falling. Ask your doctor what other things that you can do to help prevent falls. This information is not intended to replace advice given to you by your health care provider. Make sure you discuss any questions you have with your health care provider. Document Released: 03/19/2009 Document Revised: 10/29/2015 Document Reviewed: 06/27/2014 Elsevier Interactive Patient Education  2017 [employer].

## 2020-10-20 NOTE — Progress Notes (Signed)
Subjective:   Robert [name] is a 80 y.o. male who presents for an Initial Medicare Annual Wellness Visit.  I connected with Robert [name] today by telephone and verified that I am speaking with the correct person using two identifiers. Location patient: home Location provider: work Persons participating in the virtual visit: patient, provider.   I discussed the limitations, risks, security and privacy concerns of performing an evaluation and management service by telephone and the availability of in person appointments. I also discussed with the patient that there may be a patient responsible charge related to this service. The patient expressed understanding and verbally consented to this telephonic visit.    Interactive audio and video telecommunications were attempted between this provider and patient, however failed, due to patient having technical difficulties OR patient did not have access to video capability.  We continued and completed visit with audio only.     Review of Systems    n/a       Objective:    There were no vitals filed for this visit. There is no height or weight on file to calculate BMI.  Advanced Directives 07/11/2019 01/09/2019 03/13/2018 10/20/2016  Does Patient Have a Medical Advance Directive? No No No No  Would patient like information on creating a medical advance directive? No - Patient declined No - Patient declined - No - Patient declined    Current Medications (verified) Outpatient Encounter Medications as of 10/20/2020  Medication Sig  . aspirin 81 MG tablet Take 81 mg by mouth daily.  Marland Kitchen atenolol-chlorthalidone (TENORETIC) 50-25 MG tablet Take 1 tablet by mouth daily.  Marland Kitchen atorvastatin (LIPITOR) 40 MG tablet Take 1 tablet (40 mg total) by mouth daily.  Marland Kitchen latanoprost (XALATAN) 0.005 % ophthalmic solution Place 1 drop into both eyes at bedtime.  Marland Kitchen losartan (COZAAR) 100 MG tablet Take 1 tablet (100 mg total) by mouth daily.  . potassium chloride  (KLOR-CON) 10 MEQ tablet Take 1 tablet (10 mEq total) by mouth daily.  . tamsulosin (FLOMAX) 0.4 MG CAPS capsule Take 0.4 mg by mouth daily after supper.   No facility-administered encounter medications on file as of 10/20/2020.    Allergies (verified) Patient has no known allergies.   History: Past Medical History:  Diagnosis Date  . CAD (coronary artery disease)    per pt cardiologist dr Johnsie Cancel, last office visit 02-05-2010, currently followed by pcp , dr fry  . Diverticulosis of colon   . ED (erectile dysfunction) of non-organic origin   . History of colon polyps    02/ 2009 benign  . Hyperlipidemia   . Hypertension   . Nodular prostate with lower urinary tract symptoms   . Prostate cancer [facility]) urologist-  dr wrenn/  oncologist-  dr Tammi Klippel   dx 06-15-2016,  Stage T2a, Gleason 3+3,  PSA 4.64,  vol 23.4cc  . S/P CABG x 4 05/02/2005   LIMA to LAD,  radial graft to CFx marginal,  SVG to diagonal and RCA  . Wears glasses    Past Surgical History:  Procedure Laterality Date  . CARDIAC CATHETERIZATION  04-29-2005  dr w. Albertine Patricia   severe 2V CAD-- 90% LAD diagonal, 80% CFx ostium,  50% pRCA  . COLONOSCOPY  08/03/2007   per Dr. Fuller Plan, benign polyp, he declines to have any more   . CORONARY ARTERY BYPASS GRAFT  05-02-2005  dr Lucianne Lei tright   LIMA to LAD,  radial graft to CFx marginal,  SVG to diagonal and RCA  .  EXICIOSN MASS INDEX FINGER Right 07/2015   per pt benign  . PROSTATE BIOPSY  06/15/2016  . RADIOACTIVE SEED IMPLANT [name] 10/20/2016   Procedure: RADIOACTIVE SEED IMPLANT/BRACHYTHERAPY IMPLANT, 71 seeds implanted, no seeds found in bladder;  Surgeon: Irine Seal, MD;  Location: [facility];  Service: Urology;  Laterality: [name];   Family History  Problem Relation Age of Onset  . Sudden death Other   . Cancer Maternal Aunt        ovarian  . Cancer Maternal Uncle        prostate  . Cancer Maternal Grandmother        breast  . Cancer Maternal Grandfather         prostate  . Cancer Maternal Uncle        prostate  . Cancer Maternal Uncle        prostate  . Cancer Maternal Uncle        prostate   Social History   Socioeconomic History  . Marital status: Married    Spouse name: Not on file  . Number of children: 2  . Years of education: 4 years college  . Highest education level: Bachelor's degree (e.g., BA, AB, BS)  Occupational History  . Occupation: retired  Tobacco Use  . Smoking status: Never Smoker  . Smokeless tobacco: Never Used  Substance and Sexual Activity  . Alcohol use: No    Alcohol/week: 0.0 standard drinks  . Drug use: No  . Sexual activity: Not on file  Other Topics Concern  . Not on file  Social History Narrative   HH 2   Married   2 children locally   3 grandchildren   Social Determinants of Health   Financial Resource Strain: Not on file  Food Insecurity: Not on file  Transportation Needs: Not on file  Physical Activity: Not on file  Stress: Not on file  Social Connections: Not on file    Tobacco Counseling Counseling given: Not Answered   Clinical Intake:                 Diabetic?no         Activities of Daily Living No flowsheet data found.  Patient Care Team: Laurey Morale, MD as PCP - General Kipp Brood Mariam Dollar, [facility] as Pharmacist (Pharmacist)  Indicate any recent Medical Services you may have received from other than [facility] providers in the past year (date may be approximate).     Assessment:   This is a routine wellness examination for Robert [name].  Hearing/Vision screen No exam data present  Dietary issues and exercise activities discussed:    Goals Addressed   None    Depression Screen PHQ 2/9 Scores 07/11/2019 03/13/2018 07/07/2016 07/07/2016 02/22/2016 01/06/2015  PHQ - 2 Score 0 0 0 0 0 0    Fall Risk Fall Risk  07/11/2019 03/13/2018 07/07/2016 07/07/2016 02/22/2016  Falls in the past year? 1 No No No No  Number falls in past yr: 0 - - - -  Injury with Fall? 1 - - - -  Risk  for fall due to : History of fall(s);Medication side effect - - - -  Follow up Falls evaluation completed;Education provided;Falls prevention discussed - - - -    FALL RISK PREVENTION PERTAINING TO THE HOME:  Any stairs in or around the home? Yes  If so, are there any without handrails? No  Home free of loose throw rugs in walkways, pet beds, electrical cords, etc? Yes  Adequate lighting in your home to reduce risk of falls? Yes   ASSISTIVE DEVICES UTILIZED TO PREVENT FALLS:  Life alert? No  Use of a cane, walker or w/c? No  Grab bars in the bathroom? Yes  Shower chair or bench in shower? Yes  Elevated toilet seat or a handicapped toilet? Yes   T Cognitive Function: Normal cognitive status assessed by direct observation by this Nurse Health Advisor. No abnormalities found.   MMSE - Mini Mental State Exam 03/13/2018  Not completed: (No Data)     6CIT Screen 07/11/2019  What Year? 0 points  What month? 0 points  What time? 0 points  Count back from 20 0 points  Months in reverse 0 points  Repeat phrase 0 points  Total Score 0    Immunizations Immunization History  Administered Date(s) Administered  . Fluad Quad(high Dose 65+) 04/03/2019  . Influenza Split 03/06/2013  . Influenza Whole 04/13/2007, 03/06/2009  . Influenza, High Dose Seasonal PF 02/22/2016, 03/10/2017, 03/13/2018  . Influenza-Unspecified 11/13/2014, 03/06/2020  . PFIZER(Purple Top)SARS-COV-2 Vaccination 07/13/2019, 08/07/2019, 04/04/2020  . Pneumococcal Conjugate-13 02/22/2016  . Pneumococcal Polysaccharide-23 04/24/2008  . Td 02/02/2018  . Tdap 08/13/2010    TDAP status: Due, Education has been provided regarding the importance of this vaccine. Advised may receive this vaccine at local pharmacy or Health Dept. Aware to provide a copy of the vaccination record if obtained from local pharmacy or Health Dept. Verbalized acceptance and understanding.  Flu Vaccine status: Up to date  Pneumococcal vaccine  status: Up to date  Covid-19 vaccine status: Completed vaccines  Qualifies for Shingles Vaccine? Yes   Zostavax completed No   Shingrix Completed?: No.    Education has been provided regarding the importance of this vaccine. Patient has been advised to call insurance company to determine out of pocket expense if they have not yet received this vaccine. Advised may also receive vaccine at local pharmacy or Health Dept. Verbalized acceptance and understanding.  Screening Tests Health Maintenance  Topic Date Due  . Hepatitis C Screening  Never done  . FOOT EXAM  03/14/2019  . OPHTHALMOLOGY EXAM  01/07/2020  . COVID-19 Vaccine (4 - Booster for [place] series) 07/05/2020  . HEMOGLOBIN A1C  10/04/2020  . INFLUENZA VACCINE  01/04/2021  . TETANUS/TDAP  02/03/2028  . PNA vac Low Risk Adult  Completed  . HPV VACCINES  Aged Out    Health Maintenance  Health Maintenance Due  Topic Date Due  . Hepatitis C Screening  Never done  . FOOT EXAM  03/14/2019  . OPHTHALMOLOGY EXAM  01/07/2020  . COVID-19 Vaccine (4 - Booster for [place] series) 07/05/2020  . HEMOGLOBIN A1C  10/04/2020    Colorectal cancer screening: No longer required.   Lung Cancer Screening: (Low Dose CT Chest recommended if Age 35-80 years, 30 pack-year currently smoking OR have quit w/in 15years.) does not qualify.   Lung Cancer Screening Referral: n/a  Additional Screening:  Hepatitis C Screening: does qualify  Vision Screening: Recommended annual ophthalmology exams for early detection of glaucoma and other disorders of the eye. Is the patient up to date with their annual eye exam?  Yes  Who is the provider or what is the name of the office in which the patient attends annual eye exams? Dr.Scott If pt is not established with a provider, would they like to be referred to a provider to establish care? No .   Dental Screening: Recommended annual dental exams for proper oral hygiene  Community Resource Referral / Chronic  Care Management: CRR required this visit?  No   CCM required this visit?  No      Plan:     I have personally reviewed and noted the following in the patient's chart:   . Medical and social history . Use of alcohol, tobacco or illicit drugs  . Current medications and supplements including opioid prescriptions. Patient is not currently taking opioid prescriptions. . Functional ability and status . Nutritional status . Physical activity . Advanced directives . List of other physicians . Hospitalizations, surgeries, and ER visits in previous 12 months . Vitals . Screenings to include cognitive, depression, and falls . Referrals and appointments  In addition, I have reviewed and discussed with patient certain preventive protocols, quality metrics, and best practice recommendations. A written personalized care plan for preventive services as well as general preventive health recommendations were provided to patient.     Randel Pigg, LPN   02/25/1938   Nurse Notes: none

## 2020-11-09 DIAGNOSIS — Z8546 Personal history of malignant neoplasm of prostate: Secondary | ICD-10-CM | POA: Diagnosis not present

## 2020-11-09 DIAGNOSIS — N403 Nodular prostate with lower urinary tract symptoms: Secondary | ICD-10-CM | POA: Diagnosis not present

## 2020-11-09 DIAGNOSIS — R351 Nocturia: Secondary | ICD-10-CM | POA: Diagnosis not present

## 2020-11-11 ENCOUNTER — Encounter: Payer: Self-pay | Admitting: Family Medicine

## 2020-11-11 NOTE — Telephone Encounter (Signed)
Set up an IN person OV

## 2020-11-12 ENCOUNTER — Encounter: Payer: Self-pay | Admitting: Family Medicine

## 2020-11-12 ENCOUNTER — Ambulatory Visit (INDEPENDENT_AMBULATORY_CARE_PROVIDER_SITE_OTHER): Payer: PPO | Admitting: Family Medicine

## 2020-11-12 ENCOUNTER — Other Ambulatory Visit: Payer: Self-pay

## 2020-11-12 VITALS — BP 158/70 | HR 54 | Temp 98.3°F | Wt 223.0 lb

## 2020-11-12 DIAGNOSIS — K5903 Drug induced constipation: Secondary | ICD-10-CM | POA: Diagnosis not present

## 2020-11-12 NOTE — Progress Notes (Signed)
   Subjective:    Patient ID: Robert [name], male    DOB: 07-08-1940, 80 y.o.   MRN: [id number]  HPI Here for bowel issues. For years his bowels have moved regularly, but one week ago he had 2 loose stools for some reason. He then took 2 doses of Imodium, and he did not pass another stool for 3 days. He then passes a small stool (with a lot of straining), and now 2 more days have passed without a BM. He feels fine in genera. No bloating or distention. No cramping or pain. No nausea or fever. He drinks plenty of water daily.    Review of Systems  Constitutional: Negative.   Respiratory: Negative.    Cardiovascular: Negative.   Gastrointestinal:  Positive for constipation. Negative for abdominal distention, abdominal pain, anal bleeding, blood in stool, diarrhea, nausea, rectal pain and vomiting.  Genitourinary: Negative.       Objective:   Physical Exam Constitutional:      General: He is not in acute distress.    Appearance: Normal appearance.  Cardiovascular:     Rate and Rhythm: Normal rate and regular rhythm.     Pulses: Normal pulses.     Heart sounds: Normal heart sounds.  Pulmonary:     Effort: Pulmonary effort is normal.     Breath sounds: Normal breath sounds.  Abdominal:     General: Abdomen is flat. Bowel sounds are normal. There is no distension.     Palpations: Abdomen is soft. There is no mass.     Tenderness: There is no abdominal tenderness. There is no guarding or rebound.     Hernia: No hernia is present.  Neurological:     Mental Status: He is alert.          Assessment & Plan:  Constipation. This may be a prolonged effect of the Imodium. He will take Miralax with a full glass of water BID for a few days. Recheck as needed.  [name], MD

## 2020-11-19 ENCOUNTER — Telehealth: Payer: Self-pay | Admitting: Pharmacist

## 2020-11-19 NOTE — Chronic Care Management (AMB) (Signed)
Chronic Care Management Pharmacy Assistant   Name: Robert [name]  MRN: [id number] DOB: 1940/09/02  Reason for Encounter: Disease State   Conditions to be addressed/monitored: HTN  Recent office visits:  06.19.2022 Laurey Morale, MD patient seen for acute visit. 05.17.2022 Randel Pigg, LPN Medicare Wellness Exam  Recent consult visits:  None  Hospital visits:  None in previous 6 months  Medications: Outpatient Encounter Medications as of 11/19/2020  Medication Sig   aspirin 81 MG tablet Take 81 mg by mouth daily.   atenolol-chlorthalidone (TENORETIC) 50-25 MG tablet Take 1 tablet by mouth daily.   atorvastatin (LIPITOR) 40 MG tablet Take 1 tablet (40 mg total) by mouth daily.   latanoprost (XALATAN) 0.005 % ophthalmic solution Place 1 drop into both eyes at bedtime.   losartan (COZAAR) 100 MG tablet Take 1 tablet (100 mg total) by mouth daily.   potassium chloride (KLOR-CON) 10 MEQ tablet Take 1 tablet (10 mEq total) by mouth daily.   tamsulosin (FLOMAX) 0.4 MG CAPS capsule Take 0.4 mg by mouth daily after supper.   No facility-administered encounter medications on file as of 11/19/2020.   Reviewed chart prior to disease state call. Spoke with patient regarding BP  Recent Office Vitals: BP Readings from Last 3 Encounters:  11/12/20 (!) 158/70  04/06/20 140/78  07/11/19 138/72   Pulse Readings from Last 3 Encounters:  11/12/20 (!) 54  04/06/20 82  04/03/19 (!) 58    Wt Readings from Last 3 Encounters:  11/12/20 223 lb (101.2 kg)  04/06/20 226 lb 6 oz (102.7 kg)  07/11/19 223 lb (101.2 kg)     Kidney Function Lab Results  Component Value Date/Time   CREATININE 0.91 04/06/2020 08:51 AM   CREATININE 0.94 04/03/2019 08:35 AM   CREATININE 0.87 03/13/2018 10:53 AM   GFR 77.93 04/03/2019 08:35 AM   GFRNONAA 81 04/06/2020 08:51 AM   GFRAA 94 04/06/2020 08:51 AM    BMP Latest Ref Rng & Units 04/06/2020 04/03/2019 03/13/2018  Glucose 65 - 99 mg/dL 135(H)  148(H) 122(H)  BUN 7 - 25 mg/dL 22 21 26(H)  Creatinine 0.70 - 1.18 mg/dL 0.91 0.94 0.87  BUN/Creat Ratio 6 - 22 (calc) NOT APPLICABLE - -  Sodium 509 - 146 mmol/L 140 138 141  Potassium 3.5 - 5.3 mmol/L 4.5 4.3 4.5  Chloride 98 - 110 mmol/L 99 96 101  CO2 20 - 32 mmol/L 33(H) 33(H) 33(H)  Calcium 8.6 - 10.3 mg/dL 9.4 9.5 10.0   Current antihypertensive regimen:  Losartan 100 mg: one tablet daily How often are you checking your Blood Pressure? 1-2x per week Current home BP readings 06.02 138/70 06.06 134/72 06.14 130/70 What recent interventions/DTPs have been made by any provider to improve Blood Pressure control since last CPP Visit: None Any recent hospitalizations or ED visits since last visit with CPP? No What diet changes have been made to improve Blood Pressure Control?  No  Change What exercise is being done to improve your Blood Pressure Control?  No Change  Adherence Review: Is the patient currently on ACE/ARB medication? Yes Does the patient have >5 day gap between last estimated fill dates? Yes  I called and spoke with the patient about medication adherence. He stated that he has been doing well. He continues to check his blood pressure once to twice a week. There have been no current changes to his medications. He states that he is taking his medication as prescribed. I verified losartan 100 mg  was daily and he understood. He stated that he did not need any refills for his medications at this time. There have been no emergency department or urgent care visits since his last CPP or PCP visit. He states that he is not having any issues with this current pharmacy. His next scheduled CCM appointment is for March 2023.  Star Rating Drugs: Medication Dispensed  Quantity Pharmacy  Atorvastatin 40 mg 01.26.2022 [facility]  Losartan 50 mg 05.17.2022 [facility]   I called pharmacy and verified dispense dates.   Maia Breslow, [place]  Pharmacist Assistant [phone]

## 2020-11-26 ENCOUNTER — Telehealth: Payer: Self-pay

## 2020-11-26 NOTE — Telephone Encounter (Signed)
Spoke with pt gave Dr [name] advise, verbalized understanding

## 2020-11-26 NOTE — Telephone Encounter (Signed)
Stop the Miralax. Keep drinking water. Hopefully this will straighten out.

## 2020-12-22 ENCOUNTER — Other Ambulatory Visit: Payer: Self-pay

## 2020-12-22 MED ORDER — ATENOLOL-CHLORTHALIDONE 50-25 MG PO TABS
1.0000 | ORAL_TABLET | Freq: Every day | ORAL | 3 refills | Status: DC
Start: 2020-12-22 — End: 2022-01-12

## 2020-12-22 MED ORDER — LOSARTAN POTASSIUM 100 MG PO TABS
100.0000 mg | ORAL_TABLET | Freq: Every day | ORAL | 3 refills | Status: DC
Start: 2020-12-22 — End: 2022-01-12

## 2021-02-05 ENCOUNTER — Telehealth: Payer: Self-pay | Admitting: Pharmacist

## 2021-02-05 NOTE — Progress Notes (Signed)
Chronic Care Management Pharmacy Assistant   Name: Robert [name]  MRN: [id number] DOB: Nov 02, 1940   Reason for Encounter: Disease State Hypertension and Hyperlipidemia   Conditions to be addressed/monitored: HTN and HLD  Recent office visits:  None  Recent consult visits:  None  Hospital visits:  None in previous 6 months  Medications: Outpatient Encounter Medications as of 02/05/2021  Medication Sig   aspirin 81 MG tablet Take 81 mg by mouth daily.   atenolol-chlorthalidone (TENORETIC) 50-25 MG tablet Take 1 tablet by mouth daily.   atorvastatin (LIPITOR) 40 MG tablet Take 1 tablet (40 mg total) by mouth daily.   latanoprost (XALATAN) 0.005 % ophthalmic solution Place 1 drop into both eyes at bedtime.   losartan (COZAAR) 100 MG tablet Take 1 tablet (100 mg total) by mouth daily.   potassium chloride (KLOR-CON) 10 MEQ tablet Take 1 tablet (10 mEq total) by mouth daily.   tamsulosin (FLOMAX) 0.4 MG CAPS capsule Take 0.4 mg by mouth daily after supper.   No facility-administered encounter medications on file as of 02/05/2021.  Reviewed chart prior to disease state call. Spoke with patient regarding BP  Recent Office Vitals: BP Readings from Last 3 Encounters:  11/12/20 (!) 158/70  04/06/20 140/78  07/11/19 138/72   Pulse Readings from Last 3 Encounters:  11/12/20 (!) 54  04/06/20 82  04/03/19 (!) 58    Wt Readings from Last 3 Encounters:  11/12/20 223 lb (101.2 kg)  04/06/20 226 lb 6 oz (102.7 kg)  07/11/19 223 lb (101.2 kg)     Kidney Function Lab Results  Component Value Date/Time   CREATININE 0.91 04/06/2020 08:51 AM   CREATININE 0.94 04/03/2019 08:35 AM   CREATININE 0.87 03/13/2018 10:53 AM   GFR 77.93 04/03/2019 08:35 AM   GFRNONAA 81 04/06/2020 08:51 AM   GFRAA 94 04/06/2020 08:51 AM    BMP Latest Ref Rng & Units 04/06/2020 04/03/2019 03/13/2018  Glucose 65 - 99 mg/dL 135(H) 148(H) 122(H)  BUN 7 - 25 mg/dL 22 21 26(H)  Creatinine 0.70 - 1.18  mg/dL 0.91 0.94 0.87  BUN/Creat Ratio 6 - 22 (calc) NOT APPLICABLE - -  Sodium A999333 - 146 mmol/L 140 138 141  Potassium 3.5 - 5.3 mmol/L 4.5 4.3 4.5  Chloride 98 - 110 mmol/L 99 96 101  CO2 20 - 32 mmol/L 33(H) 33(H) 33(H)  Calcium 8.6 - 10.3 mg/dL 9.4 9.5 10.0    Current antihypertensive regimen:  atenolol/chlorthalidone 50-'25mg'$ , 1 tablet once daily  losartan '100mg'$ , 1 tablet once daily  How often are you checking your Blood Pressure? Patient reports he is checking his pressure every week or so. Current home BP readings: Patient reports his most recent reading was 138/72 he reports he is always within that range when he is checking. What recent interventions/DTPs have been made by any provider to improve Blood Pressure control since last CPP Visit: Patient reports none. Any recent hospitalizations or ED visits since last visit with CPP? None What diet changes have been made to improve Blood Pressure Control?  Patient reports for breakfast he will have one of the following: eggs and toast, cheerios, waffle or muffin. For Lunch he has been having home grown tomato sandwiches on wheat toast all summer. For dinner he will have chicken and green beans or salad, sometimes bacon and egg, or hamburger and on the weekends he and his wife may go out to [employer] for dinner What exercise is being done to improve your  Blood Pressure Control?  Patient reports he is getting f around well does not use cane or walker and gets in his walking. He reports he and his wife will be travelling to [place] on  tomorrow.   Adherence Review: Is the patient currently on ACE/ARB medication? Yes Does the patient have >5 day gap between last estimated fill dates? No   02/05/2021 Name: Robert [name] MRN: [id number] DOB: [birth year] Robert [name] is a 80 y.o. year old male who is a primary care patient of Robert Morale, MD.  Comprehensive medication review performed; Spoke to patient regarding  cholesterol  Lipid Panel    Component Value Date/Time   CHOL 125 04/06/2020 0851   TRIG 149 04/06/2020 0851   HDL 30 (L) 04/06/2020 0851   LDLCALC 72 04/06/2020 0851    10-year ASCVD risk score: The ASCVD Risk score Mikey Bussing DC Jr., et al., 2013) failed to calculate for the following reasons:   The valid total cholesterol range is 130 to 320 mg/dL  Current antihyperlipidemic regimen:  atorvastatin '40mg'$ , 1 tablet once daily  aspirin '81mg'$ , 1 tablet once daily ASCVD risk enhancing conditions: age >44 and HTN What recent interventions/DTPs have been made by any provider to improve Cholesterol control since last CPP Visit: Patient reports no changes Any recent hospitalizations or ED visits since last visit with CPP? None What diet changes have been made to improve Cholesterol?  Patient reports for breakfast he will have one of the following: eggs and toast, cheerios, waffle or muffin. For Lunch he has been having home grown tomato sandwiches on wheat toast all summer. For dinner he will have chicken and green beans or salad, sometimes bacon and egg, or hamburger and on the weekends he and his wife may go out to [employer] for dinner What exercise is being done to improve Cholesterol?  Patient reports he is getting f around well does not use cane or walker and gets in his walking. He reports he and his wife will be travelling to [facility] on  tomorrow.   Adherence Review: Does the patient have >5 day gap between last estimated fill dates? Yes   Care Gaps: Hepatitis C. Screening - Overdue Zoster Vaccine - Overdue Foot Exam - Overdue Eye Exam - Overdue COVID Booster #4(Pfizer) - Overdue HGB A1C - Overdue Flu Vaccine - Overdue CCM FU- 08-25-21 AWV - Done 10-20-20  Star Rating Drugs: Losartan (Cozaar) 100 mg - Last filled 12-24-2020 [facility] Atorvastatin (Lipitor) 40 mg - Last filled 07-01-2020 [facility] at [facility]  Call to [facility] to verify fill date for Lipitor spoke to [facility]  she confirmed the above fill date for Atorvastatin Call to [facility] Spoke to [place] he reports Atorvastatin has not been filled at [place] Pharmacist Assistant [phone]

## 2021-04-19 ENCOUNTER — Ambulatory Visit (INDEPENDENT_AMBULATORY_CARE_PROVIDER_SITE_OTHER): Payer: PPO | Admitting: Family Medicine

## 2021-04-19 ENCOUNTER — Encounter: Payer: Self-pay | Admitting: Family Medicine

## 2021-04-19 VITALS — BP 140/74 | HR 54 | Temp 97.9°F | Ht 66.0 in | Wt 219.5 lb

## 2021-04-19 DIAGNOSIS — Z23 Encounter for immunization: Secondary | ICD-10-CM

## 2021-04-19 DIAGNOSIS — Z0000 Encounter for general adult medical examination without abnormal findings: Secondary | ICD-10-CM

## 2021-04-19 LAB — CBC WITH DIFFERENTIAL/PLATELET
Basophils Absolute: 0 10*3/uL (ref 0.0–0.1)
Basophils Relative: 0.5 % (ref 0.0–3.0)
Eosinophils Absolute: 0.3 10*3/uL (ref 0.0–0.7)
Eosinophils Relative: 3.7 % (ref 0.0–5.0)
HCT: 46.8 % (ref 39.0–52.0)
Hemoglobin: 15.4 g/dL (ref 13.0–17.0)
Lymphocytes Relative: 18 % (ref 12.0–46.0)
Lymphs Abs: 1.4 10*3/uL (ref 0.7–4.0)
MCHC: 33 g/dL (ref 30.0–36.0)
MCV: 95.9 fl (ref 78.0–100.0)
Monocytes Absolute: 0.5 10*3/uL (ref 0.1–1.0)
Monocytes Relative: 6.1 % (ref 3.0–12.0)
Neutro Abs: 5.4 10*3/uL (ref 1.4–7.7)
Neutrophils Relative %: 71.7 % (ref 43.0–77.0)
Platelets: 188 10*3/uL (ref 150.0–400.0)
RBC: 4.88 Mil/uL (ref 4.22–5.81)
RDW: 13.2 % (ref 11.5–15.5)
WBC: 7.5 10*3/uL (ref 4.0–10.5)

## 2021-04-19 LAB — LIPID PANEL
Cholesterol: 135 mg/dL (ref 0–200)
HDL: 34.2 mg/dL — ABNORMAL LOW (ref >39.00)
LDL Cholesterol: 70 mg/dL (ref 0–99)
NonHDL: 100.41
Total CHOL/HDL Ratio: 4
Triglycerides: 152 mg/dL — ABNORMAL HIGH (ref 0.0–149.0)
VLDL: 30.4 mg/dL (ref 0.0–40.0)

## 2021-04-19 LAB — BASIC METABOLIC PANEL
BUN: 25 mg/dL — ABNORMAL HIGH (ref 6–23)
CO2: 32 mEq/L (ref 19–32)
Calcium: 10 mg/dL (ref 8.4–10.5)
Chloride: 99 mEq/L (ref 96–112)
Creatinine, Ser: 1 mg/dL (ref 0.40–1.50)
GFR: 72.09 mL/min (ref >60.00)
Glucose, Bld: 118 mg/dL — ABNORMAL HIGH (ref 70–99)
Potassium: 4.1 mEq/L (ref 3.5–5.1)
Sodium: 140 mEq/L (ref 135–145)

## 2021-04-19 LAB — HEPATIC FUNCTION PANEL
ALT: 23 U/L (ref 0–53)
AST: 19 U/L (ref 0–37)
Albumin: 4.3 g/dL (ref 3.5–5.2)
Alkaline Phosphatase: 89 U/L (ref 39–117)
Bilirubin, Direct: 0.1 mg/dL (ref 0.0–0.3)
Total Bilirubin: 0.6 mg/dL (ref 0.2–1.2)
Total Protein: 7.1 g/dL (ref 6.0–8.3)

## 2021-04-19 LAB — TSH: TSH: 3.13 u[IU]/mL (ref 0.35–5.50)

## 2021-04-19 LAB — HEMOGLOBIN A1C: Hgb A1c MFr Bld: 6.9 % — ABNORMAL HIGH (ref 4.6–6.5)

## 2021-04-19 MED ORDER — ATORVASTATIN CALCIUM 40 MG PO TABS
40.0000 mg | ORAL_TABLET | Freq: Every day | ORAL | 3 refills | Status: DC
Start: 2021-04-19 — End: 2022-06-08

## 2021-04-19 MED ORDER — MELOXICAM 15 MG PO TABS: 15.0000 mg | ORAL_TABLET | Freq: Every day | ORAL | 3 refills | Status: DC

## 2021-04-19 NOTE — Addendum Note (Signed)
Addended by: Amanda Cockayne on: 04/19/2021 10:59 AM   Modules accepted: Orders

## 2021-04-19 NOTE — Progress Notes (Signed)
   Subjective:    Patient ID: Robert [name], male    DOB: Mar 30, 1941, 80 y.o.   MRN: [id number]  HPI Here for a well exam. His only complaint is joint pains. He tries to walk 3 days a week. He takes Gabapentin at bedtime per Dr. Rush Farmer, but he still needs to take Ibuprofen frequently. He sees Urology on a regular basis.    Review of Systems  Constitutional: Negative.   HENT: Negative.    Eyes: Negative.   Respiratory: Negative.    Cardiovascular: Negative.   Gastrointestinal: Negative.   Genitourinary: Negative.   Musculoskeletal:  Positive for arthralgias.  Skin: Negative.   Neurological: Negative.   Psychiatric/Behavioral: Negative.        Objective:   Physical Exam Constitutional:      General: He is not in acute distress.    Appearance: He is well-developed. He is obese. He is not diaphoretic.  HENT:     Head: Normocephalic and atraumatic.     Right Ear: External ear normal.     Left Ear: External ear normal.     Nose: Nose normal.     Mouth/Throat:     Pharynx: No oropharyngeal exudate.  Eyes:     General: No scleral icterus.       Right eye: No discharge.        Left eye: No discharge.     Conjunctiva/sclera: Conjunctivae normal.     Pupils: Pupils are equal, round, and reactive to light.  Neck:     Thyroid: No thyromegaly.     Vascular: No JVD.     Trachea: No tracheal deviation.  Cardiovascular:     Rate and Rhythm: Normal rate and regular rhythm.     Heart sounds: Normal heart sounds. No murmur heard.   No friction rub. No gallop.  Pulmonary:     Effort: Pulmonary effort is normal. No respiratory distress.     Breath sounds: Normal breath sounds. No wheezing or rales.  Chest:     Chest wall: No tenderness.  Abdominal:     General: Bowel sounds are normal. There is no distension.     Palpations: Abdomen is soft. There is no mass.     Tenderness: There is no abdominal tenderness. There is no guarding or rebound.  Genitourinary:    Penis: No  tenderness.   Musculoskeletal:        General: No tenderness. Normal range of motion.     Cervical back: Neck supple.  Lymphadenopathy:     Cervical: No cervical adenopathy.  Skin:    General: Skin is warm and dry.     Coloration: Skin is not pale.     Findings: No erythema or rash.  Neurological:     Mental Status: He is alert and oriented to person, place, and time.     Cranial Nerves: No cranial nerve deficit.     Motor: No abnormal muscle tone.     Coordination: Coordination normal.     Deep Tendon Reflexes: Reflexes are normal and symmetric. Reflexes normal.  Psychiatric:        Behavior: Behavior normal.        Thought Content: Thought content normal.        Judgment: Judgment normal.          Assessment & Plan:  Well exam. We discussed diet and exercise. Get fasting labs.  [name], MD

## 2021-04-21 ENCOUNTER — Telehealth: Payer: Self-pay | Admitting: Pharmacist

## 2021-04-21 ENCOUNTER — Other Ambulatory Visit: Payer: Self-pay

## 2021-04-21 DIAGNOSIS — E1140 Type 2 diabetes mellitus with diabetic neuropathy, unspecified: Secondary | ICD-10-CM

## 2021-04-21 NOTE — Chronic Care Management (AMB) (Signed)
Chronic Care Management Pharmacy Assistant   Name: Robert [name]  MRN: [id number] DOB: 1940/09/03  Reason for Encounter: BP Adherance report    Conditions to be addressed/monitored: HTN  Recent office visits:  04/19/21 Laurey Morale, MD - Patient presented for Preventative healthcare and other concerns. Prescribed Meloxicam 15 mg.   Recent consult visits:  None  Hospital visits:  None in previous 6 months  Medications: Outpatient Encounter Medications as of 04/21/2021  Medication Sig   aspirin 81 MG tablet Take 81 mg by mouth daily.   atenolol-chlorthalidone (TENORETIC) 50-25 MG tablet Take 1 tablet by mouth daily.   atorvastatin (LIPITOR) 40 MG tablet Take 1 tablet (40 mg total) by mouth daily.   latanoprost (XALATAN) 0.005 % ophthalmic solution Place 1 drop into both eyes at bedtime.   losartan (COZAAR) 100 MG tablet Take 1 tablet (100 mg total) by mouth daily.   meloxicam (MOBIC) 15 MG tablet Take 1 tablet (15 mg total) by mouth daily.   potassium chloride (KLOR-CON) 10 MEQ tablet Take 1 tablet (10 mEq total) by mouth daily.   tamsulosin (FLOMAX) 0.4 MG CAPS capsule Take 0.4 mg by mouth daily after supper.   No facility-administered encounter medications on file as of 04/21/2021.  Reviewed chart prior to disease state call. Spoke with patient regarding BP  Recent Office Vitals: BP Readings from Last 3 Encounters:  04/19/21 140/74  11/12/20 (!) 158/70  04/06/20 140/78   Pulse Readings from Last 3 Encounters:  04/19/21 (!) 54  11/12/20 (!) 54  04/06/20 82    Wt Readings from Last 3 Encounters:  04/19/21 219 lb 8 oz (99.6 kg)  11/12/20 223 lb (101.2 kg)  04/06/20 226 lb 6 oz (102.7 kg)     Kidney Function Lab Results  Component Value Date/Time   CREATININE 1.00 04/19/2021 10:59 AM   CREATININE 0.91 04/06/2020 08:51 AM   CREATININE 0.94 04/03/2019 08:35 AM   GFR 72.09 04/19/2021 10:59 AM   GFRNONAA 81 04/06/2020 08:51 AM   GFRAA 94 04/06/2020 08:51  AM    BMP Latest Ref Rng & Units 04/19/2021 04/06/2020 04/03/2019  Glucose 70 - 99 mg/dL 118(H) 135(H) 148(H)  BUN 6 - 23 mg/dL 25(H) 22 21  Creatinine 0.40 - 1.50 mg/dL 1.00 0.91 0.94  BUN/Creat Ratio 6 - 22 (calc) - NOT APPLICABLE -  Sodium 720 - 145 mEq/L 140 140 138  Potassium 3.5 - 5.1 mEq/L 4.1 4.5 4.3  Chloride 96 - 112 mEq/L 99 99 96  CO2 19 - 32 mEq/L 32 33(H) 33(H)  Calcium 8.4 - 10.5 mg/dL 10.0 9.4 9.5    Current antihypertensive regimen:  atenolol/chlorthalidone 50-25mg , 1 tablet once daily  losartan 100mg , 1 tablet once daily   04/21/21 Attempted to reach patient for recent home readings for Adh report for Blood pressure left vm   Per [name] patient called the office and I should call him back, he reports he was not trying to reach me in reference to his pressures, he reports he was just in the office this week, had not began taking them as of yet and he ended the call. Pharmacist aware.  Care Gaps: Hepatitis C. Screening - Overdue Zoster Vaccine - Overdue Foot Exam - Overdue Eye Exam - Overdue COVID Booster #4(Pfizer) - Overdue HGB A1C - Overdue Flu Vaccine - Overdue CCM FU- 3/23 AWV - Done 5/22 BP 140/78 - office 11/14 Star Rating Drugs: Losartan (Cozaar) 100 mg - Last filled 03/03/2021 90 DS  at [facility] Atorvastatin (Lipitor) 40 mg - Last filled 03/30/2021 90 DS at [place] Clinical Pharmacist Assistant [phone]

## 2021-05-10 DIAGNOSIS — R059 Cough, unspecified: Secondary | ICD-10-CM | POA: Diagnosis not present

## 2021-05-10 DIAGNOSIS — J069 Acute upper respiratory infection, unspecified: Secondary | ICD-10-CM | POA: Diagnosis not present

## 2021-06-24 DIAGNOSIS — H40053 Ocular hypertension, bilateral: Secondary | ICD-10-CM | POA: Diagnosis not present

## 2021-07-20 ENCOUNTER — Ambulatory Visit: Payer: PPO | Admitting: Family Medicine

## 2021-07-21 ENCOUNTER — Encounter: Payer: Self-pay | Admitting: Family Medicine

## 2021-07-21 ENCOUNTER — Other Ambulatory Visit: Payer: PPO

## 2021-07-21 ENCOUNTER — Ambulatory Visit (INDEPENDENT_AMBULATORY_CARE_PROVIDER_SITE_OTHER): Payer: PPO | Admitting: Family Medicine

## 2021-07-21 VITALS — BP 124/70 | HR 65 | Temp 98.9°F | Wt 218.0 lb

## 2021-07-21 DIAGNOSIS — E1140 Type 2 diabetes mellitus with diabetic neuropathy, unspecified: Secondary | ICD-10-CM | POA: Diagnosis not present

## 2021-07-21 DIAGNOSIS — J40 Bronchitis, not specified as acute or chronic: Secondary | ICD-10-CM | POA: Diagnosis not present

## 2021-07-21 LAB — POCT GLYCOSYLATED HEMOGLOBIN (HGB A1C): Hemoglobin A1C: 6.7 % — AB (ref 4.0–5.6)

## 2021-07-21 MED ORDER — CLARITHROMYCIN 500 MG PO TABS: 500.0000 mg | ORAL_TABLET | Freq: Two times a day (BID) | ORAL | 0 refills | Status: DC

## 2021-07-21 MED ORDER — HYDROCODONE BIT-HOMATROP MBR 5-1.5 MG/5ML PO SOLN: 5.0000 mL | ORAL | 0 refills | Status: DC | PRN

## 2021-07-21 NOTE — Addendum Note (Signed)
Addended by: Rosalyn Gess D on: 07/21/2021 08:01 AM   Modules accepted: Orders

## 2021-07-21 NOTE — Addendum Note (Signed)
Addended by: Rosalyn Gess D on: 07/21/2021 09:21 AM   Modules accepted: Orders

## 2021-07-21 NOTE — Progress Notes (Signed)
° °  Subjective:    Patient ID: Robert [name], male    DOB: 06-May-1941, 81 y.o.   MRN: [id number]  HPI Here for 8 weeks of chest tightness and a cough that produces clear sputum. No fever or SOB. No sinus congestion or PND. He went to an urgent care on 05-10-21 for this and was told he had a viral URI. Given Benzonatate which did not help. His wife has a similar cough.    Review of Systems  Constitutional: Negative.   HENT: Negative.    Eyes: Negative.   Respiratory:  Positive for cough and chest tightness. Negative for shortness of breath and wheezing.   Cardiovascular: Negative.       Objective:   Physical Exam Constitutional:      Appearance: Normal appearance. He is not ill-appearing.  Cardiovascular:     Rate and Rhythm: Normal rate and regular rhythm.     Pulses: Normal pulses.     Heart sounds: Normal heart sounds.  Pulmonary:     Effort: Pulmonary effort is normal.     Breath sounds: Normal breath sounds.  Lymphadenopathy:     Cervical: No cervical adenopathy.  Neurological:     Mental Status: He is alert.          Assessment & Plan:  Bronchitis, likely atypical. Treat with 10 days of Biaxin. Use Hycodan for the cough as needed. He will hold the Lipitor until he finishes the Biaxin.  [name], MD

## 2021-07-21 NOTE — Addendum Note (Signed)
Addended by: Wyvonne Lenz on: 07/21/2021 09:17 AM   Modules accepted: Orders

## 2021-08-02 ENCOUNTER — Other Ambulatory Visit: Payer: Self-pay | Admitting: Family Medicine

## 2021-08-25 ENCOUNTER — Telehealth: Payer: PPO

## 2021-10-08 ENCOUNTER — Telehealth: Payer: Self-pay | Admitting: Pharmacist

## 2021-10-08 NOTE — Progress Notes (Signed)
A user error has taken place: encounter opened in error, closed for administrative reasons.

## 2021-10-08 NOTE — Chronic Care Management (AMB) (Signed)
    Chronic Care Management Pharmacy Assistant   Name: [name]  MRN: [id number] DOB: 1940-08-30  Reason for Encounter: Offer F/U with [name] and [facility] medication compliance report.   Recent office visits:  07/21/21  [name] A - Patient presented for Bronchitis and other concerns. Prescribed Clarithromycin 500 mg. Prescribed Hydrocodone 5-1.5 MG/ML   Recent consult visits:  06/24/21 [name]) - Claims encounter for Ocular hypertension bilateral. No other visit details available.  05/10/21 [name] - Patient presented to [facility] for Viral upper respiratory tract infection and cough. No other visit details available.  Hospital visits:  None in previous 6 months  Medications: Outpatient Encounter Medications as of 10/08/2021  Medication Sig   aspirin 81 MG tablet Take 81 mg by mouth daily.   atenolol-chlorthalidone (TENORETIC) 50-25 MG tablet Take 1 tablet by mouth daily.   atorvastatin (LIPITOR) 40 MG tablet Take 1 tablet (40 mg total) by mouth daily.   clarithromycin (BIAXIN) 500 MG tablet Take 1 tablet (500 mg total) by mouth 2 (two) times daily.   HYDROcodone bit-homatropine (HYCODAN) 5-1.5 MG/5ML syrup Take 5 mLs by mouth every 4 (four) hours as needed for cough.   latanoprost (XALATAN) 0.005 % ophthalmic solution Place 1 drop into both eyes at bedtime.   losartan (COZAAR) 100 MG tablet Take 1 tablet (100 mg total) by mouth daily.   meloxicam (MOBIC) 15 MG tablet Take 1 tablet (15 mg total) by mouth daily.   potassium chloride (KLOR-CON) 10 MEQ tablet Take 1 tablet by mouth daily   tamsulosin (FLOMAX) 0.4 MG CAPS capsule Take 0.4 mg by mouth daily after supper.   No facility-administered encounter medications on file as of 10/08/2021.  Notes: Call to patient to see if he is filling his Atorvastatin at an unlisted pharmacy or is having trouble affording the medication as he is on the compliance report as 40-49% compliant. Call to both pharmacies  on file and fill dates are accurate Last filled 05/28/21 90 DS at [facility].  Patient reports he is no sure if it was filled extra at some point but he still has plenty on hand and is taking daily as directed.   Care Gaps: Hepatitis C Screening - Overdue Zoster Vaccine - Overdue Foot Exam - Overdue Eye Exam - Overdue COVID Booster - Overdue CCM- CCS AWV- 5/22 BP- 124/70 ( 07/21/21) Lab Results  Component Value Date   HGBA1C 6.7 (A) 07/21/2021    Star Rating Drugs: Losartan (Cozaar) 100 mg - Last filled 05/11/2021 90 DS at [facility] Atorvastatin (Lipitor) 40 mg - Last filled 05/28/2021 90 DS at [facility] (Verified as accurate/ patient reports on hand abundance and is taking)   [place] Pharmacist Assistant [phone]

## 2021-10-22 ENCOUNTER — Ambulatory Visit (INDEPENDENT_AMBULATORY_CARE_PROVIDER_SITE_OTHER): Payer: PPO

## 2021-10-22 VITALS — Ht 66.0 in | Wt 218.0 lb

## 2021-10-22 DIAGNOSIS — Z0000 Encounter for general adult medical examination without abnormal findings: Secondary | ICD-10-CM

## 2021-10-22 NOTE — Patient Instructions (Addendum)
Robert [name] , Thank you for taking time to come for your Medicare Wellness Visit. I appreciate your ongoing commitment to your health goals. Please review the following plan we discussed and let me know if I can assist you in the future.   These are the goals we discussed:  Goals       DIET - INCREASE WATER INTAKE      Goal is 6 bottles of water daily       Increase physical activity      Goal is 150 minutes of exercise weekly; you are close!       Patient stated (pt-stated)      Take care daughter        This is a list of the screening recommended for you and due dates:  Health Maintenance  Topic Date Due   Complete foot exam   03/14/2019   Eye exam for diabetics  01/07/2020   COVID-19 Vaccine (5 - Booster for Pfizer series) 11/07/2021*   Zoster (Shingles) Vaccine (1 of 2) 01/22/2022*   Hepatitis C Screening: USPSTF Recommendation to screen - Ages 18-79 yo.  10/23/2022*   Flu Shot  01/04/2022   Hemoglobin A1C  01/18/2022   Tetanus Vaccine  02/03/2028   Pneumonia Vaccine  Completed   HPV Vaccine  Aged Out  *Topic was postponed. The date shown is not the original due date.   Opioid Pain Medicine Management Opioids are powerful medicines that are used to treat moderate to severe pain. When used for short periods of time, they can help you to: Sleep better. Do better in physical or occupational therapy. Feel better in the first few days after an injury. Recover from surgery. Opioids should be taken with the supervision of a trained health care provider. They should be taken for the shortest period of time possible. This is because opioids can be addictive, and the longer you take opioids, the greater your risk of addiction. This addiction can also be called opioid use disorder. What are the risks? Using opioid pain medicines for longer than 3 days increases your risk of side effects. Side effects include: Constipation. Nausea and vomiting. Breathing difficulties  (respiratory depression). Drowsiness. Confusion. Opioid use disorder. Itching. Taking opioid pain medicine for a long period of time can affect your ability to do daily tasks. It also puts you at risk for: Motor vehicle crashes. Depression. Suicide. Heart attack. Overdose, which can be life-threatening. What is a pain treatment plan? A pain treatment plan is an agreement between you and your health care provider. Pain is unique to each person, and treatments vary depending on your condition. To manage your pain, you and your health care provider need to work together. To help you do this: Discuss the goals of your treatment, including how much pain you might expect to have and how you will manage the pain. Review the risks and benefits of taking opioid medicines. Remember that a good treatment plan uses more than one approach and minimizes the chance of side effects. Be honest about the amount of medicines you take and about any drug or alcohol use. Get pain medicine prescriptions from only one health care provider. Pain can be managed with many types of alternative treatments. Ask your health care provider to refer you to one or more specialists who can help you manage pain through: Physical or occupational therapy. Counseling (cognitive behavioral therapy). Good nutrition. Biofeedback. Massage. Meditation. Non-opioid medicine. Following a gentle exercise program. How to use opioid pain  medicine Taking medicine Take your pain medicine exactly as told by your health care provider. Take it only when you need it. If your pain gets less severe, you may take less than your prescribed dose if your health care provider approves. If you are not having pain, do nottake pain medicine unless your health care provider tells you to take it. If your pain is severe, do nottry to treat it yourself by taking more pills than instructed on your prescription. Contact your health care provider for  help. Write down the times when you take your pain medicine. It is easy to become confused while on pain medicine. Writing the time can help you avoid overdose. Take other over-the-counter or prescription medicines only as told by your health care provider. Keeping yourself and others safe  While you are taking opioid pain medicine: Do not drive, use machinery, or power tools. Do not sign legal documents. Do not drink alcohol. Do not take sleeping pills. Do not supervise children by yourself. Do not do activities that require climbing or being in high places. Do not go to a lake, river, ocean, spa, or swimming pool. Do not share your pain medicine with anyone. Keep pain medicine in a locked cabinet or in a secure area where pets and children cannot reach it. Stopping your use of opioids If you have been taking opioid medicine for more than a few weeks, you may need to slowly decrease (taper) how much you take until you stop completely. Tapering your use of opioids can decrease your risk of symptoms of withdrawal, such as: Pain and cramping in the abdomen. Nausea. Sweating. Sleepiness. Restlessness. Uncontrollable shaking (tremors). Cravings for the medicine. Do not attempt to taper your use of opioids on your own. Talk with your health care provider about how to do this. Your health care provider may prescribe a step-down schedule based on how much medicine you are taking and how long you have been taking it. Getting rid of leftover pills Do not save any leftover pills. Get rid of leftover pills safely by: Taking the medicine to a prescription take-back program. This is usually offered by the county or law enforcement. Bringing them to a pharmacy that has a drug disposal container. Flushing them down the toilet. Check the label or package insert of your medicine to see whether this is safe to do. Throwing them out in the trash. Check the label or package insert of your medicine to see  whether this is safe to do. If it is safe to throw it out, remove the medicine from the original container, put it into a sealable bag or container, and mix it with used coffee grounds, food scraps, dirt, or cat litter before putting it in the trash. Follow these instructions at home: Activity Do exercises as told by your health care provider. Avoid activities that make your pain worse. Return to your normal activities as told by your health care provider. Ask your health care provider what activities are safe for you. General instructions You may need to take these actions to prevent or treat constipation: Drink enough fluid to keep your urine pale yellow. Take over-the-counter or prescription medicines. Eat foods that are high in fiber, such as beans, whole grains, and fresh fruits and vegetables. Limit foods that are high in fat and processed sugars, such as fried or sweet foods. Keep all follow-up visits. This is important. Where to find support If you have been taking opioids for a long time, you  may benefit from receiving support for quitting from a local support group or counselor. Ask your health care provider for a referral to these resources in your area. Where to find more information Centers for Disease Control and Prevention (CDC): http://www.wolf.info/ [place] Food and Drug Administration (FDA): [employer] Get help right away if: You may have taken too much of an opioid (overdosed). Common symptoms of an overdose: Your breathing is slower or more shallow than normal. You have a very slow heartbeat (pulse). You have slurred speech. You have nausea and vomiting. Your pupils become very small. You have other potential symptoms: You are very confused. You faint or feel like you will faint. You have cold, clammy skin. You have blue lips or fingernails. You have thoughts of harming yourself or harming others. These symptoms may represent a serious problem that is an emergency. Do not wait  to see if the symptoms will go away. Get medical help right away. Call your local emergency services (911 in the [place] Do not drive yourself to the hospital.  If you ever feel like you may hurt yourself or others, or have thoughts about taking your own life, get help right away. Go to your nearest emergency department or: Call your local emergency services (911 in the [place] Call the [facility] 207 557 3647 in the [place] Call a suicide crisis helpline, such as the [facility] at (361)736-9284 or 988 in the [place]. This is open 24 hours a day in the [place] Text the Crisis Text Line at (534)635-1340 (in the [place].). Summary Opioid medicines can help you manage moderate to severe pain for a short period of time. A pain treatment plan is an agreement between you and your health care provider. Discuss the goals of your treatment, including how much pain you might expect to have and how you will manage the pain. If you think that you or someone else may have taken too much of an opioid, get medical help right away. This information is not intended to replace advice given to you by your health care provider. Make sure you discuss any questions you have with your health care provider. Document Revised: 12/16/2020 Document Reviewed: 09/02/2020 Elsevier Patient Education  [place] directives: Yes Patient will submit copy  Conditions/risks identified: None  Next appointment: Follow up in one year for your annual wellness visit.   Preventive Care 24 Years and Older, Male Preventive care refers to lifestyle choices and visits with your health care provider that can promote health and wellness. What does preventive care include? A yearly physical exam. This is also called an annual well check. Dental exams once or twice a year. Routine eye exams. Ask your health care provider how often you should have your eyes checked. Personal lifestyle  choices, including: Daily care of your teeth and gums. Regular physical activity. Eating a healthy diet. Avoiding tobacco and drug use. Limiting alcohol use. Practicing safe sex. Taking low doses of aspirin every day. Taking vitamin and mineral supplements as recommended by your health care provider. What happens during an annual well check? The services and screenings done by your health care provider during your annual well check will depend on your age, overall health, lifestyle risk factors, and family history of disease. Counseling  Your health care provider may ask you questions about your: Alcohol use. Tobacco use. Drug use. Emotional well-being. Home and relationship well-being. Sexual activity. Eating habits. History of falls. Memory and ability to  understand (cognition). Work and work Statistician. Screening  You may have the following tests or measurements: Height, weight, and BMI. Blood pressure. Lipid and cholesterol levels. These may be checked every 5 years, or more frequently if you are over 88 years old. Skin check. Lung cancer screening. You may have this screening every year starting at age 73 if you have a 30-pack-year history of smoking and currently smoke or have quit within the past 15 years. Fecal occult blood test (FOBT) of the stool. You may have this test every year starting at age 85. Flexible sigmoidoscopy or colonoscopy. You may have a sigmoidoscopy every 5 years or a colonoscopy every 10 years starting at age 47. Prostate cancer screening. Recommendations will vary depending on your family history and other risks. Hepatitis C blood test. Hepatitis B blood test. Sexually transmitted disease (STD) testing. Diabetes screening. This is done by checking your blood sugar (glucose) after you have not eaten for a while (fasting). You may have this done every 1-3 years. Abdominal aortic aneurysm (AAA) screening. You may need this if you are a current or  former smoker. Osteoporosis. You may be screened starting at age 16 if you are at high risk. Talk with your health care provider about your test results, treatment options, and if necessary, the need for more tests. Vaccines  Your health care provider may recommend certain vaccines, such as: Influenza vaccine. This is recommended every year. Tetanus, diphtheria, and acellular pertussis (Tdap, Td) vaccine. You may need a Td booster every 10 years. Zoster vaccine. You may need this after age 32. Pneumococcal 13-valent conjugate (PCV13) vaccine. One dose is recommended after age 67. Pneumococcal polysaccharide (PPSV23) vaccine. One dose is recommended after age 23. Talk to your health care provider about which screenings and vaccines you need and how often you need them. This information is not intended to replace advice given to you by your health care provider. Make sure you discuss any questions you have with your health care provider. Document Released: 06/19/2015 Document Revised: 02/10/2016 Document Reviewed: 03/24/2015 Elsevier Interactive Patient Education  2017 [place] Prevention in the Home Falls can cause injuries. They can happen to people of all ages. There are many things you can do to make your home safe and to help prevent falls. What can I do on the outside of my home? Regularly fix the edges of walkways and driveways and fix any cracks. Remove anything that might make you trip as you walk through a door, such as a raised step or threshold. Trim any bushes or trees on the path to your home. Use bright outdoor lighting. Clear any walking paths of anything that might make someone trip, such as rocks or tools. Regularly check to see if handrails are loose or broken. Make sure that both sides of any steps have handrails. Any raised decks and porches should have guardrails on the edges. Have any leaves, snow, or ice cleared regularly. Use sand or salt on walking paths  during winter. Clean up any spills in your garage right away. This includes oil or grease spills. What can I do in the bathroom? Use night lights. Install grab bars by the toilet and in the tub and shower. Do not use towel bars as grab bars. Use non-skid mats or decals in the tub or shower. If you need to sit down in the shower, use a plastic, non-slip stool. Keep the floor dry. Clean up any water that spills on the floor as  soon as it happens. Remove soap buildup in the tub or shower regularly. Attach bath mats securely with double-sided non-slip rug tape. Do not have throw rugs and other things on the floor that can make you trip. What can I do in the bedroom? Use night lights. Make sure that you have a light by your bed that is easy to reach. Do not use any sheets or blankets that are too big for your bed. They should not hang down onto the floor. Have a firm chair that has side arms. You can use this for support while you get dressed. Do not have throw rugs and other things on the floor that can make you trip. What can I do in the kitchen? Clean up any spills right away. Avoid walking on wet floors. Keep items that you use a lot in easy-to-reach places. If you need to reach something above you, use a strong step stool that has a grab bar. Keep electrical cords out of the way. Do not use floor polish or wax that makes floors slippery. If you must use wax, use non-skid floor wax. Do not have throw rugs and other things on the floor that can make you trip. What can I do with my stairs? Do not leave any items on the stairs. Make sure that there are handrails on both sides of the stairs and use them. Fix handrails that are broken or loose. Make sure that handrails are as long as the stairways. Check any carpeting to make sure that it is firmly attached to the stairs. Fix any carpet that is loose or worn. Avoid having throw rugs at the top or bottom of the stairs. If you do have throw  rugs, attach them to the floor with carpet tape. Make sure that you have a light switch at the top of the stairs and the bottom of the stairs. If you do not have them, ask someone to add them for you. What else can I do to help prevent falls? Wear shoes that: Do not have high heels. Have rubber bottoms. Are comfortable and fit you well. Are closed at the toe. Do not wear sandals. If you use a stepladder: Make sure that it is fully opened. Do not climb a closed stepladder. Make sure that both sides of the stepladder are locked into place. Ask someone to hold it for you, if possible. Clearly mark and make sure that you can see: Any grab bars or handrails. First and last steps. Where the edge of each step is. Use tools that help you move around (mobility aids) if they are needed. These include: Canes. Walkers. Scooters. Crutches. Turn on the lights when you go into a dark area. Replace any light bulbs as soon as they burn out. Set up your furniture so you have a clear path. Avoid moving your furniture around. If any of your floors are uneven, fix them. If there are any pets around you, be aware of where they are. Review your medicines with your doctor. Some medicines can make you feel dizzy. This can increase your chance of falling. Ask your doctor what other things that you can do to help prevent falls. This information is not intended to replace advice given to you by your health care provider. Make sure you discuss any questions you have with your health care provider. Document Released: 03/19/2009 Document Revised: 10/29/2015 Document Reviewed: 06/27/2014 Elsevier Interactive Patient Education  2017 [employer].

## 2021-10-22 NOTE — Progress Notes (Signed)
Subjective:   Robert [name] is a 81 y.o. male who presents for Medicare Annual/Subsequent preventive examination.  Review of Systems    Virtual Visit via Telephone Note  I connected with  Robert [name] on 10/22/21 at  1:00 PM EDT by telephone and verified that I am speaking with the correct person using two identifiers.  Location: Patient: Home Provider: Office Persons participating in the virtual visit: patient/Nurse Health Advisor   I discussed the limitations, risks, security and privacy concerns of performing an evaluation and management service by telephone and the availability of in person appointments. The patient expressed understanding and agreed to proceed.  Interactive audio and video telecommunications were attempted between this nurse and patient, however failed, due to patient having technical difficulties OR patient did not have access to video capability.  We continued and completed visit with audio only.  Some vital signs may be absent or patient reported.   Criselda Peaches, LPN  Cardiac Risk Factors include: advanced age (>28mn, >>31women);hypertension;male gender     Objective:    Today's Vitals   10/22/21 1309  Weight: 218 lb (98.9 kg)  Height: '5\' 6"'$  (1.676 m)   Body mass index is 35.19 kg/m.     10/22/2021    1:19 PM 10/20/2020    9:16 AM 07/11/2019    8:27 AM 01/09/2019    8:03 AM 03/13/2018    9:12 AM 10/20/2016    8:14 AM  Advanced Directives  Does Patient Have a Medical Advance Directive? Yes Yes No No No No  Type of AParamedicof ACors[place]anaLiving will HHavilandLiving will      Does patient want to make changes to medical advance directive? No - Patient declined No - Patient declined      Copy of [place]? No - copy requested No - copy requested      Would patient like information on creating a medical advance directive?   No - Patient declined No - Patient declined  No  - Patient declined    Current Medications (verified) Outpatient Encounter Medications as of 10/22/2021  Medication Sig   aspirin 81 MG tablet Take 81 mg by mouth daily.   atenolol-chlorthalidone (TENORETIC) 50-25 MG tablet Take 1 tablet by mouth daily.   atorvastatin (LIPITOR) 40 MG tablet Take 1 tablet (40 mg total) by mouth daily.   clarithromycin (BIAXIN) 500 MG tablet Take 1 tablet (500 mg total) by mouth 2 (two) times daily. (Patient not taking: Reported on 10/22/2021)   HYDROcodone bit-homatropine (HYCODAN) 5-1.5 MG/5ML syrup Take 5 mLs by mouth every 4 (four) hours as needed for cough. (Patient not taking: Reported on 10/22/2021)   latanoprost (XALATAN) 0.005 % ophthalmic solution Place 1 drop into both eyes at bedtime.   losartan (COZAAR) 100 MG tablet Take 1 tablet (100 mg total) by mouth daily.   meloxicam (MOBIC) 15 MG tablet Take 1 tablet (15 mg total) by mouth daily.   potassium chloride (KLOR-CON) 10 MEQ tablet Take 1 tablet by mouth daily   tamsulosin (FLOMAX) 0.4 MG CAPS capsule Take 0.4 mg by mouth daily after supper.   No facility-administered encounter medications on file as of 10/22/2021.    Allergies (verified) Patient has no known allergies.   History: Past Medical History:  Diagnosis Date   CAD (coronary artery disease)    per pt cardiologist dr nJohnsie Cancel last office visit 02-05-2010, currently followed by pcp , dr fry  Diverticulosis of colon    ED (erectile dysfunction) of non-organic origin    History of colon polyps    02/ 2009 benign   Hyperlipidemia    Hypertension    Nodular prostate with lower urinary tract symptoms    Prostate cancer [facility]) urologist-  dr wrenn/  oncologist-  dr Tammi Klippel   dx 06-15-2016,  Stage T2a, Gleason 3+3,  PSA 4.64,  vol 23.4cc   S/P CABG x 4 05/02/2005   LIMA to LAD,  radial graft to CFx marginal,  SVG to diagonal and RCA   Wears glasses    Past Surgical History:  Procedure Laterality Date   CARDIAC CATHETERIZATION   04-29-2005  dr w. Albertine Patricia   severe 2V CAD-- 90% LAD diagonal, 80% CFx ostium,  50% pRCA   COLONOSCOPY  08/03/2007   per Dr. Fuller Plan, benign polyp, he declines to have any more    CORONARY ARTERY BYPASS GRAFT  05-02-2005  dr Lucianne Lei tright   LIMA to LAD,  radial graft to CFx marginal,  SVG to diagonal and RCA   EXICIOSN MASS INDEX FINGER Right 07/2015   per pt benign   PROSTATE BIOPSY  06/15/2016   RADIOACTIVE SEED IMPLANT [name] 10/20/2016   Procedure: RADIOACTIVE SEED IMPLANT/BRACHYTHERAPY IMPLANT, 71 seeds implanted, no seeds found in bladder;  Surgeon: Irine Seal, MD;  Location: [facility];  Service: Urology;  Laterality: [name];   Family History  Problem Relation Age of Onset   Sudden death Other    Cancer Maternal Aunt        ovarian   Cancer Maternal Uncle        prostate   Cancer Maternal Grandmother        breast   Cancer Maternal Grandfather        prostate   Cancer Maternal Uncle        prostate   Cancer Maternal Uncle        prostate   Cancer Maternal Uncle        prostate   Social History   Socioeconomic History   Marital status: Married    Spouse name: Not on file   Number of children: 2   Years of education: 4 years college   Highest education level: Bachelor's degree (e.g., BA, AB, BS)  Occupational History   Occupation: retired  Tobacco Use   Smoking status: Never   Smokeless tobacco: Never  Substance and Sexual Activity   Alcohol use: No    Alcohol/week: 0.0 standard drinks   Drug use: No   Sexual activity: Not on file  Other Topics Concern   Not on file  Social History Narrative   HH 2   Married   2 children locally   3 grandchildren   Social Determinants of Health   Financial Resource Strain: Low Risk    Difficulty of Paying Living Expenses: Not hard at all  Food Insecurity: No Food Insecurity   Worried About Charity fundraiser in the Last Year: Never true   Arboriculturist in the Last Year: Never true  Transportation Needs: No  Transportation Needs   Lack of Transportation (Medical): No   Lack of Transportation (Non-Medical): No  Physical Activity: Sufficiently Active   Days of Exercise per Week: 4 days   Minutes of Exercise per Session: 40 min  Stress: No Stress Concern Present   Feeling of Stress : Not at all  Social Connections: Socially Integrated   Frequency of Communication with Friends  and Family: Three times a week   Frequency of Social Gatherings with Friends and Family: Three times a week   Attends Religious Services: More than 4 times per year   Active Member of Clubs or Organizations: Yes   Attends Music therapist: More than 4 times per year   Marital Status: Married    Tobacco Counseling Counseling given: Not Answered   Clinical Intake: Nutrition Risk Assessment:  Has the patient had any N/V/D within the last 2 months?  No  Does the patient have any non-healing wounds?  No  Has the patient had any unintentional weight loss or weight gain?  No   Diabetes:  Is the patient diabetic?  Pre Diabetic If diabetic, was a CBG obtained today?  No  Did the patient bring in their glucometer from home?  No  How often do you monitor your CBG's? PRN.   Financial Strains and Diabetes Management:  Are you having any financial strains with the device, your supplies or your medication? No .  Does the patient want to be seen by Chronic Care Management for management of their diabetes?  No  Would the patient like to be referred to a Nutritionist or for Diabetic Management?  No   Diabetic Exams:  Diabetic Eye Exam: Completed Yes. Overdue for diabetic eye exam. Pt has been advised about the importance in completing this exam. A referral has been placed today. Message sent to referral coordinator for scheduling purposes. Advised pt to expect a call from office referred to regarding appt.  Diabetic Foot Exam: Completed Yes. Pt has been advised about the importance in completing this exam. Pt is  scheduled for diabetic foot exam on Followed By PCP.   Pre-visit preparation completed: Yes BMI - recorded: 35.45 Nutritional Status: BMI > 30  Obese Nutritional Risks: None Diabetes: Yes (Pre Diabetic) CBG done?: No Did pt. bring in CBG monitor from home?: No How often do you need to have someone help you when you read instructions, pamphlets, or other written materials from your doctor or pharmacy?: 1 - Never  Diabetic? Yes  Interpreter Needed?: No  Information entered by :: Rolene Arbour LPN   Activities of Daily Living    10/22/2021    1:17 PM 10/18/2021    9:58 AM  In your present state of health, do you have any difficulty performing the following activities:  Hearing? 0 0  Vision? 0 0  Difficulty concentrating or making decisions? 0 0  Walking or climbing stairs? 0 0  Dressing or bathing? 0 0  Doing errands, shopping? 0 0  Preparing Food and eating ? N N  Using the Toilet? N N  In the past six months, have you accidently leaked urine? Y Y  Comment Followed by Dr Roni Bread   Do you have problems with loss of bowel control? N N  Managing your Medications? N N  Managing your Finances? N N  Housekeeping or managing your Housekeeping? N N    Patient Care Team: Laurey Morale, MD as PCP - General Kipp Brood Mariam Dollar, [facility] as Pharmacist (Pharmacist)  Indicate any recent Medical Services you may have received from other than [facility] providers in the past year (date may be approximate).     Assessment:   This is a routine wellness examination for Robert [name].  Hearing/Vision screen Hearing Screening - Comments:: No hearing difficulty Vision Screening - Comments:: Wears glasses. Followed by Dr Nicki Reaper  Dietary issues and exercise activities discussed: Exercise limited by: None  identified   Goals Addressed               This Visit's Progress     Patient stated (pt-stated)        Take care daughter       Depression Screen    10/22/2021    1:15 PM 07/21/2021    9:24 AM  10/20/2020    9:16 AM 10/20/2020    9:14 AM 07/11/2019    8:24 AM 03/13/2018    9:12 AM 07/07/2016   10:45 AM  PHQ 2/9 Scores  PHQ - 2 Score 0 0 0 0 0 0 0  PHQ- 9 Score  0         Fall Risk    10/22/2021    1:18 PM 10/18/2021    9:58 AM 07/21/2021    9:24 AM 07/19/2021    2:40 PM 04/19/2021    9:35 AM  Fall Risk   Falls in the past year? 0 0 0 0 0  Number falls in past yr: 0  0  0  Injury with Fall? 0  0  0  Risk for fall due to : No Fall Risks  No Fall Risks  No Fall Risks    FALL RISK PREVENTION PERTAINING TO THE HOME:  Any stairs in or around the home? Yes  If so, are there any without handrails? No  Home free of loose throw rugs in walkways, pet beds, electrical cords, etc? Yes  Adequate lighting in your home to reduce risk of falls? Yes   ASSISTIVE DEVICES UTILIZED TO PREVENT FALLS:  Life alert? No  Use of a cane, walker or w/c? No  Grab bars in the bathroom? Yes  Shower chair or bench in shower? Yes  Elevated toilet seat or a handicapped toilet? No   TIMED UP AND GO:  Was the test performed? No . Audio Visit  Cognitive Function:        10/22/2021    1:20 PM 07/11/2019    8:33 AM  6CIT Screen  What Year? 0 points 0 points  What month? 0 points 0 points  What time? 0 points 0 points  Count back from 20 0 points 0 points  Months in reverse 0 points 0 points  Repeat phrase 0 points 0 points  Total Score 0 points 0 points    Immunizations Immunization History  Administered Date(s) Administered   Fluad Quad(high Dose 65+) 04/03/2019, 04/19/2021   Influenza Split 03/06/2013   Influenza Whole 04/13/2007, 03/06/2009   Influenza, High Dose Seasonal PF 02/22/2016, 03/10/2017, 03/13/2018   Influenza-Unspecified 11/13/2014, 03/06/2020   PFIZER(Purple Top)SARS-COV-2 Vaccination 07/13/2019, 08/07/2019, 04/04/2020, 01/27/2021   Pneumococcal Conjugate-13 02/22/2016   Pneumococcal Polysaccharide-23 04/24/2008   Td 02/02/2018   Tdap 08/13/2010    TDAP status: Up to  date  Flu Vaccine status: Up to date  Pneumococcal vaccine status: Up to date  Covid-19 vaccine status: Completed vaccines  Qualifies for Shingles Vaccine? Yes   Zostavax completed No   Shingrix Completed?: No.    Education has been provided regarding the importance of this vaccine. Patient has been advised to call insurance company to determine out of pocket expense if they have not yet received this vaccine. Advised may also receive vaccine at local pharmacy or Health Dept. Verbalized acceptance and understanding.  Screening Tests Health Maintenance  Topic Date Due   FOOT EXAM  03/14/2019   OPHTHALMOLOGY EXAM  01/07/2020   COVID-19 Vaccine (5 - Booster for Pfizer series) 11/07/2021 (  Originally 03/24/2021)   Zoster Vaccines- Shingrix (1 of 2) 01/22/2022 (Originally 10/16/1959)   Hepatitis C Screening  10/23/2022 (Originally 10/16/1958)   INFLUENZA VACCINE  01/04/2022   HEMOGLOBIN A1C  01/18/2022   TETANUS/TDAP  02/03/2028   Pneumonia Vaccine 38+ Years old  Completed   HPV VACCINES  Aged Out    Health Maintenance  Health Maintenance Due  Topic Date Due   FOOT EXAM  03/14/2019   OPHTHALMOLOGY EXAM  01/07/2020      Lung Cancer Screening: (Low Dose CT Chest recommended if Age 43-80 years, 30 pack-year currently smoking OR have quit w/in 15years.) does not qualify.     Additional Screening:  Hepatitis C Screening: does qualify; Completed Patient deferred  Vision Screening: Recommended annual ophthalmology exams for early detection of glaucoma and other disorders of the eye. Is the patient up to date with their annual eye exam?  Yes  Who is the provider or what is the name of the office in which the patient attends annual eye exams? Dr Nicki Reaper If pt is not established with a provider, would they like to be referred to a provider to establish care? No .   Dental Screening: Recommended annual dental exams for proper oral hygiene  Community Resource Referral / Chronic Care  Management:  CRR required this visit?  No   CCM required this visit?  No      Plan:     I have personally reviewed and noted the following in the patient's chart:   Medical and social history Use of alcohol, tobacco or illicit drugs  Current medications and supplements including opioid prescriptions. Patient is currently taking opioid prescriptions. Information provided to patient regarding non-opioid alternatives. Patient advised to discuss non-opioid treatment plan with their provider. Functional ability and status Nutritional status Physical activity Advanced directives List of other physicians Hospitalizations, surgeries, and ER visits in previous 12 months Vitals Screenings to include cognitive, depression, and falls Referrals and appointments  In addition, I have reviewed and discussed with patient certain preventive protocols, quality metrics, and best practice recommendations. A written personalized care plan for preventive services as well as general preventive health recommendations were provided to patient.     Criselda Peaches, LPN   01/22/2992   Nurse Notes: None

## 2021-11-30 DIAGNOSIS — Z8546 Personal history of malignant neoplasm of prostate: Secondary | ICD-10-CM | POA: Diagnosis not present

## 2021-12-06 DIAGNOSIS — N3941 Urge incontinence: Secondary | ICD-10-CM | POA: Diagnosis not present

## 2021-12-06 DIAGNOSIS — N403 Nodular prostate with lower urinary tract symptoms: Secondary | ICD-10-CM | POA: Diagnosis not present

## 2021-12-06 DIAGNOSIS — R351 Nocturia: Secondary | ICD-10-CM | POA: Diagnosis not present

## 2022-01-12 ENCOUNTER — Other Ambulatory Visit: Payer: Self-pay

## 2022-01-12 ENCOUNTER — Telehealth: Payer: Self-pay | Admitting: Family Medicine

## 2022-01-12 MED ORDER — ATENOLOL-CHLORTHALIDONE 50-25 MG PO TABS
1.0000 | ORAL_TABLET | Freq: Every day | ORAL | 3 refills | Status: DC
  Filled 2022-06-15 – 2022-07-20 (×2): qty 90, 90d supply, fill #0
  Filled 2022-10-15: qty 90, 90d supply, fill #1

## 2022-01-12 MED ORDER — LOSARTAN POTASSIUM 100 MG PO TABS
100.0000 mg | ORAL_TABLET | Freq: Every day | ORAL | 3 refills | Status: DC
  Filled 2022-06-15 – 2022-07-20 (×2): qty 90, 90d supply, fill #0
  Filled 2022-10-15: qty 90, 90d supply, fill #1

## 2022-01-12 NOTE — Telephone Encounter (Signed)
Requesting refill of atenolol-chlorthalidone (TENORETIC) 50-25 MG tablet and losartan (COZAAR) 100 MG tablet  [facility] ([place], [place] Phone:  [phone]  Fax:  [phone]

## 2022-01-12 NOTE — Telephone Encounter (Signed)
Rx sent to pt pharmacy as requested 

## 2022-04-26 ENCOUNTER — Other Ambulatory Visit: Payer: Self-pay | Admitting: Family Medicine

## 2022-04-29 ENCOUNTER — Encounter: Payer: Self-pay | Admitting: Family Medicine

## 2022-04-29 DIAGNOSIS — E1140 Type 2 diabetes mellitus with diabetic neuropathy, unspecified: Secondary | ICD-10-CM

## 2022-04-29 DIAGNOSIS — N401 Enlarged prostate with lower urinary tract symptoms: Secondary | ICD-10-CM

## 2022-04-29 DIAGNOSIS — I10 Essential (primary) hypertension: Secondary | ICD-10-CM

## 2022-05-02 ENCOUNTER — Encounter: Payer: PPO | Admitting: Family Medicine

## 2022-05-02 ENCOUNTER — Telehealth: Payer: Self-pay

## 2022-05-02 ENCOUNTER — Other Ambulatory Visit (INDEPENDENT_AMBULATORY_CARE_PROVIDER_SITE_OTHER): Payer: PPO

## 2022-05-02 DIAGNOSIS — E1140 Type 2 diabetes mellitus with diabetic neuropathy, unspecified: Secondary | ICD-10-CM

## 2022-05-02 DIAGNOSIS — N401 Enlarged prostate with lower urinary tract symptoms: Secondary | ICD-10-CM | POA: Diagnosis not present

## 2022-05-02 DIAGNOSIS — N138 Other obstructive and reflux uropathy: Secondary | ICD-10-CM | POA: Diagnosis not present

## 2022-05-02 LAB — CBC WITH DIFFERENTIAL/PLATELET
Basophils Absolute: 0 10*3/uL (ref 0.0–0.1)
Basophils Relative: 0.5 % (ref 0.0–3.0)
Eosinophils Absolute: 0.3 10*3/uL (ref 0.0–0.7)
Eosinophils Relative: 4.1 % (ref 0.0–5.0)
HCT: 48.2 % (ref 39.0–52.0)
Hemoglobin: 15.9 g/dL (ref 13.0–17.0)
Lymphocytes Relative: 18.5 % (ref 12.0–46.0)
Lymphs Abs: 1.4 10*3/uL (ref 0.7–4.0)
MCHC: 33 g/dL (ref 30.0–36.0)
MCV: 96.3 fl (ref 78.0–100.0)
Monocytes Absolute: 0.6 10*3/uL (ref 0.1–1.0)
Monocytes Relative: 7.1 % (ref 3.0–12.0)
Neutro Abs: 5.5 10*3/uL (ref 1.4–7.7)
Neutrophils Relative %: 69.8 % (ref 43.0–77.0)
Platelets: 181 10*3/uL (ref 150.0–400.0)
RBC: 5 Mil/uL (ref 4.22–5.81)
RDW: 14.1 % (ref 11.5–15.5)
WBC: 7.8 10*3/uL (ref 4.0–10.5)

## 2022-05-02 LAB — LIPID PANEL
Cholesterol: 133 mg/dL (ref 0–200)
HDL: 37.3 mg/dL — ABNORMAL LOW (ref >39.00)
LDL Cholesterol: 74 mg/dL (ref 0–99)
NonHDL: 96.09
Total CHOL/HDL Ratio: 4
Triglycerides: 111 mg/dL (ref 0.0–149.0)
VLDL: 22.2 mg/dL (ref 0.0–40.0)

## 2022-05-02 LAB — TSH: TSH: 3.54 u[IU]/mL (ref 0.35–5.50)

## 2022-05-02 LAB — HEPATIC FUNCTION PANEL
ALT: 26 U/L (ref 0–53)
AST: 20 U/L (ref 0–37)
Albumin: 4.4 g/dL (ref 3.5–5.2)
Alkaline Phosphatase: 84 U/L (ref 39–117)
Bilirubin, Direct: 0.1 mg/dL (ref 0.0–0.3)
Total Bilirubin: 0.7 mg/dL (ref 0.2–1.2)
Total Protein: 6.8 g/dL (ref 6.0–8.3)

## 2022-05-02 LAB — BASIC METABOLIC PANEL
BUN: 21 mg/dL (ref 6–23)
CO2: 35 mEq/L — ABNORMAL HIGH (ref 19–32)
Calcium: 9.5 mg/dL (ref 8.4–10.5)
Chloride: 96 mEq/L (ref 96–112)
Creatinine, Ser: 0.98 mg/dL (ref 0.40–1.50)
GFR: 73.32 mL/min (ref >60.00)
Glucose, Bld: 142 mg/dL — ABNORMAL HIGH (ref 70–99)
Potassium: 4.6 mEq/L (ref 3.5–5.1)
Sodium: 138 mEq/L (ref 135–145)

## 2022-05-02 LAB — HEMOGLOBIN A1C: Hgb A1c MFr Bld: 6.8 % — ABNORMAL HIGH (ref 4.6–6.5)

## 2022-05-02 LAB — PSA: PSA: 0.01 ng/mL — ABNORMAL LOW (ref 0.10–4.00)

## 2022-05-02 NOTE — Telephone Encounter (Signed)
Noted  

## 2022-05-02 NOTE — Telephone Encounter (Signed)
Here for wellness labs

## 2022-05-02 NOTE — Telephone Encounter (Signed)
Pt dropped off the listed paperwork at the office this morning. Pt had original copies of paperwork and copies were sent to scanning: Living Will, [place] of Attorney and Advance Directive Regarding a Natural Death

## 2022-05-09 ENCOUNTER — Encounter: Payer: PPO | Admitting: Family Medicine

## 2022-06-08 ENCOUNTER — Ambulatory Visit (INDEPENDENT_AMBULATORY_CARE_PROVIDER_SITE_OTHER): Payer: PPO | Admitting: Family Medicine

## 2022-06-08 ENCOUNTER — Encounter: Payer: Self-pay | Admitting: Family Medicine

## 2022-06-08 VITALS — BP 126/84 | HR 55 | Temp 97.7°F | Ht 66.0 in | Wt 224.2 lb

## 2022-06-08 DIAGNOSIS — Z0000 Encounter for general adult medical examination without abnormal findings: Secondary | ICD-10-CM | POA: Diagnosis not present

## 2022-06-08 MED ORDER — MELOXICAM 15 MG PO TABS
15.0000 mg | ORAL_TABLET | Freq: Every day | ORAL | 3 refills | Status: DC
  Filled 2022-06-15 – 2022-09-02 (×2): qty 90, 90d supply, fill #0
  Filled 2022-11-29: qty 90, 90d supply, fill #1

## 2022-06-08 MED ORDER — ATORVASTATIN CALCIUM 40 MG PO TABS
40.0000 mg | ORAL_TABLET | Freq: Every day | ORAL | 3 refills | Status: DC
  Filled 2022-06-15 – 2022-09-02 (×2): qty 90, 90d supply, fill #0
  Filled 2022-10-15 – 2022-11-29 (×2): qty 90, 90d supply, fill #1

## 2022-06-08 NOTE — Progress Notes (Signed)
Subjective:    Patient ID: Robert [name], male    DOB: Dec 19, 1940, 82 y.o.   MRN: [id number]  HPI Here for a well exam. He feels well in general, although the neuropathy in his legs bothers him if he is on his feet for extended periods. His labs were drawn on 05-02-22 when his PSA was 0.01 and his A1c was 6.8%. He and his wife just returned from a cruise in the [place].    Review of Systems  Constitutional: Negative.   HENT: Negative.    Eyes: Negative.   Respiratory: Negative.    Cardiovascular: Negative.   Gastrointestinal: Negative.   Genitourinary: Negative.   Musculoskeletal: Negative.   Skin: Negative.   Neurological:  Positive for numbness.  Psychiatric/Behavioral: Negative.         Objective:   Physical Exam Constitutional:      General: He is not in acute distress.    Appearance: He is well-developed. He is obese. He is not diaphoretic.  HENT:     Head: Normocephalic and atraumatic.     Right Ear: External ear normal.     Left Ear: External ear normal.     Nose: Nose normal.     Mouth/Throat:     Pharynx: No oropharyngeal exudate.  Eyes:     General: No scleral icterus.       Right eye: No discharge.        Left eye: No discharge.     Conjunctiva/sclera: Conjunctivae normal.     Pupils: Pupils are equal, round, and reactive to light.  Neck:     Thyroid: No thyromegaly.     Vascular: No JVD.     Trachea: No tracheal deviation.  Cardiovascular:     Rate and Rhythm: Normal rate and regular rhythm.     Heart sounds: Normal heart sounds. No murmur heard.    No friction rub. No gallop.  Pulmonary:     Effort: Pulmonary effort is normal. No respiratory distress.     Breath sounds: Normal breath sounds. No wheezing or rales.  Chest:     Chest wall: No tenderness.  Abdominal:     General: Bowel sounds are normal. There is no distension.     Palpations: Abdomen is soft. There is no mass.     Tenderness: There is no abdominal tenderness. There is no  guarding or rebound.  Genitourinary:    Penis: No tenderness.   Musculoskeletal:        General: No tenderness. Normal range of motion.     Cervical back: Neck supple.  Lymphadenopathy:     Cervical: No cervical adenopathy.  Skin:    General: Skin is warm and dry.     Coloration: Skin is not pale.     Findings: No erythema or rash.  Neurological:     Mental Status: He is alert and oriented to person, place, and time.     Cranial Nerves: No cranial nerve deficit.     Motor: No abnormal muscle tone.     Coordination: Coordination normal.     Deep Tendon Reflexes: Reflexes are normal and symmetric. Reflexes normal.  Psychiatric:        Behavior: Behavior normal.        Thought Content: Thought content normal.        Judgment: Judgment normal.           Assessment & Plan:  Well exam. We discussed diet and exercise. Meds were refilled.  Annie Main  [name], MD

## 2022-06-14 ENCOUNTER — Other Ambulatory Visit (HOSPITAL_COMMUNITY): Payer: Self-pay

## 2022-06-14 DIAGNOSIS — H40013 Open angle with borderline findings, low risk, bilateral: Secondary | ICD-10-CM | POA: Diagnosis not present

## 2022-06-15 ENCOUNTER — Other Ambulatory Visit (HOSPITAL_COMMUNITY): Payer: Self-pay

## 2022-06-15 MED FILL — Potassium Chloride Tab ER 10 mEq: ORAL | 90 days supply | Qty: 90 | Fill #0 | Status: CN

## 2022-07-20 ENCOUNTER — Other Ambulatory Visit: Payer: Self-pay

## 2022-07-21 ENCOUNTER — Other Ambulatory Visit: Payer: Self-pay

## 2022-07-21 MED FILL — Potassium Chloride Tab ER 10 mEq: ORAL | 90 days supply | Qty: 90 | Fill #0 | Status: AC

## 2022-09-02 ENCOUNTER — Other Ambulatory Visit (HOSPITAL_COMMUNITY): Payer: Self-pay

## 2022-09-03 ENCOUNTER — Other Ambulatory Visit (HOSPITAL_COMMUNITY): Payer: Self-pay

## 2022-10-10 ENCOUNTER — Telehealth: Payer: Self-pay | Admitting: Family Medicine

## 2022-10-10 NOTE — Telephone Encounter (Signed)
Contacted Elmer Ramp to schedule their annual wellness visit. Appointment made for 10/25/22.  Rudell Cobb AWV direct phone # [phone]   Schedule change moved to [employer] schedule

## 2022-10-13 DIAGNOSIS — H00014 Hordeolum externum left upper eyelid: Secondary | ICD-10-CM | POA: Diagnosis not present

## 2022-10-15 ENCOUNTER — Other Ambulatory Visit: Payer: Self-pay | Admitting: Family Medicine

## 2022-10-15 ENCOUNTER — Other Ambulatory Visit (HOSPITAL_COMMUNITY): Payer: Self-pay

## 2022-10-17 ENCOUNTER — Other Ambulatory Visit (HOSPITAL_COMMUNITY): Payer: Self-pay

## 2022-10-17 ENCOUNTER — Encounter: Payer: Self-pay | Admitting: Pharmacist

## 2022-10-17 ENCOUNTER — Other Ambulatory Visit: Payer: Self-pay

## 2022-10-17 MED ORDER — POTASSIUM CHLORIDE ER 10 MEQ PO TBCR
10.0000 meq | EXTENDED_RELEASE_TABLET | Freq: Every day | ORAL | 1 refills | Status: DC
  Filled 2022-10-17: qty 90, 90d supply, fill #0
  Filled 2023-01-12: qty 90, 90d supply, fill #1

## 2022-10-25 ENCOUNTER — Ambulatory Visit (INDEPENDENT_AMBULATORY_CARE_PROVIDER_SITE_OTHER): Payer: PPO | Admitting: Family Medicine

## 2022-10-25 DIAGNOSIS — Z0000 Encounter for general adult medical examination without abnormal findings: Secondary | ICD-10-CM | POA: Diagnosis not present

## 2022-10-25 NOTE — Progress Notes (Signed)
PATIENT CHECK-IN and HEALTH RISK ASSESSMENT QUESTIONNAIRE:  -completed by phone/video for upcoming Medicare Preventive Visit  Pre-Visit Check-in: 1)Vitals (height, wt, BP, etc) - record in vitals section for visit on day of visit 2)Review and Update Medications, Allergies PMH, Surgeries, Social history in Epic 3)Hospitalizations in the last year with date/reason?  No   4)Review and Update Care Team (patient's specialists) in Epic 5) Complete PHQ9 in Epic  6) Complete Fall Screening in Epic 7)Review all Health Maintenance Due and order under PCP if not done.  Medicare Wellness Patient Questionnaire:  Answer theses question about your habits: Do you drink alcohol? No  If yes, how many drinks do you have a day?n/a  Have you ever smoked?  No  Quit date if applicable? [name]  How many packs a day do/did you smoke?  [name] Do you use smokeless tobacco? No  Do you use an illicit drugs?n/a Do you exercises? Yes IF so, what type and how many days/minutes per week?3 days at the [facility] and the others at home - walks 2 days a week, [facility], also does balance exercise Eats at home mostly - eats lots of fruits and veggies Typical breakfast varies -bacon and egg, waffles.. Typical lunch sandwhich, salad Typical dinner baked chicken green been, sweet potatoes, pork chops, salmon patties potatoes and green beans, Fridays they go out  Typical snacks none  Beverages: water, iced tea, coffee, die soda   Answer theses question about you: Can you perform most household chores? Yes  Do you find it hard to follow a conversation in a noisy room? No  Do you often ask people to speak up or repeat themselves? No  Do you feel that you have a problem with memory? No  Do you balance your checkbook and or bank acounts? No, wife does Do you feel safe at home? Yes  Last dentist visit? last month Do you need assistance with any of the following: Please note if so no   Driving?  Feeding  yourself?  Getting from bed to chair?  Getting to the toilet?  Bathing or showering?  Dressing yourself?  Managing money?  Climbing a flight of stairs  Preparing meals?    Do you have Advanced Directives in place (Living Will, Healthcare Power or Attorney)? Yes    Last eye Exam and location? Jan dr scott - twice a year.   Do you currently use prescribed or non-prescribed narcotic or opioid pain medications?no    Do you have a history or close family history of breast, ovarian, tubal or peritoneal cancer or a family member with BRCA (breast cancer susceptibility 1 and 2) gene mutations? No   Nurse/Assistant Credentials/time stamp: T. Thurmond Butts CMA 10:00 am   ----------------------------------------------------------------------------------------------------------------------------------------------------------------------------------------------------------------------    MEDICARE ANNUAL PREVENTIVE CARE VISIT WITH PROVIDER (Welcome to [facility], initial annual wellness or annual wellness exam)  Virtual Visit via Phone Note  I connected with Robert [name] on 10/25/22  by phone and verified that I am speaking with the correct person using two identifiers.  Location patient: home Location provider:work or home office Persons participating in the virtual visit: patient, provider  Concerns and/or follow up today: stable, reports is doing great. Exercises at least 5 days per week. He was not aware that he had diabetes. He has neuropathy. Reviewed significance of mild diabetes and discussed dietary recs and advised continue exercise and regular foot checks and eye exams.    See HM section in Epic for  other details of completed HM.    ROS: negative for report of fevers, unintentional weight loss, vision changes, vision loss, hearing loss or change, chest pain, sob, hemoptysis, melena, hematochezia, hematuria, falls, bleeding or bruising, thoughts of suicide or self harm, memory  loss  Patient-completed extensive health risk assessment - reviewed and discussed with the patient: See Health Risk Assessment completed with patient prior to the visit either above or in recent phone note. This was reviewed in detailed with the patient today and appropriate recommendations, orders and referrals were placed as needed per Summary below and patient instructions.   Review of Medical History: -PMH, PSH, Family History and current specialty and care providers reviewed and updated and listed below   Patient Care Team: Nelwyn Salisbury, MD as PCP - General Reinaldo Raddle, Encarnacion Slates, [facility] (Inactive) as Pharmacist (Pharmacist)   Past Medical History:  Diagnosis Date   CAD (coronary artery disease)    per pt cardiologist dr Eden Emms, last office visit 02-05-2010, currently followed by pcp , dr fry   Diverticulosis of colon    ED (erectile dysfunction) of non-organic origin    History of colon polyps    02/ 2009 benign   Hyperlipidemia    Hypertension    Nodular prostate with lower urinary tract symptoms    Prostate cancer [facility]) urologist-  dr wrenn/  oncologist-  dr Kathrynn Running   dx 06-15-2016,  Stage T2a, Gleason 3+3,  PSA 4.64,  vol 23.4cc   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 Samule Ohm   severe 2V CAD-- 90% LAD diagonal, 80% CFx ostium,  50% pRCA   COLONOSCOPY  08/03/2007   per Dr. Russella Dar, benign polyp, he declines to have any more    CORONARY ARTERY BYPASS GRAFT  05-02-2005  dr Zenaida Niece tright   LIMA to LAD,  radial graft to CFx marginal,  SVG to diagonal and RCA   EXICIOSN MASS INDEX FINGER Right 07/2015   per pt benign   PROSTATE BIOPSY  06/15/2016   RADIOACTIVE SEED IMPLANT [name] 10/20/2016   Procedure: RADIOACTIVE SEED IMPLANT/BRACHYTHERAPY IMPLANT, 71 seeds implanted, no seeds found in bladder;  Surgeon: Bjorn Pippin, MD;  Location: [facility];  Service: Urology;  Laterality: [name];    Social History   Socioeconomic History   Marital status: Married    Spouse name: Not on file   Number of children: 2   Years of education: 4 years college   Highest education level: Bachelor's degree (e.g., BA, AB, BS)  Occupational History   Occupation: retired  Tobacco Use   Smoking status: Never   Smokeless tobacco: Never  Substance and Sexual Activity   Alcohol use: No    Alcohol/week: 0.0 standard drinks of alcohol   Drug use: No   Sexual activity: Not on file  Other Topics Concern   Not on file  Social History Narrative   HH 2   Married   2 children locally   3 grandchildren   Social Determinants of Health   Financial Resource Strain: Low Risk  (10/22/2021)   Overall Financial Resource Strain (CARDIA)    Difficulty of Paying Living Expenses: Not hard at all  Food Insecurity: No Food Insecurity (10/22/2021)   Hunger Vital Sign    Worried About Running  Out of Food in the Last Year: Never true    Ran Out of Food in the Last Year: Never true  Transportation Needs: No Transportation Needs (10/22/2021)   PRAPARE - Administrator, Civil Service (Medical): No    Lack of Transportation (Non-Medical): No  Physical Activity: Sufficiently Active (10/22/2021)   Exercise Vital Sign    Days of Exercise per Week: 4 days    Minutes of Exercise per Session: 40 min  Stress: No Stress Concern Present (10/22/2021)   [employer] of Occupational Health - Occupational Stress Questionnaire    Feeling of Stress : Not at all  Social Connections: Socially Integrated (10/22/2021)   Social Connection and Isolation Panel [NHANES]    Frequency of Communication with Friends and Family: Three times a week    Frequency of Social Gatherings with Friends and Family: Three times a week    Attends Religious Services: More than 4 times per year    Active Member of Clubs or Organizations: Yes     Attends Banker Meetings: More than 4 times per year    Marital Status: Married  Catering manager Violence: Not At Risk (10/22/2021)   Humiliation, Afraid, Rape, and Kick questionnaire    Fear of Current or Ex-Partner: No    Emotionally Abused: No    Physically Abused: No    Sexually Abused: No    Family History  Problem Relation Age of Onset   Sudden death Other    Cancer Maternal Aunt        ovarian   Cancer Maternal Uncle        prostate   Cancer Maternal Grandmother        breast   Cancer Maternal Grandfather        prostate   Cancer Maternal Uncle        prostate   Cancer Maternal Uncle        prostate   Cancer Maternal Uncle        prostate    Current Outpatient Medications on File Prior to Visit  Medication Sig Dispense Refill   aspirin 81 MG tablet Take 81 mg by mouth daily.     atenolol-chlorthalidone (TENORETIC) 50-25 MG tablet Take 1 tablet by mouth daily. 90 tablet 3   atorvastatin (LIPITOR) 40 MG tablet Take 1 tablet (40 mg total) by mouth daily. 90 tablet 3   latanoprost (XALATAN) 0.005 % ophthalmic solution Place 1 drop into both eyes at bedtime.     losartan (COZAAR) 100 MG tablet Take 1 tablet (100 mg total) by mouth daily. 90 tablet 3   meloxicam (MOBIC) 15 MG tablet Take 1 tablet (15 mg total) by mouth daily. 90 tablet 3   potassium chloride (KLOR-CON) 10 MEQ tablet Take 1 tablet (10 mEq total) by mouth daily. 90 tablet 1   tamsulosin (FLOMAX) 0.4 MG CAPS capsule Take 0.4 mg by mouth daily after supper.     No current facility-administered medications on file prior to visit.    No Known Allergies     Physical Exam There were no vitals filed for this visit. Estimated body mass index is 36.19 kg/m as calculated from the following:   Height as of 06/08/22: 5\' 6"  (1.676 m).   Weight as of 06/08/22: 224 lb 3.2 oz (101.7 kg).  EKG (optional): deferred due to virtual visit  GENERAL: alert, oriented, no acute distress detected; full vision  exam deferred due to pandemic and/or virtual encounter PSYCH/NEURO: pleasant and  cooperative, no obvious depression or anxiety, speech and thought processing grossly intact, Cognitive function grossly intact  Flowsheet Row Office Visit from 07/21/2021 in [facility]  PHQ-9 Total Score 0           10/25/2022    9:54 AM 10/22/2021    1:15 PM 07/21/2021    9:24 AM 10/20/2020    9:16 AM 10/20/2020    9:14 AM  Depression screen PHQ 2/9  Decreased Interest 0 0 0 0 0  Down, Depressed, Hopeless 0 0 0 0 0  PHQ - 2 Score 0 0 0 0 0  Altered sleeping   0    Tired, decreased energy   0    Change in appetite   0    Feeling bad or failure about yourself    0    Trouble concentrating   0    Moving slowly or fidgety/restless   0    Suicidal thoughts   0    PHQ-9 Score   0    Difficult doing work/chores   Not difficult at all         07/21/2021    9:24 AM 10/18/2021    9:58 AM 10/22/2021    1:18 PM 10/18/2022    2:44 PM 10/25/2022    9:53 AM  Fall Risk  Falls in the past year? 0 0 0 0 0  Was there an injury with Fall? 0  0  0  Fall Risk Category Calculator 0  0  0  Fall Risk Category (Retired) Low  Low    (RETIRED) Patient Fall Risk Level Low fall risk  Low fall risk    Patient at Risk for Falls Due to No Fall Risks  No Fall Risks  No Fall Risks  Fall risk Follow up     Falls evaluation completed     SUMMARY AND PLAN:  Encounter for Medicare annual wellness exam    Discussed applicable health maintenance/preventive health measures and advised and referred or ordered per patient preferences: -he was not aware he had mild diabetes - discussed significance and lifestyle changes for diabetes -he can get foot exam, kidney check with PCP if desired -reports he gets eye exam twice per year - advise nurse staff to obtain report/abstract -advised of recs for covid boosters, advised can get at pharmacy. Health Maintenance  Topic Date Due   Diabetic kidney  evaluation - Urine ACR  03/28/2015   FOOT EXAM  03/14/2019   OPHTHALMOLOGY EXAM  01/07/2020   COVID-19 Vaccine (6 - 2023-24 season) 06/29/2022   HEMOGLOBIN A1C  10/31/2022   INFLUENZA VACCINE  01/05/2023   Diabetic kidney evaluation - eGFR measurement  05/03/2023   Medicare Annual Wellness (AWV)  10/25/2023   DTaP/Tdap/Td (3 - Td or Tdap) 02/03/2028   Pneumonia Vaccine 102+ Years old  Completed   Zoster Vaccines- Shingrix  Completed   HPV VACCINES  Aged [employer] and counseling on the following was provided based on the above review of health and a plan/checklist for the patient, along with additional information discussed, was provided for the patient in the patient instructions :   -Provided safe balance exercises in patient instructions that can be done at home to improve balance and discussed exercise guidelines for adults with include balance exercises at least 3 days per week.  -Advised and counseled on a healthy lifestyle - including the importance of a healthy diet, regular physical activity, social connections and stress management. -  Reviewed patient's current diet. Advised and counseled on a whole foods based healthy diet and specific dietary recommendation for diabetes - avoidance of added sugar and simple starches and consumption of plenty of nutrient rich plant foods. A summary of a healthy diet was provided in the Patient Instructions.  -reviewed patient's current physical activity level and discussed exercise guidelines for adults. Discussed community resources and ideas for safe exercise at home to assist in meeting exercise guideline recommendations in a safe and healthy way.  -Advise yearly dental visits at minimum and regular eye exams   Follow up: see patient instructions   Patient Instructions  I really enjoyed getting to talk with you today! I am available on Tuesdays and Thursdays for virtual visits if you have any questions or concerns, or if I can be of any  further assistance.   CHECKLIST FROM ANNUAL WELLNESS VISIT:  -Follow up (please call to schedule if not scheduled after visit):   -yearly for annual wellness visit with primary care office  Here is a list of your preventive care/health maintenance measures and the plan for each if any are due:  PLAN For any measures below that may be due:  -can do the foot exam and kidney exam with Dr. Clent Ridges if you wish -can get the covid boosters at the pharmacy  Health Maintenance  Topic Date Due   Diabetic kidney evaluation - Urine ACR  03/28/2015   FOOT EXAM  03/14/2019   OPHTHALMOLOGY EXAM  01/07/2020   COVID-19 Vaccine (6 - 2023-24 season) 06/29/2022   HEMOGLOBIN A1C  10/31/2022   INFLUENZA VACCINE  01/05/2023   Diabetic kidney evaluation - eGFR measurement  05/03/2023   Medicare Annual Wellness (AWV)  10/25/2023   DTaP/Tdap/Td (3 - Td or Tdap) 02/03/2028   Pneumonia Vaccine 34+ Years old  Completed   Zoster Vaccines- Shingrix  Completed   HPV VACCINES  Aged Out    -See a dentist at least yearly  -Get your eyes checked and then per your eye specialist's recommendations  -Other issues addressed today:   -I have included below further information regarding a healthy whole foods based diet, physical activity guidelines for adults, stress management and opportunities for social connections. I hope you find this information useful.   -----------------------------------------------------------------------------------------------------------------------------------------------------------------------------------------------------------------------------------------------------------  NUTRITION: -eat real food: lots of colorful vegetables (half the plate) and fruits -5-7 servings of vegetables and fruits per day (fresh or steamed is best), exp. 2 servings of vegetables with lunch and dinner and 2 servings of fruit per day. Berries and greens such as kale and collards are great choices.   -consume on a regular basis: whole grains (make sure first ingredient on label contains the word "whole"), fresh fruits, fish, nuts, seeds, healthy oils (such as olive oil, avocado oil, grape seed oil) -may eat small amounts of dairy and lean meat on occasion, but avoid processed meats such as ham, bacon, lunch meat, etc. -drink water -try to avoid fast food and pre-packaged foods, processed meat -most experts advise limiting sodium to < 2300mg  per day, should limit further is any chronic conditions such as high blood pressure, heart disease, diabetes, etc. The American Heart Association advised that < 1500mg  is is ideal -try to avoid foods that contain any ingredients with names you do not recognize  -try to avoid sugar/sweets (except for the natural sugar that occurs in fresh fruit) -try to avoid sweet drinks -try to avoid white rice, white bread, pasta (unless whole grain), white or yellow potatoes  EXERCISE GUIDELINES FOR ADULTS: -if you wish to increase your physical activity, do so gradually and with the approval of your doctor -STOP and seek medical care immediately if you have any chest pain, chest discomfort or trouble breathing when starting or increasing exercise  -move and stretch your body, legs, feet and arms when sitting for long periods -Physical activity guidelines for optimal health in adults: -least 150 minutes per week of aerobic exercise (can talk, but not sing) once approved by your doctor, 20-30 minutes of sustained activity or two 10 minute episodes of sustained activity every day.  -resistance training at least 2 days per week if approved by your doctor -balance exercises 3+ days per week:   Stand somewhere where you have something sturdy to hold onto if you lose balance.    1) lift up on toes, start with 5x per day and work up to 20x   2) stand and lift on leg straight out to the side so that foot is a few inches of the floor, start with 5x each side and work up to 20x  each side   3) stand on one foot, start with 5 seconds each side and work up to 20 seconds on each side  If you need ideas or help with getting more active:  -Silver sneakers https://tools.silversneakers.com  -Walk with a Doc: http://www.duncan-williams.com/  -try to include resistance (weight lifting/strength building) and balance exercises twice per week: or the following link for ideas: http://castillo-powell.com/  BuyDucts.dk  STRESS MANAGEMENT: -can try meditating, or just sitting quietly with deep breathing while intentionally relaxing all parts of your body for 5 minutes daily -if you need further help with stress, anxiety or depression please follow up with your primary doctor or contact the wonderful folks at [facility]: [phone]  SOCIAL CONNECTIONS: -options in [place] if you wish to engage in more social and exercise related activities:  -Silver sneakers https://tools.silversneakers.com  -Walk with a Doc: http://www.duncan-williams.com/  -Check out the [facility] Active Adults 50+ section on the [place] [employer] (hiking clubs, book clubs, cards and games, chess, exercise classes, aquatic classes and much more) - see the website for details: https://www.[place].gov/departments/parks-recreation/active-adults50  -YouTube has lots of exercise videos for different ages and abilities as well  -[facility] (a variety of indoor and outdoor inperson activities for adults). (281)[phone]. [address]  -Virtual Online Classes (a variety of topics): see seniorplanet.org or call [phone]  -consider volunteering at a school, hospice center, church, senior center or elsewhere           Terressa Koyanagi, DO

## 2022-10-25 NOTE — Patient Instructions (Addendum)
I really enjoyed getting to talk with you today! I am available on Tuesdays and Thursdays for virtual visits if you have any questions or concerns, or if I can be of any further assistance.   CHECKLIST FROM ANNUAL WELLNESS VISIT:  -Follow up (please call to schedule if not scheduled after visit):   -yearly for annual wellness visit with primary care office  Here is a list of your preventive care/health maintenance measures and the plan for each if any are due:  PLAN For any measures below that may be due:  -can do the foot exam and kidney exam with Dr. Clent Ridges if you wish -can get the covid boosters at the pharmacy  Health Maintenance  Topic Date Due   Diabetic kidney evaluation - Urine ACR  03/28/2015   FOOT EXAM  03/14/2019   OPHTHALMOLOGY EXAM  01/07/2020   COVID-19 Vaccine (6 - 2023-24 season) 06/29/2022   HEMOGLOBIN A1C  10/31/2022   INFLUENZA VACCINE  01/05/2023   Diabetic kidney evaluation - eGFR measurement  05/03/2023   Medicare Annual Wellness (AWV)  10/25/2023   DTaP/Tdap/Td (3 - Td or Tdap) 02/03/2028   Pneumonia Vaccine 21+ Years old  Completed   Zoster Vaccines- Shingrix  Completed   HPV VACCINES  Aged Out    -See a dentist at least yearly  -Get your eyes checked and then per your eye specialist's recommendations  -Other issues addressed today:   -I have included below further information regarding a healthy whole foods based diet, physical activity guidelines for adults, stress management and opportunities for social connections. I hope you find this information useful.   -----------------------------------------------------------------------------------------------------------------------------------------------------------------------------------------------------------------------------------------------------------  NUTRITION: -eat real food: lots of colorful vegetables (half the plate) and fruits -5-7 servings of vegetables and fruits per day (fresh or  steamed is best), exp. 2 servings of vegetables with lunch and dinner and 2 servings of fruit per day. Berries and greens such as kale and collards are great choices.  -consume on a regular basis: whole grains (make sure first ingredient on label contains the word "whole"), fresh fruits, fish, nuts, seeds, healthy oils (such as olive oil, avocado oil, grape seed oil) -may eat small amounts of dairy and lean meat on occasion, but avoid processed meats such as ham, bacon, lunch meat, etc. -drink water -try to avoid fast food and pre-packaged foods, processed meat -most experts advise limiting sodium to < 2300mg  per day, should limit further is any chronic conditions such as high blood pressure, heart disease, diabetes, etc. The American Heart Association advised that < 1500mg  is is ideal -try to avoid foods that contain any ingredients with names you do not recognize  -try to avoid sugar/sweets (except for the natural sugar that occurs in fresh fruit) -try to avoid sweet drinks -try to avoid white rice, white bread, pasta (unless whole grain), white or yellow potatoes  EXERCISE GUIDELINES FOR ADULTS: -if you wish to increase your physical activity, do so gradually and with the approval of your doctor -STOP and seek medical care immediately if you have any chest pain, chest discomfort or trouble breathing when starting or increasing exercise  -move and stretch your body, legs, feet and arms when sitting for long periods -Physical activity guidelines for optimal health in adults: -least 150 minutes per week of aerobic exercise (can talk, but not sing) once approved by your doctor, 20-30 minutes of sustained activity or two 10 minute episodes of sustained activity every day.  -resistance training at least 2 days per week  if approved by your doctor -balance exercises 3+ days per week:   Stand somewhere where you have something sturdy to hold onto if you lose balance.    1) lift up on toes, start with  5x per day and work up to 20x   2) stand and lift on leg straight out to the side so that foot is a few inches of the floor, start with 5x each side and work up to 20x each side   3) stand on one foot, start with 5 seconds each side and work up to 20 seconds on each side  If you need ideas or help with getting more active:  -Silver sneakers https://tools.silversneakers.com  -Walk with a Doc: http://www.duncan-williams.com/  -try to include resistance (weight lifting/strength building) and balance exercises twice per week: or the following link for ideas: http://castillo-powell.com/  BuyDucts.dk  STRESS MANAGEMENT: -can try meditating, or just sitting quietly with deep breathing while intentionally relaxing all parts of your body for 5 minutes daily -if you need further help with stress, anxiety or depression please follow up with your primary doctor or contact the wonderful folks at [facility]: [phone]  SOCIAL CONNECTIONS: -options in [place] if you wish to engage in more social and exercise related activities:  -Silver sneakers https://tools.silversneakers.com  -Walk with a Doc: http://www.duncan-williams.com/  -Check out the [facility] Active Adults 50+ section on the [place] [employer] (hiking clubs, book clubs, cards and games, chess, exercise classes, aquatic classes and much more) - see the website for details: https://www.[place]-.gov/departments/parks-recreation/active-adults50  -YouTube has lots of exercise videos for different ages and abilities as well  -[facility] (a variety of indoor and outdoor inperson activities for adults). (617)211-0098. [address].  -Virtual Online Classes (a variety of topics): see seniorplanet.org or call [phone]  -consider volunteering at a school, hospice center, church, senior center or  elsewhere

## 2022-11-29 ENCOUNTER — Other Ambulatory Visit (HOSPITAL_COMMUNITY): Payer: Self-pay

## 2022-12-15 ENCOUNTER — Other Ambulatory Visit (INDEPENDENT_AMBULATORY_CARE_PROVIDER_SITE_OTHER): Payer: PPO

## 2022-12-15 ENCOUNTER — Ambulatory Visit: Payer: PPO | Admitting: Orthopaedic Surgery

## 2022-12-15 DIAGNOSIS — M25512 Pain in left shoulder: Secondary | ICD-10-CM

## 2022-12-15 NOTE — Progress Notes (Signed)
The patient is an 82 year old gentleman who had a mechanical fall 2 nights ago when his left knee gave out on him.  He has been wearing a knee sleeve and is getting around better.  He does not walk with a cane but he may need to go on occasion.  He injured his left shoulder and he was having left shoulder pain but is feeling little bit better today.  On exam he does seem to have some deficits of his rotator cuff on both shoulders but his left shoulder is well located in his range of motion is actually really good with some slight weakness on both shoulders.  There is no significant bruising around the shoulder on the left side.  X-rays of the left shoulder show no acute findings.  The humeral head is well located and there is no evidence of fracture.  The humeral head is not high riding.  Certainly he likely does have some chronic rotator cuff deficiencies of both shoulders.  Right now would not recommend anything other than conservative care and anti-inflammatories as well as rest and using the shoulder as comfort allows.  If it does keep bothering him into the next week or so we can always see him back and even provide a steroid injection in the shoulder and even consider some therapy if needed.  He should continue to wear knee sleeve for support of his knee.

## 2022-12-19 ENCOUNTER — Other Ambulatory Visit: Payer: Self-pay

## 2022-12-19 ENCOUNTER — Encounter: Payer: Self-pay | Admitting: Family Medicine

## 2022-12-19 ENCOUNTER — Other Ambulatory Visit (HOSPITAL_COMMUNITY): Payer: Self-pay

## 2022-12-19 ENCOUNTER — Ambulatory Visit: Payer: PPO | Admitting: Family Medicine

## 2022-12-19 VITALS — BP 150/78 | HR 55 | Temp 98.2°F | Wt 218.0 lb

## 2022-12-19 DIAGNOSIS — I10 Essential (primary) hypertension: Secondary | ICD-10-CM | POA: Diagnosis not present

## 2022-12-19 MED ORDER — HYDROCHLOROTHIAZIDE 25 MG PO TABS
25.0000 mg | ORAL_TABLET | Freq: Every day | ORAL | 3 refills | Status: DC
  Filled 2022-12-19: qty 30, 30d supply, fill #0

## 2022-12-19 MED ORDER — AMLODIPINE BESYLATE 5 MG PO TABS
5.0000 mg | ORAL_TABLET | Freq: Every day | ORAL | 3 refills | Status: DC
  Filled 2022-12-19: qty 30, 30d supply, fill #0

## 2022-12-19 NOTE — Progress Notes (Signed)
   Subjective:    Patient ID: Robert [name], male    DOB: 07-21-1940, 82 y.o.   MRN: [id number]  HPI Here for high BP readings. He has felt fine, and he still works out at [employer] regularly. His BP was fine when he was here in January, but at the orthopedics office last week it was 170/80. He has been checking it since then, and this has averaged in the 160's -180's over 70's.    Review of Systems  Constitutional: Negative.   Respiratory: Negative.    Cardiovascular: Negative.   Neurological: Negative.        Objective:   Physical Exam Constitutional:      Appearance: Normal appearance.  Cardiovascular:     Rate and Rhythm: Normal rate and regular rhythm.     Pulses: Normal pulses.  Pulmonary:     Effort: Pulmonary effort is normal.     Breath sounds: Normal breath sounds.  Musculoskeletal:     Right lower leg: No edema.     Left lower leg: No edema.  Neurological:     Mental Status: He is alert.           Assessment & Plan:  His HTN is not well controlled. We will stop the Atenolol-Chlorthalidone and we will add hydrochlorothiazide 25 mg daily and Amlodipine 5 mg daily. Recheck in 3 weeks.  Gershon Crane, MD

## 2022-12-21 ENCOUNTER — Ambulatory Visit: Payer: PPO | Admitting: Orthopaedic Surgery

## 2022-12-27 ENCOUNTER — Ambulatory Visit: Payer: PPO | Admitting: Orthopaedic Surgery

## 2022-12-27 ENCOUNTER — Encounter: Payer: Self-pay | Admitting: Orthopaedic Surgery

## 2022-12-27 DIAGNOSIS — M25512 Pain in left shoulder: Secondary | ICD-10-CM | POA: Diagnosis not present

## 2022-12-27 MED ORDER — LIDOCAINE HCL 1 % IJ SOLN
3.0000 mL | INTRAMUSCULAR | Status: AC | PRN
Start: 2022-12-27 — End: 2022-12-27
  Administered 2022-12-27: 3 mL

## 2022-12-27 MED ORDER — METHYLPREDNISOLONE ACETATE 40 MG/ML IJ SUSP
40.0000 mg | INTRAMUSCULAR | Status: AC | PRN
Start: 2022-12-27 — End: 2022-12-27
  Administered 2022-12-27: 40 mg via INTRA_ARTICULAR

## 2022-12-27 NOTE — Progress Notes (Signed)
          +                                                                                                                  Procedure Note  Patient: Robert [name]             Date of Birth: 1941-06-04           MRN: [id number]             Visit Date: 12/27/2022  HPI: Mr. Dermody returns today states that his shoulder pain is improved but he still has shoulder pain with certain movements.  Notes that it does not occur all the time though.  He denies any acute numbness tingling down the arm.  He does have some neuropathy involving both the left upper extremity and lower extremity.  He is diabetic hemoglobin A1c 7 months ago was 6.8.  Denies any fevers chills.  Review of systems: See HPI otherwise negative  Physical exam: General well-developed well-nourished male who ambulates without any assistive device. Psych: Alert and oriented x 3 Bilateral shoulders: 5 out of 5 strength with external and internal rotation against resistance.  Empty can test negative bilaterally.  Impingement testing positive on the left.  Liftoff test is negative bilaterally.  Some stiffness with overhead motion with forward flexion and abduction bilateral shoulders he is able to come to the 180 degrees bilaterally.   Procedures: Visit Diagnoses:  1. Acute pain of left shoulder     Large Joint Inj: L subacromial bursa on 12/27/2022 9:27 AM Indications: pain Details: 22 G 1.5 in needle, superior approach  Arthrogram: No  Medications: 3 mL lidocaine 1 %; 40 mg methylPREDNISolone acetate 40 MG/ML Outcome: tolerated well, no immediate complications Procedure, treatment alternatives, risks and benefits explained, specific risks discussed. Consent was given by the patient. Immediately prior to procedure a time out was called to verify the correct patient, procedure, equipment, support staff and  site/side marked as required. Patient was prepped and draped in the usual sterile fashion.    Plan: He shown forward flexion exercises, wall crawls, Codman and pendulum.  He will perform these every other days instructed.  Follow-up with [place] as needed pain persist or becomes worse.  Questions encouraged and answered at length.

## 2023-01-02 DIAGNOSIS — C61 Malignant neoplasm of prostate: Secondary | ICD-10-CM | POA: Diagnosis not present

## 2023-01-09 ENCOUNTER — Other Ambulatory Visit (HOSPITAL_COMMUNITY): Payer: Self-pay

## 2023-01-09 ENCOUNTER — Encounter: Payer: Self-pay | Admitting: Family Medicine

## 2023-01-09 ENCOUNTER — Ambulatory Visit (INDEPENDENT_AMBULATORY_CARE_PROVIDER_SITE_OTHER): Payer: PPO | Admitting: Family Medicine

## 2023-01-09 VITALS — BP 138/74 | HR 107 | Temp 98.1°F | Wt 218.0 lb

## 2023-01-09 DIAGNOSIS — R351 Nocturia: Secondary | ICD-10-CM | POA: Diagnosis not present

## 2023-01-09 DIAGNOSIS — I10 Essential (primary) hypertension: Secondary | ICD-10-CM

## 2023-01-09 DIAGNOSIS — Z8546 Personal history of malignant neoplasm of prostate: Secondary | ICD-10-CM | POA: Diagnosis not present

## 2023-01-09 DIAGNOSIS — N403 Nodular prostate with lower urinary tract symptoms: Secondary | ICD-10-CM | POA: Diagnosis not present

## 2023-01-09 MED ORDER — AMLODIPINE BESYLATE 10 MG PO TABS
10.0000 mg | ORAL_TABLET | Freq: Every day | ORAL | 2 refills | Status: DC
  Filled 2023-01-09: qty 30, 30d supply, fill #0
  Filled 2023-02-07: qty 30, 30d supply, fill #1

## 2023-01-09 NOTE — Progress Notes (Signed)
   Subjective:    Patient ID: Robert [name], male    DOB: 05-27-1941, 82 y.o.   MRN: [id number]  HPI Here to follow up on BP. His BP at home has been better controlled. He has beenn averaging in the 130s over 70s. He does says that he has to urinate more frequently and this has been a problem.    Review of Systems  Constitutional: Negative.   Respiratory: Negative.    Cardiovascular: Negative.   Genitourinary:  Positive for frequency. Negative for dysuria, flank pain and urgency.       Objective:   Physical Exam Constitutional:      Appearance: Normal appearance.  Cardiovascular:     Rate and Rhythm: Normal rate and regular rhythm.     Pulses: Normal pulses.     Heart sounds: Normal heart sounds.  Pulmonary:     Effort: Pulmonary effort is normal.     Breath sounds: Normal breath sounds.  Neurological:     Mental Status: He is alert.           Assessment & Plan:  HTN. We will stop the hydrochlorothiazide and will increase the Amlodipine to 10 mg daily. Recheck in 2 weeks.  Gershon Crane, MD

## 2023-01-12 ENCOUNTER — Other Ambulatory Visit: Payer: Self-pay

## 2023-01-12 ENCOUNTER — Other Ambulatory Visit (HOSPITAL_COMMUNITY): Payer: Self-pay

## 2023-01-12 ENCOUNTER — Other Ambulatory Visit: Payer: Self-pay | Admitting: Family Medicine

## 2023-01-12 MED ORDER — LOSARTAN POTASSIUM 100 MG PO TABS
100.0000 mg | ORAL_TABLET | Freq: Every day | ORAL | 3 refills | Status: DC
  Filled 2023-01-12: qty 90, 90d supply, fill #0
  Filled 2023-02-07 – 2023-04-12 (×2): qty 90, 90d supply, fill #1
  Filled 2023-07-23: qty 90, 90d supply, fill #2

## 2023-01-30 ENCOUNTER — Encounter: Payer: Self-pay | Admitting: Family Medicine

## 2023-02-01 NOTE — Telephone Encounter (Signed)
Tel him to go back to the 5 mg dose of Amlodipine daily (he can cut a 10 in half). Report back in 2 weeks

## 2023-02-04 DIAGNOSIS — R300 Dysuria: Secondary | ICD-10-CM | POA: Diagnosis not present

## 2023-02-04 DIAGNOSIS — N3001 Acute cystitis with hematuria: Secondary | ICD-10-CM | POA: Diagnosis not present

## 2023-02-07 ENCOUNTER — Other Ambulatory Visit (HOSPITAL_COMMUNITY): Payer: Self-pay

## 2023-02-08 ENCOUNTER — Other Ambulatory Visit: Payer: Self-pay

## 2023-02-25 DIAGNOSIS — R1032 Left lower quadrant pain: Secondary | ICD-10-CM | POA: Diagnosis not present

## 2023-02-25 DIAGNOSIS — R8290 Unspecified abnormal findings in urine: Secondary | ICD-10-CM | POA: Diagnosis not present

## 2023-02-27 ENCOUNTER — Inpatient Hospital Stay (HOSPITAL_COMMUNITY)
Admission: EM | Admit: 2023-02-27 | Discharge: 2023-03-01 | DRG: 871 | Disposition: A | Discharge status: Home / Self Care | Payer: PPO | Attending: Internal Medicine | Admitting: Internal Medicine

## 2023-02-27 ENCOUNTER — Encounter: Payer: Self-pay | Admitting: Family Medicine

## 2023-02-27 ENCOUNTER — Other Ambulatory Visit: Payer: Self-pay

## 2023-02-27 ENCOUNTER — Emergency Department (HOSPITAL_COMMUNITY): Payer: PPO

## 2023-02-27 ENCOUNTER — Encounter (HOSPITAL_COMMUNITY): Payer: Self-pay

## 2023-02-27 DIAGNOSIS — R0902 Hypoxemia: Secondary | ICD-10-CM | POA: Diagnosis present

## 2023-02-27 DIAGNOSIS — R9431 Abnormal electrocardiogram [ECG] [EKG]: Secondary | ICD-10-CM | POA: Diagnosis not present

## 2023-02-27 DIAGNOSIS — A4189 Other specified sepsis: Secondary | ICD-10-CM | POA: Diagnosis not present

## 2023-02-27 DIAGNOSIS — Z803 Family history of malignant neoplasm of breast: Secondary | ICD-10-CM

## 2023-02-27 DIAGNOSIS — J9811 Atelectasis: Secondary | ICD-10-CM | POA: Diagnosis not present

## 2023-02-27 DIAGNOSIS — R0609 Other forms of dyspnea: Secondary | ICD-10-CM | POA: Diagnosis not present

## 2023-02-27 DIAGNOSIS — Z951 Presence of aortocoronary bypass graft: Secondary | ICD-10-CM | POA: Diagnosis not present

## 2023-02-27 DIAGNOSIS — R531 Weakness: Secondary | ICD-10-CM | POA: Diagnosis not present

## 2023-02-27 DIAGNOSIS — N400 Enlarged prostate without lower urinary tract symptoms: Secondary | ICD-10-CM | POA: Diagnosis not present

## 2023-02-27 DIAGNOSIS — R0989 Other specified symptoms and signs involving the circulatory and respiratory systems: Secondary | ICD-10-CM | POA: Diagnosis not present

## 2023-02-27 DIAGNOSIS — Z79899 Other long term (current) drug therapy: Secondary | ICD-10-CM

## 2023-02-27 DIAGNOSIS — Z6835 Body mass index (BMI) 35.0-35.9, adult: Secondary | ICD-10-CM | POA: Diagnosis not present

## 2023-02-27 DIAGNOSIS — R0603 Acute respiratory distress: Secondary | ICD-10-CM | POA: Diagnosis not present

## 2023-02-27 DIAGNOSIS — A419 Sepsis, unspecified organism: Secondary | ICD-10-CM | POA: Diagnosis present

## 2023-02-27 DIAGNOSIS — I10 Essential (primary) hypertension: Secondary | ICD-10-CM | POA: Diagnosis not present

## 2023-02-27 DIAGNOSIS — Z8041 Family history of malignant neoplasm of ovary: Secondary | ICD-10-CM | POA: Diagnosis not present

## 2023-02-27 DIAGNOSIS — Z8546 Personal history of malignant neoplasm of prostate: Secondary | ICD-10-CM | POA: Diagnosis not present

## 2023-02-27 DIAGNOSIS — Z8042 Family history of malignant neoplasm of prostate: Secondary | ICD-10-CM

## 2023-02-27 DIAGNOSIS — M7989 Other specified soft tissue disorders: Secondary | ICD-10-CM | POA: Diagnosis not present

## 2023-02-27 DIAGNOSIS — Z8719 Personal history of other diseases of the digestive system: Secondary | ICD-10-CM | POA: Diagnosis not present

## 2023-02-27 DIAGNOSIS — U071 COVID-19: Secondary | ICD-10-CM | POA: Diagnosis not present

## 2023-02-27 DIAGNOSIS — Z791 Long term (current) use of non-steroidal anti-inflammatories (NSAID): Secondary | ICD-10-CM

## 2023-02-27 DIAGNOSIS — I2510 Atherosclerotic heart disease of native coronary artery without angina pectoris: Secondary | ICD-10-CM | POA: Diagnosis not present

## 2023-02-27 DIAGNOSIS — E669 Obesity, unspecified: Secondary | ICD-10-CM | POA: Diagnosis present

## 2023-02-27 DIAGNOSIS — Z87440 Personal history of urinary (tract) infections: Secondary | ICD-10-CM | POA: Diagnosis not present

## 2023-02-27 DIAGNOSIS — J90 Pleural effusion, not elsewhere classified: Secondary | ICD-10-CM | POA: Diagnosis not present

## 2023-02-27 DIAGNOSIS — R609 Edema, unspecified: Secondary | ICD-10-CM | POA: Diagnosis not present

## 2023-02-27 DIAGNOSIS — Z7982 Long term (current) use of aspirin: Secondary | ICD-10-CM | POA: Diagnosis not present

## 2023-02-27 DIAGNOSIS — R600 Localized edema: Secondary | ICD-10-CM | POA: Diagnosis not present

## 2023-02-27 DIAGNOSIS — R9389 Abnormal findings on diagnostic imaging of other specified body structures: Secondary | ICD-10-CM | POA: Diagnosis not present

## 2023-02-27 DIAGNOSIS — E785 Hyperlipidemia, unspecified: Secondary | ICD-10-CM | POA: Diagnosis not present

## 2023-02-27 DIAGNOSIS — E119 Type 2 diabetes mellitus without complications: Secondary | ICD-10-CM | POA: Diagnosis not present

## 2023-02-27 DIAGNOSIS — R509 Fever, unspecified: Secondary | ICD-10-CM | POA: Diagnosis not present

## 2023-02-27 LAB — CBC WITH DIFFERENTIAL/PLATELET
Abs Immature Granulocytes: 0.04 10*3/uL (ref 0.00–0.07)
Basophils Absolute: 0 10*3/uL (ref 0.0–0.1)
Basophils Relative: 0 %
Eosinophils Absolute: 0.1 10*3/uL (ref 0.0–0.5)
Eosinophils Relative: 1 %
HCT: 45.1 % (ref 39.0–52.0)
Hemoglobin: 14.2 g/dL (ref 13.0–17.0)
Immature Granulocytes: 1 %
Lymphocytes Relative: 6 %
Lymphs Abs: 0.5 10*3/uL — ABNORMAL LOW (ref 0.7–4.0)
MCH: 31.3 pg (ref 26.0–34.0)
MCHC: 31.5 g/dL (ref 30.0–36.0)
MCV: 99.6 fL (ref 80.0–100.0)
Monocytes Absolute: 0.4 10*3/uL (ref 0.1–1.0)
Monocytes Relative: 5 %
Neutro Abs: 7.7 10*3/uL (ref 1.7–7.7)
Neutrophils Relative %: 87 %
Platelets: 159 10*3/uL (ref 150–400)
RBC: 4.53 MIL/uL (ref 4.22–5.81)
RDW: 13.7 % (ref 11.5–15.5)
WBC: 8.8 10*3/uL (ref 4.0–10.5)
nRBC: 0 % (ref 0.0–0.2)

## 2023-02-27 LAB — COMPREHENSIVE METABOLIC PANEL
ALT: 31 U/L (ref 0–44)
AST: 26 U/L (ref 15–41)
Albumin: 3.3 g/dL — ABNORMAL LOW (ref 3.5–5.0)
Alkaline Phosphatase: 86 U/L (ref 38–126)
Anion gap: 9 (ref 5–15)
BUN: 24 mg/dL — ABNORMAL HIGH (ref 8–23)
CO2: 25 mmol/L (ref 22–32)
Calcium: 8.8 mg/dL — ABNORMAL LOW (ref 8.9–10.3)
Chloride: 100 mmol/L (ref 98–111)
Creatinine, Ser: 1.08 mg/dL (ref 0.61–1.24)
GFR, Estimated: >60 mL/min (ref >60)
Glucose, Bld: 120 mg/dL — ABNORMAL HIGH (ref 70–99)
Potassium: 3.6 mmol/L (ref 3.5–5.1)
Sodium: 134 mmol/L — ABNORMAL LOW (ref 135–145)
Total Bilirubin: 1.1 mg/dL (ref 0.3–1.2)
Total Protein: 7.1 g/dL (ref 6.5–8.1)

## 2023-02-27 LAB — APTT: aPTT: 33 seconds (ref 24–36)

## 2023-02-27 LAB — URINALYSIS, W/ REFLEX TO CULTURE (INFECTION SUSPECTED)
Bilirubin Urine: NEGATIVE
Glucose, UA: NEGATIVE mg/dL
Ketones, ur: NEGATIVE mg/dL
Leukocytes,Ua: NEGATIVE
Nitrite: NEGATIVE
Protein, ur: 30 mg/dL — AB
Specific Gravity, Urine: 1.018 (ref 1.005–1.030)
pH: 5 (ref 5.0–8.0)

## 2023-02-27 LAB — CBG MONITORING, ED: Glucose-Capillary: 127 mg/dL — ABNORMAL HIGH (ref 70–99)

## 2023-02-27 LAB — BRAIN NATRIURETIC PEPTIDE: B Natriuretic Peptide: 165.8 pg/mL — ABNORMAL HIGH (ref 0.0–100.0)

## 2023-02-27 LAB — C-REACTIVE PROTEIN: CRP: 23.6 mg/dL — ABNORMAL HIGH (ref <1.0)

## 2023-02-27 LAB — TROPONIN I (HIGH SENSITIVITY)
Troponin I (High Sensitivity): 21 ng/L — ABNORMAL HIGH (ref <18)
Troponin I (High Sensitivity): 18 ng/L — ABNORMAL HIGH (ref <18)

## 2023-02-27 LAB — RESP PANEL BY RT-PCR (RSV, FLU A&B, COVID)  RVPGX2
Influenza A by PCR: NEGATIVE
Influenza B by PCR: NEGATIVE
Resp Syncytial Virus by PCR: NEGATIVE
SARS Coronavirus 2 by RT PCR: POSITIVE — AB

## 2023-02-27 LAB — PROTIME-INR
INR: 1.1 (ref 0.8–1.2)
Prothrombin Time: 14.4 seconds (ref 11.4–15.2)

## 2023-02-27 LAB — I-STAT CG4 LACTIC ACID, ED
Lactic Acid, Venous: 1.6 mmol/L (ref 0.5–1.9)
Lactic Acid, Venous: 0.6 mmol/L (ref 0.5–1.9)

## 2023-02-27 LAB — PROCALCITONIN: Procalcitonin: 1.4 ng/mL

## 2023-02-27 MED ORDER — GUAIFENESIN 100 MG/5ML PO LIQD: 5.0000 mL | ORAL | Status: DC | PRN

## 2023-02-27 MED ORDER — SODIUM CHLORIDE 0.9 % IV BOLUS
1000.0000 mL | Freq: Once | INTRAVENOUS | Status: AC
  Administered 2023-02-27: 1000 mL via INTRAVENOUS

## 2023-02-27 MED ORDER — ACETAMINOPHEN 325 MG PO TABS: 650.0000 mg | ORAL_TABLET | Freq: Four times a day (QID) | ORAL | Status: DC | PRN

## 2023-02-27 MED ORDER — ENOXAPARIN SODIUM 40 MG/0.4ML IJ SOSY
40.0000 mg | PREFILLED_SYRINGE | INTRAMUSCULAR | Status: DC
  Administered 2023-02-27 – 2023-02-28 (×2): 40 mg via SUBCUTANEOUS
  Filled 2023-02-27 (×2): qty 0.4

## 2023-02-27 MED ORDER — ASPIRIN 81 MG PO TBEC
81.0000 mg | DELAYED_RELEASE_TABLET | Freq: Every day | ORAL | Status: DC
  Administered 2023-02-28 – 2023-03-01 (×2): 81 mg via ORAL
  Filled 2023-02-27 (×3): qty 1

## 2023-02-27 MED ORDER — DEXAMETHASONE SODIUM PHOSPHATE 4 MG/ML IJ SOLN
4.0000 mg | Freq: Two times a day (BID) | INTRAMUSCULAR | Status: DC
  Administered 2023-02-27 – 2023-03-01 (×4): 4 mg via INTRAVENOUS
  Filled 2023-02-27 (×4): qty 1

## 2023-02-27 MED ORDER — SODIUM CHLORIDE 0.9 % IV SOLN: INTRAVENOUS | Status: DC

## 2023-02-27 MED ORDER — HYDROCORTISONE 1 % EX CREA: 1.0000 | TOPICAL_CREAM | Freq: Three times a day (TID) | CUTANEOUS | Status: DC | PRN

## 2023-02-27 MED ORDER — TRAZODONE HCL 50 MG PO TABS
50.0000 mg | ORAL_TABLET | Freq: Every evening | ORAL | Status: DC | PRN
  Administered 2023-02-28: 50 mg via ORAL
  Filled 2023-02-27: qty 1

## 2023-02-27 MED ORDER — ONDANSETRON HCL 4 MG/2ML IJ SOLN: 4.0000 mg | Freq: Four times a day (QID) | INTRAMUSCULAR | Status: DC | PRN

## 2023-02-27 MED ORDER — HYDROCORTISONE (PERIANAL) 2.5 % EX CREA: 1.0000 | TOPICAL_CREAM | Freq: Four times a day (QID) | CUTANEOUS | Status: DC | PRN

## 2023-02-27 MED ORDER — FUROSEMIDE 10 MG/ML IJ SOLN
40.0000 mg | Freq: Once | INTRAMUSCULAR | Status: AC
  Administered 2023-02-27: 40 mg via INTRAVENOUS
  Filled 2023-02-27: qty 4

## 2023-02-27 MED ORDER — ALUM & MAG HYDROXIDE-SIMETH 200-200-20 MG/5ML PO SUSP: 30.0000 mL | ORAL | Status: DC | PRN

## 2023-02-27 MED ORDER — MELOXICAM 7.5 MG PO TABS
15.0000 mg | ORAL_TABLET | Freq: Every day | ORAL | Status: DC
  Administered 2023-03-01: 15 mg via ORAL
  Filled 2023-02-27 (×4): qty 2

## 2023-02-27 MED ORDER — TAMSULOSIN HCL 0.4 MG PO CAPS
0.4000 mg | ORAL_CAPSULE | Freq: Every day | ORAL | Status: DC
  Administered 2023-02-28 – 2023-03-01 (×2): 0.4 mg via ORAL
  Filled 2023-02-27 (×2): qty 1

## 2023-02-27 MED ORDER — ARFORMOTEROL TARTRATE 15 MCG/2ML IN NEBU
15.0000 ug | INHALATION_SOLUTION | Freq: Two times a day (BID) | RESPIRATORY_TRACT | Status: DC
  Administered 2023-02-27 – 2023-03-01 (×4): 15 ug via RESPIRATORY_TRACT
  Filled 2023-02-27 (×4): qty 2

## 2023-02-27 MED ORDER — MUSCLE RUB 10-15 % EX CREA: 1.0000 | TOPICAL_CREAM | CUTANEOUS | Status: DC | PRN

## 2023-02-27 MED ORDER — HYDRALAZINE HCL 20 MG/ML IJ SOLN: 10.0000 mg | INTRAMUSCULAR | Status: DC | PRN

## 2023-02-27 MED ORDER — METOPROLOL TARTRATE 5 MG/5ML IV SOLN: 5.0000 mg | INTRAVENOUS | Status: DC | PRN

## 2023-02-27 MED ORDER — AMLODIPINE BESYLATE 10 MG PO TABS
10.0000 mg | ORAL_TABLET | Freq: Every day | ORAL | Status: DC
  Filled 2023-02-27: qty 1

## 2023-02-27 MED ORDER — LOSARTAN POTASSIUM 50 MG PO TABS
100.0000 mg | ORAL_TABLET | Freq: Every day | ORAL | Status: DC
  Administered 2023-02-28 – 2023-03-01 (×2): 100 mg via ORAL
  Filled 2023-02-27 (×2): qty 2

## 2023-02-27 MED ORDER — POLYVINYL ALCOHOL 1.4 % OP SOLN: 1.0000 [drp] | OPHTHALMIC | Status: DC | PRN

## 2023-02-27 MED ORDER — SALINE SPRAY 0.65 % NA SOLN: 1.0000 | NASAL | Status: DC | PRN

## 2023-02-27 MED ORDER — REVEFENACIN 175 MCG/3ML IN SOLN
175.0000 ug | Freq: Every day | RESPIRATORY_TRACT | Status: DC
  Administered 2023-02-27 – 2023-03-01 (×3): 175 ug via RESPIRATORY_TRACT
  Filled 2023-02-27 (×3): qty 3

## 2023-02-27 MED ORDER — SODIUM CHLORIDE 0.9 % IV SOLN
1.0000 g | Freq: Once | INTRAVENOUS | Status: AC
  Administered 2023-02-27: 1 g via INTRAVENOUS
  Filled 2023-02-27: qty 10

## 2023-02-27 MED ORDER — ATORVASTATIN CALCIUM 40 MG PO TABS
40.0000 mg | ORAL_TABLET | Freq: Every day | ORAL | Status: DC
  Administered 2023-02-28 – 2023-03-01 (×2): 40 mg via ORAL
  Filled 2023-02-27 (×2): qty 1

## 2023-02-27 MED ORDER — LORATADINE 10 MG PO TABS: 10.0000 mg | ORAL_TABLET | Freq: Every day | ORAL | Status: DC | PRN

## 2023-02-27 MED ORDER — PHENOL 1.4 % MT LIQD: 1.0000 | OROMUCOSAL | Status: DC | PRN

## 2023-02-27 MED ORDER — IPRATROPIUM-ALBUTEROL 0.5-2.5 (3) MG/3ML IN SOLN: 3.0000 mL | RESPIRATORY_TRACT | Status: DC | PRN

## 2023-02-27 MED ORDER — SODIUM CHLORIDE 0.9 % IV BOLUS: 500.0000 mL | Freq: Once | INTRAVENOUS | Status: DC

## 2023-02-27 MED ORDER — SENNOSIDES-DOCUSATE SODIUM 8.6-50 MG PO TABS: 1.0000 | ORAL_TABLET | Freq: Every evening | ORAL | Status: DC | PRN

## 2023-02-27 MED ORDER — ACETAMINOPHEN 325 MG PO TABS
325.0000 mg | ORAL_TABLET | Freq: Once | ORAL | Status: AC
  Administered 2023-02-27: 325 mg via ORAL
  Filled 2023-02-27: qty 1

## 2023-02-27 MED ORDER — SODIUM CHLORIDE 0.9 % IV SOLN
500.0000 mg | Freq: Once | INTRAVENOUS | Status: AC
  Administered 2023-02-27: 500 mg via INTRAVENOUS
  Filled 2023-02-27: qty 5

## 2023-02-27 NOTE — H&P (Signed)
History and Physical    Robert [name] ION:[id number] DOB: Oct 06, 1940 DOA: 02/27/2023  PCP: Nelwyn Salisbury, MD Patient coming from: Home  Chief Complaint: Shortness of breath  HPI: Robert [name] is a 82 y.o. male with medical history significant of CAD status post CABG, HTN, HLD brought to the hospital for evaluation of weakness.  Patient states he went out to go eat about 3 days ago and after returning home he has felt very weak and has been having difficulty time getting out of bed due to fatigue.  This morning while standing up he slid down and his legs felt very weak.  He called his PCP who recommended that he should come to the hospital.  Upon admission patient was noted to be septic secondary to COVID-19 infection.  He was requiring 2 L of nasal cannula and being admitted to the hospital.   Review of Systems: As per HPI otherwise 10 point review of systems negative.  Review of Systems Otherwise negative except as per HPI, including: General: Denies unintended weight loss. Resp: Denies cough, wheezing, shortness of breath. Cardiac: Denies chest pain, palpitations, orthopnea, paroxysmal nocturnal dyspnea. GI: Denies abdominal pain, nausea, vomiting, diarrhea or constipation GU: Denies dysuria, frequency, hesitancy or incontinence MS: Denies muscle aches, joint pain or swelling Neuro: Denies headache, neurologic deficits (focal weakness, numbness, tingling), abnormal gait Psych: Denies anxiety, depression, SI/HI/AVH Skin: Denies new rashes or lesions ID: Denies sick contacts, exotic exposures, travel  Past Medical History:  Diagnosis Date   CAD (coronary artery disease)    per pt cardiologist dr Eden Emms, last office visit 02-05-2010, currently followed by pcp , dr fry   Diverticulosis of colon    ED (erectile dysfunction) of non-organic origin    History of colon polyps    02/ 2009 benign   Hyperlipidemia    Hypertension    Nodular prostate with lower urinary tract  symptoms    Prostate cancer [facility]) urologist-  dr wrenn/  oncologist-  dr Kathrynn Running   dx 06-15-2016,  Stage T2a, Gleason 3+3,  PSA 4.64,  vol 23.4cc   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 Samule Ohm   severe 2V CAD-- 90% LAD diagonal, 80% CFx ostium,  50% pRCA   COLONOSCOPY  08/03/2007   per Dr. Russella Dar, benign polyp, he declines to have any more    CORONARY ARTERY BYPASS GRAFT  05-02-2005  dr Zenaida Niece tright   LIMA to LAD,  radial graft to CFx marginal,  SVG to diagonal and RCA   EXICIOSN MASS INDEX FINGER Right 07/2015   per pt benign   PROSTATE BIOPSY  06/15/2016   RADIOACTIVE SEED IMPLANT [name] 10/20/2016   Procedure: RADIOACTIVE SEED IMPLANT/BRACHYTHERAPY IMPLANT, 71 seeds implanted, no seeds found in bladder;  Surgeon: Bjorn Pippin, MD;  Location: [facility];  Service: Urology;  Laterality: [name];    SOCIAL HISTORY:  reports that he has never smoked. He has never used smokeless tobacco. He reports that he does not drink alcohol and does not use drugs.  No Known Allergies  FAMILY HISTORY: Family History  Problem Relation Age of Onset   Sudden death Other    Cancer Maternal Aunt        ovarian   Cancer Maternal Uncle  prostate   Cancer Maternal Grandmother        breast   Cancer Maternal Grandfather        prostate   Cancer Maternal Uncle        prostate   Cancer Maternal Uncle        prostate   Cancer Maternal Uncle        prostate     Prior to Admission medications   Medication Sig Start Date End Date Taking? Authorizing Provider  amLODipine (NORVASC) 10 MG tablet Take 1 tablet (10 mg total) by mouth daily. 01/09/23   Nelwyn Salisbury, MD  aspirin 81 MG tablet Take 81 mg by mouth daily.    [provider]  atorvastatin (LIPITOR) 40 MG tablet Take 1 tablet (40 mg total) by mouth  daily. 06/08/22   Nelwyn Salisbury, MD  losartan (COZAAR) 100 MG tablet Take 1 tablet (100 mg total) by mouth daily. 01/12/23   Nelwyn Salisbury, MD  meloxicam (MOBIC) 15 MG tablet Take 1 tablet (15 mg total) by mouth daily. 06/08/22   Nelwyn Salisbury, MD  potassium chloride (KLOR-CON) 10 MEQ tablet Take 1 tablet (10 mEq total) by mouth daily. 10/17/22   Nelwyn Salisbury, MD  tamsulosin (FLOMAX) 0.4 MG CAPS capsule Take 0.4 mg by mouth daily.    [provider]    Physical Exam: Vitals:   02/27/23 1630 02/27/23 1700 02/27/23 1713 02/27/23 1730  BP: (!) 142/64 (!) 151/90  (!) 120/54  Pulse: (!) 108 (!) 113  (!) 103  Resp: (!) 25 (!) 26  18  Temp:   99.2 F (37.3 C)   TempSrc:   Oral   SpO2: 93% 90%  92%  Weight:      Height:          Constitutional: NAD, calm, comfortable Eyes: PERRL, lids and conjunctivae normal ENMT: Mucous membranes are moist. Posterior pharynx clear of any exudate or lesions.Normal dentition.  Neck: normal, supple, no masses, no thyromegaly Respiratory: Bilateral rhonchi with expiratory wheezing Cardiovascular: Regular rate and rhythm, no murmurs / rubs / gallops.  2+ bilateral lower extremity edema. 2+ pedal pulses. No carotid bruits.  Abdomen: no tenderness, no masses palpated. No hepatosplenomegaly. Bowel sounds positive.  Musculoskeletal: no clubbing / cyanosis. No joint deformity upper and lower extremities. Good ROM, no contractures. Normal muscle tone.  Skin: no rashes, lesions, ulcers. No induration Neurologic: CN 2-12 grossly intact. Sensation intact, DTR normal. Strength 5/5 in all 4.  Psychiatric: Normal judgment and insight. Alert and oriented x 3. Normal mood.    Body mass index is 35.19 kg/m.      Labs on Admission: I have personally reviewed following labs and imaging studies  CBC: Recent Labs  Lab 02/27/23 1409  WBC 8.8  NEUTROABS 7.7  HGB 14.2  HCT 45.1  MCV 99.6  PLT 159   Basic Metabolic Panel: Recent Labs  Lab 02/27/23 1409   NA 134*  K 3.6  CL 100  CO2 25  GLUCOSE 120*  BUN 24*  CREATININE 1.08  CALCIUM 8.8*   GFR: Estimated Creatinine Clearance: 60 mL/min (by C-G formula based on SCr of 1.08 mg/dL). Liver Function Tests: Recent Labs  Lab 02/27/23 1409  AST 26  ALT 31  ALKPHOS 86  BILITOT 1.1  PROT 7.1  ALBUMIN 3.3*   No results for input(s): "LIPASE", "AMYLASE" in the last 168 hours. No results for input(s): "AMMONIA" in the last 168 hours. Coagulation Profile: Recent Labs  Lab 02/27/23 1409  INR 1.1   Cardiac Enzymes: No results for input(s): "CKTOTAL", "CKMB", "CKMBINDEX", "TROPONINI" in the last 168 hours. BNP (last 3 results) No results for input(s): "PROBNP" in the last 8760 hours. HbA1C: No results for input(s): "HGBA1C" in the last 72 hours. CBG: Recent Labs  Lab 02/27/23 1322  GLUCAP 127*   Lipid Profile: No results for input(s): "CHOL", "HDL", "LDLCALC", "TRIG", "CHOLHDL", "LDLDIRECT" in the last 72 hours. Thyroid Function Tests: No results for input(s): "TSH", "T4TOTAL", "FREET4", "T3FREE", "THYROIDAB" in the last 72 hours. Anemia Panel: No results for input(s): "VITAMINB12", "FOLATE", "FERRITIN", "TIBC", "IRON", "RETICCTPCT" in the last 72 hours. Urine analysis:    Component Value Date/Time   BILIRUBINUR - 04/03/2019 1101   PROTEINUR Positive (A) 04/03/2019 1101   UROBILINOGEN 1.0 04/03/2019 1101   NITRITE - 04/03/2019 1101   LEUKOCYTESUR Negative 04/03/2019 1101   Sepsis Labs: !!!!!!!!!!!!!!!!!!!!!!!!!!!!!!!!!!!!!!!!!!!! @LABRCNTIP (procalcitonin:4,lacticidven:4) ) Recent Results (from the past 240 hour(s))  Resp panel by RT-PCR (RSV, Flu A&B, Covid) Peripheral     Status: Abnormal   Collection Time: 02/27/23  2:09 PM   Specimen: Peripheral; Nasal Swab  Result Value Ref Range Status   SARS Coronavirus 2 by RT PCR POSITIVE (A) NEGATIVE Final   Influenza A by PCR NEGATIVE NEGATIVE Final   Influenza B by PCR NEGATIVE NEGATIVE Final    Comment: (NOTE) The  Xpert Xpress SARS-CoV-2/FLU/RSV plus assay is intended as an aid in the diagnosis of influenza from Nasopharyngeal swab specimens and should not be used as a sole basis for treatment. Nasal washings and aspirates are unacceptable for Xpert Xpress SARS-CoV-2/FLU/RSV testing.  Fact Sheet for Patients: BloggerCourse.com  Fact Sheet for Healthcare Providers: SeriousBroker.it  This test is not yet approved or cleared by the [place] FDA and has been authorized for detection and/or diagnosis of SARS-CoV-2 by FDA under an Emergency Use Authorization (EUA). This EUA will remain in effect (meaning this test can be used) for the duration of the COVID-19 declaration under Section 564(b)(1) of the Act, 21 U.S.C. section 360bbb-3(b)(1), unless the authorization is terminated or revoked.     Resp Syncytial Virus by PCR NEGATIVE NEGATIVE Final    Comment: (NOTE) Fact Sheet for Patients: BloggerCourse.com  Fact Sheet for Healthcare Providers: SeriousBroker.it  This test is not yet approved or cleared by the [place] FDA and has been authorized for detection and/or diagnosis of SARS-CoV-2 by FDA under an Emergency Use Authorization (EUA). This EUA will remain in effect (meaning this test can be used) for the duration of the COVID-19 declaration under Section 564(b)(1) of the Act, 21 U.S.C. section 360bbb-3(b)(1), unless the authorization is terminated or revoked.  Performed at [facility], [address] [place], [place] [address]      Radiological Exams on Admission: DG Chest 2 View  Result Date: 02/27/2023 CLINICAL DATA:  Bilateral lower extremity swelling and tightness, fever EXAM: CHEST - 2 VIEW COMPARISON:  07/29/2016 FINDINGS: Relatively low lung volumes with some mild linear atelectasis or scarring in the lung bases. CABG markers. Heart size and mediastinal contours  are within normal limits. No effusion. Sternotomy wires. IMPRESSION: No acute cardiopulmonary disease. Electronically Signed   By: Corlis Leak M.D.   On: 02/27/2023 15:30    Nutritional status  All images have been reviewed by me personally.  EKG: Independently reviewed.  Nonischemic  Assessment/Plan Principal Problem:   Sepsis (HCC)    Sepsis secondary to COVID-19 infection Acute hypoxia on 2 L nasal cannula -  Admit patient to the hospital.  Sepsis protocol initiated.  Patient is currently hypoxic requiring 2-3 L nasal cannula.  Will start him on Decadron, bronchodilators, I-S/flutter valve.  Will check inflammatory markers, BNP, procalcitonin.  Incentive spirometer and flutter valve  Bilateral lower extremity edema - Patient given bolus in the ED secondary to sepsis protocol.  Currently have been expiratory wheezing, will stop fluids and give Lasix 40 mg IV once  History of CAD status post CABG -Continue home aspirin, statin, losartan  Essential hypertension - On losartan and Norvasc.  IV as needed  History of BPH - Flomax   DVT prophylaxis: Lovenox Code Status: Full code Family Communication: Daughter and wife at bedside Consults called: None Admission status: Inpatient admission to MedSurg  Status is: Inpatient Remains inpatient appropriate because: Admit for significant hypoxia   Time Spent: 65 minutes.  >50% of the time was devoted to discussing the patients care, assessment, plan and disposition with other care givers along with counseling the patient about the risks and benefits of treatment.    Miguel Rota MD [facility]  If 7PM-7AM, please contact night-coverage   02/27/2023, 6:24 PM

## 2023-02-27 NOTE — ED Provider Notes (Signed)
82 yo HTN T2DM HLD CABG  Leg swelling, bilateral without pain Started Friday after beach trip No SOB CP Arrive tachy, febrile, hypoxic (91%) - SEPSIS workup (lactic acid 1.6), ?COVID (pending), CXR clear Will need admission when labs are back RECHECK temp now Physical Exam  BP (!) 120/54   Pulse (!) 103   Temp 99.2 F (37.3 C) (Oral)   Resp 18   Ht 5\' 6"  (1.676 m)   Wt 98.9 kg   SpO2 92%   BMI 35.19 kg/m   Physical Exam Vitals and nursing note reviewed.  Constitutional:      Appearance: He is ill-appearing. He is not toxic-appearing.  HENT:     Mouth/Throat:     Mouth: Mucous membranes are dry.  Cardiovascular:     Rate and Rhythm: Regular rhythm. Tachycardia present.  Pulmonary:     Effort: Pulmonary effort is normal.     Comments: Oxygen level decreases to 89% while at rest when oxygen is removed.     Procedures  Procedures  ED Course / MDM   Clinical Course as of 02/27/23 1803  Mon Feb 27, 2023  1732 Patient awake alert. He is COVID positive, new oxygen requirement, nontoxic in appearance but hypoxic at rest when off O2 - 89%. No chest pain. CXR clear - no infiltrates/consolidations/edema. Legs minimally swollen, nontender. Will continue fluids. Admit for further care.  [SU]  1802 Discussed with Dr. Nelson Chimes, Hospitalist, who accepts for admission.  [SU]    Clinical Course User Index [SU] Elpidio Anis, PA-C   Medical Decision Making Amount and/or Complexity of Data Reviewed Labs: ordered. Radiology: ordered.  Risk OTC drugs.   Dx: Kathlee Nations, PA-C 02/27/23 1803    Benjiman Core, MD 02/27/23 2312

## 2023-02-27 NOTE — Progress Notes (Signed)
Work up pending in ED.  Requested ED provider to reach back out to [facility] once available so patient can be admitted at appropriate level of care.   Stephania Fragmin MD [facility]

## 2023-02-27 NOTE — ED Notes (Signed)
This RN attempted to give transfer of care report to IP RN, no answer.

## 2023-02-27 NOTE — ED Triage Notes (Signed)
Patient complains bilateral lower extremity swelling and tightness patient presents with sepsis vitals.  Tachy febrile.  Patient does not wear oxygen at home.  Patient having weakness to bilateral legs.

## 2023-02-27 NOTE — ED Provider Notes (Signed)
[place] EMERGENCY DEPARTMENT AT [facility] Provider Note   CSN: [id number] Arrival date & time: 02/27/23  1302     History  Chief Complaint  Patient presents with   Leg Swelling   HPI Robert [name] is a 82 y.o. male with hypertension, type 2 diabetes, hyperlipidemia presenting for leg swelling.  Started Friday.  Swelling is in both legs and primarily around the ankles and radiating up to the upper calves.  Denies calf tenderness.  Also mention that he felt generally weak.  Denies shortness of breath and chest pain.  Report a cough and nasal congestion but no fever at home.  States he went out of town this past week to [place].  Also mention that Friday night he felt like he had "food poisoning" with generalized abdominal pain and nausea but no vomiting diarrhea.  Those symptoms have subsided.  Also mention that he was treated for UTI 2 weeks ago.  Denies any urinary symptoms today.  HPI     Home Medications Prior to Admission medications   Medication Sig Start Date End Date Taking? Authorizing Provider  amLODipine (NORVASC) 10 MG tablet Take 1 tablet (10 mg total) by mouth daily. 01/09/23  Yes Nelwyn Salisbury, MD  aspirin 81 MG tablet Take 81 mg by mouth daily.   Yes [provider]  atorvastatin (LIPITOR) 40 MG tablet Take 1 tablet (40 mg total) by mouth daily. 06/08/22  Yes Nelwyn Salisbury, MD  latanoprost (XALATAN) 0.005 % ophthalmic solution Place 1 drop into both eyes at bedtime. 01/22/23  Yes [provider]  losartan (COZAAR) 100 MG tablet Take 1 tablet (100 mg total) by mouth daily. 01/12/23  Yes Nelwyn Salisbury, MD  meloxicam (MOBIC) 15 MG tablet Take 1 tablet (15 mg total) by mouth daily. 06/08/22  Yes Nelwyn Salisbury, MD  potassium chloride (KLOR-CON) 10 MEQ tablet Take 1 tablet (10 mEq total) by mouth daily. 10/17/22  Yes Nelwyn Salisbury, MD  tamsulosin (FLOMAX) 0.4 MG CAPS capsule Take 0.4 mg by mouth daily.   Yes [provider]       Allergies    Patient has no known allergies.    Review of Systems   Review of Systems   Physical Exam   Vitals:   02/28/23 0828 02/28/23 1508  BP: (!) 121/58 122/62  Pulse: (!) 108 94  Resp: 16 16  Temp: 98.3 F (36.8 C) 98.6 F (37 C)  SpO2: 99% 97%    CONSTITUTIONAL:  well-appearing, NAD NEURO:  Alert and oriented x 3, CN 3-12 grossly intact EYES:  eyes equal and reactive ENT/NECK:  Supple, no stridor  CARDIO:  Regular rate and rhythm, appears well-perfused  PULM:  No respiratory distress, CTAB GI/GU:  non-distended, soft MSK/SPINE:  No gross deformities, no edema, moves all extremities  SKIN:  no rash, atraumatic  *Additional and/or pertinent findings included in MDM below  ED Results / Procedures / Treatments   Labs (all labs ordered are listed, but only abnormal results are displayed) Labs Reviewed  RESP PANEL BY RT-PCR (RSV, FLU A&B, COVID)  [id number] - Abnormal; Notable for the following components:      Result Value   SARS Coronavirus 2 by RT PCR POSITIVE (*)    All other components within normal limits  COMPREHENSIVE METABOLIC PANEL - Abnormal; Notable for the following components:   Sodium 134 (*)    Glucose, Bld 120 (*)    BUN 24 (*)  Calcium 8.8 (*)    Albumin 3.3 (*)    All other components within normal limits  CBC WITH DIFFERENTIAL/PLATELET - Abnormal; Notable for the following components:   Lymphs Abs 0.5 (*)    All other components within normal limits  URINALYSIS, W/ REFLEX TO CULTURE (INFECTION SUSPECTED) - Abnormal; Notable for the following components:   APPearance HAZY (*)    Hgb urine dipstick SMALL (*)    Protein, ur 30 (*)    Bacteria, UA RARE (*)    All other components within normal limits  BRAIN NATRIURETIC PEPTIDE - Abnormal; Notable for the following components:   B Natriuretic Peptide 165.8 (*)    All other components within normal limits  BASIC METABOLIC PANEL - Abnormal; Notable for the following components:   Glucose, Bld  152 (*)    Calcium 8.4 (*)    All other components within normal limits  CBC - Abnormal; Notable for the following components:   MCV 100.7 (*)    Platelets 143 (*)    All other components within normal limits  APTT - Abnormal; Notable for the following components:   aPTT 23 (*)    All other components within normal limits  D-DIMER, QUANTITATIVE - Abnormal; Notable for the following components:   D-Dimer, Quant 7.34 (*)    All other components within normal limits  C-REACTIVE PROTEIN - Abnormal; Notable for the following components:   CRP 23.6 (*)    All other components within normal limits  BRAIN NATRIURETIC PEPTIDE - Abnormal; Notable for the following components:   B Natriuretic Peptide 197.4 (*)    All other components within normal limits  CBG MONITORING, ED - Abnormal; Notable for the following components:   Glucose-Capillary 127 (*)    All other components within normal limits  TROPONIN I (HIGH SENSITIVITY) - Abnormal; Notable for the following components:   Troponin I (High Sensitivity) 21 (*)    All other components within normal limits  TROPONIN I (HIGH SENSITIVITY) - Abnormal; Notable for the following components:   Troponin I (High Sensitivity) 18 (*)    All other components within normal limits  CULTURE, BLOOD (ROUTINE X 2)  CULTURE, BLOOD (ROUTINE X 2)  PROTIME-INR  APTT  PROTIME-INR  MAGNESIUM  PHOSPHORUS  PROCALCITONIN  I-STAT CG4 LACTIC ACID, ED  I-STAT CG4 LACTIC ACID, ED    EKG None  Radiology CT Angio Chest Pulmonary Embolism (PE) W or WO Contrast  Result Date: 02/28/2023 CLINICAL DATA:  Pulmonary embolism (PE) suspected, low to intermediate prob, positive D-dimer Pulmonary embolism (PE) suspected, high prob. EXAM: CT ANGIOGRAPHY CHEST WITH CONTRAST TECHNIQUE: Multidetector CT imaging of the chest was performed using the standard protocol during bolus administration of intravenous contrast. Multiplanar CT image reconstructions and MIPs were obtained  to evaluate the vascular anatomy. RADIATION DOSE REDUCTION: This exam was performed according to the departmental dose-optimization program which includes automated exposure control, adjustment of the mA and/or kV according to patient size and/or use of iterative reconstruction technique. CONTRAST:  75mL OMNIPAQUE IOHEXOL 350 MG/ML SOLN COMPARISON:  Chest radiograph 02/27/2023. FINDINGS: Cardiovascular: Satisfactory opacification of the pulmonary arteries to the segmental level. No evidence of pulmonary embolism. Postoperative changes of median sternotomy and CABG. Mediastinum/Nodes: No enlarged mediastinal, hilar, or axillary lymph nodes. Thyroid gland, trachea, and esophagus demonstrate no significant findings. Lungs/Pleura: Elevated right hemidiaphragm with adjacent atelectasis in the right lung base. Trace bilateral pleural effusions. No consolidation or pulmonary edema. Upper Abdomen: No acute abnormality. Musculoskeletal: No chest wall  abnormality. No acute or significant osseous findings. Review of the MIP images confirms the above findings. IMPRESSION: 1. No evidence of pulmonary embolism. 2. Elevated right hemidiaphragm with adjacent atelectasis in the right lung base. 3. Trace bilateral pleural effusions. Electronically Signed   By: Orvan Falconer M.D.   On: 02/28/2023 11:53   DG Chest 2 View  Result Date: 02/27/2023 CLINICAL DATA:  Bilateral lower extremity swelling and tightness, fever EXAM: CHEST - 2 VIEW COMPARISON:  07/29/2016 FINDINGS: Relatively low lung volumes with some mild linear atelectasis or scarring in the lung bases. CABG markers. Heart size and mediastinal contours are within normal limits. No effusion. Sternotomy wires. IMPRESSION: No acute cardiopulmonary disease. Electronically Signed   By: Corlis Leak M.D.   On: 02/27/2023 15:30    Procedures .Critical Care  Performed by: Gareth Eagle, PA-C Authorized by: Gareth Eagle, PA-C   Critical care provider statement:     Critical care time (minutes):  30   Critical care was necessary to treat or prevent imminent or life-threatening deterioration of the following conditions:  Respiratory failure (hypoxia with new oxygen requirement)   Critical care was time spent personally by me on the following activities:  Development of treatment plan with patient or surrogate, discussions with consultants, evaluation of patient's response to treatment, examination of patient, ordering and review of laboratory studies, ordering and review of radiographic studies, ordering and performing treatments and interventions, pulse oximetry, re-evaluation of patient's condition and review of old charts     Medications Ordered in ED Medications  sodium chloride 0.9 % bolus 500 mL (has no administration in time range)  aspirin EC tablet 81 mg (81 mg Oral Given 02/28/23 1018)  atorvastatin (LIPITOR) tablet 40 mg (40 mg Oral Given 02/28/23 1018)  meloxicam (MOBIC) tablet 15 mg (15 mg Oral Patient Refused/Not Given 02/28/23 1036)  tamsulosin (FLOMAX) capsule 0.4 mg (0.4 mg Oral Given 02/28/23 1018)  losartan (COZAAR) tablet 100 mg (100 mg Oral Given 02/28/23 1017)  enoxaparin (LOVENOX) injection 40 mg (40 mg Subcutaneous Given 02/27/23 2025)  arformoterol (BROVANA) nebulizer solution 15 mcg (15 mcg Nebulization Given 02/28/23 0801)  ondansetron (ZOFRAN) injection 4 mg (has no administration in time range)  revefenacin (YUPELRI) nebulizer solution 175 mcg (175 mcg Nebulization Given 02/28/23 0801)  loratadine (CLARITIN) tablet 10 mg (has no administration in time range)  polyvinyl alcohol (LIQUIFILM TEARS) 1.4 % ophthalmic solution 1 drop (has no administration in time range)  hydrocortisone (ANUSOL-HC) 2.5 % rectal cream 1 Application (has no administration in time range)  alum & mag hydroxide-simeth (MAALOX/MYLANTA) 200-200-20 MG/5ML suspension 30 mL (has no administration in time range)  hydrocortisone cream 1 % 1 Application (has no  administration in time range)  Muscle Rub CREA 1 Application (has no administration in time range)  sodium chloride (OCEAN) 0.65 % nasal spray 1 spray (has no administration in time range)  phenol (CHLORASEPTIC) mouth spray 1 spray (has no administration in time range)  hydrALAZINE (APRESOLINE) injection 10 mg (has no administration in time range)  senna-docusate (Senokot-S) tablet 1 tablet (has no administration in time range)  guaiFENesin (ROBITUSSIN) 100 MG/5ML liquid 5 mL (has no administration in time range)  traZODone (DESYREL) tablet 50 mg (has no administration in time range)  acetaminophen (TYLENOL) tablet 650 mg (has no administration in time range)  ipratropium-albuterol (DUONEB) 0.5-2.5 (3) MG/3ML nebulizer solution 3 mL (has no administration in time range)  metoprolol tartrate (LOPRESSOR) injection 5 mg (has no administration in time range)  dexamethasone (DECADRON) injection 4 mg (4 mg Intravenous Given 02/28/23 1019)  doxycycline (VIBRA-TABS) tablet 100 mg (100 mg Oral Given 02/28/23 1017)  amLODipine (NORVASC) tablet 10 mg (has no administration in time range)  acetaminophen (TYLENOL) tablet 325 mg (325 mg Oral Given 02/27/23 1455)  sodium chloride 0.9 % bolus 1,000 mL (0 mLs Intravenous Stopped 02/27/23 1637)  cefTRIAXone (ROCEPHIN) 1 g in sodium chloride 0.9 % 100 mL IVPB (0 g Intravenous Stopped 02/27/23 1637)  azithromycin (ZITHROMAX) 500 mg in sodium chloride 0.9 % 250 mL IVPB (0 mg Intravenous Stopped 02/27/23 1637)  furosemide (LASIX) injection 40 mg (40 mg Intravenous Given 02/27/23 2019)  iohexol (OMNIPAQUE) 350 MG/ML injection 75 mL (75 mLs Intravenous Contrast Given 02/28/23 0919)    ED Course/ Medical Decision Making/ A&P Clinical Course as of 02/28/23 1554  Mon Feb 27, 2023  1732 Patient awake alert. He is COVID positive, new oxygen requirement, nontoxic in appearance but hypoxic at rest when off O2 - 89%. No chest pain. CXR clear - no infiltrates/consolidations/edema.  Legs minimally swollen, nontender. Will continue fluids. Admit for further care.  [SU]  1802 Discussed with Dr. Nelson Chimes, Hospitalist, who accepts for admission.  [SU]    Clinical Course User Index [SU] Elpidio Anis, PA-C                                 Medical Decision Making Amount and/or Complexity of Data Reviewed Labs: ordered. Radiology: ordered.  Risk OTC drugs. Decision regarding hospitalization.   Initial Impression and Ddx 82 year old male initially febrile, tachycardic and hypoxic presenting for leg swelling.  Exam notable for tachypnea, hypoxia on room air, and lower extremity bilateral edema.  DDx includes PE, ACS, CHF exacerbation, pneumonia, sepsis COPD exacerbation, COVID, myocarditis, other. Patient PMH that increases complexity of ED encounter:   hypertension, type 2 diabetes, hyperlipidemia  Interpretation of Diagnostics - I independent reviewed and interpreted the labs as followed: No acute findings, COVID panel, troponins, and BNP are pending  - I independently visualized the following imaging with scope of interpretation limited to determining acute life threatening conditions related to emergency care: CXR, which revealed no acute cardiopulmonary process  -I personally reviewed and interpreted EKG which revealed sinus tachycardia  Patient Reassessment and Ultimate Disposition/Management Respiratory status improved after patient placed on 2 L nasal cannula.  Concern at this time is pneumonia with or without sepsis versus CHF exacerbation.  Fever responded well to Tylenol and patient does not have a white count at this time and lactate is less than 2 making sepsis unlikely.  Have some concern for viral pneumonia secondary to COVID or other viral process.  Respiratory PCR is pending.  Suspect he will need admission given new oxygen requirement and hypoxia.  Signed out patient to PA Elpidio Anis.   Patient management required discussion with the following services or  consulting groups:  None  Complexity of Problems Addressed Acute complicated illness or Injury  Additional Data Reviewed and Analyzed Further history obtained from: Past medical history and medications listed in the EMR, Prior ED visit notes, and Recent discharge summary  Patient Encounter Risk Assessment Consideration of hospitalization         Final Clinical Impression(s) / ED Diagnoses Final diagnoses:  COVID  Hypoxia    Rx / DC Orders ED Discharge Orders     None         Gareth Eagle, PA-C 02/28/23 1559  Edwin Dada P, DO 03/15/23 [phone]

## 2023-02-27 NOTE — ED Notes (Signed)
ED TO INPATIENT HANDOFF REPORT  ED Nurse Name and Phone #: Osvaldo Shipper RN [phone]  S Name/Age/Gender Robert [name] 82 y.o. male Room/Bed: 028C/028C  Code Status   Code Status: Not on file  Home/SNF/Other Home Patient oriented to: self, place, time, and situation Is this baseline? Yes   Triage Complete: Triage complete  Chief Complaint Bilateral Leg Swelling  Triage Note Patient complains bilateral lower extremity swelling and tightness patient presents with sepsis vitals.  Tachy febrile.  Patient does not wear oxygen at home.  Patient having weakness to bilateral legs.    Allergies No Known Allergies  Level of Care/Admitting Diagnosis ED Disposition     ED Disposition  Admit   Condition  --   Comment  The patient appears reasonably stabilized for admission considering the current resources, flow, and capabilities available in the ED at this time, and I doubt any other [facility] requiring further screening and/or treatment in the ED prior to admission is  present.          B Medical/Surgery History Past Medical History:  Diagnosis Date   CAD (coronary artery disease)    per pt cardiologist dr Eden Emms, last office visit 02-05-2010, currently followed by pcp , dr fry   Diverticulosis of colon    ED (erectile dysfunction) of non-organic origin    History of colon polyps    02/ 2009 benign   Hyperlipidemia    Hypertension    Nodular prostate with lower urinary tract symptoms    Prostate cancer [facility]) urologist-  dr wrenn/  oncologist-  dr Kathrynn Running   dx 06-15-2016,  Stage T2a, Gleason 3+3,  PSA 4.64,  vol 23.4cc   S/P CABG x 4 05/02/2005   LIMA to LAD,  radial graft to CFx marginal,  SVG to diagonal and RCA   Wears glasses    Past Surgical History:  Procedure Laterality Date   CARDIAC CATHETERIZATION  04-29-2005  dr w. Samule Ohm   severe 2V CAD-- 90% LAD diagonal, 80% CFx ostium,  50% pRCA   COLONOSCOPY  08/03/2007   per Dr. Russella Dar, benign polyp, he declines to  have any more    CORONARY ARTERY BYPASS GRAFT  05-02-2005  dr Zenaida Niece tright   LIMA to LAD,  radial graft to CFx marginal,  SVG to diagonal and RCA   EXICIOSN MASS INDEX FINGER Right 07/2015   per pt benign   PROSTATE BIOPSY  06/15/2016   RADIOACTIVE SEED IMPLANT [name] 10/20/2016   Procedure: RADIOACTIVE SEED IMPLANT/BRACHYTHERAPY IMPLANT, 71 seeds implanted, no seeds found in bladder;  Surgeon: Bjorn Pippin, MD;  Location: [facility];  Service: Urology;  Laterality: [name];     A IV Location/Drains/Wounds Patient Lines/Drains/Airways Status     Active Line/Drains/Airways     Name Placement date Placement time Site Days   Peripheral IV 02/27/23 20 G Right Antecubital 02/27/23  1413  Antecubital  less than 1   Peripheral IV 02/27/23 20 G Left Antecubital 02/27/23  1438  Antecubital  less than 1   Incision (Closed) 10/20/16 Perineum 10/20/16  0921  -- 2321            Intake/Output Last 24 hours  Intake/Output Summary (Last 24 hours) at 02/27/2023 1808 Last data filed at 02/27/2023 1637 Gross per 24 hour  Intake 1350 ml  Output --  Net 1350 ml    Labs/Imaging Results for orders placed or performed during the hospital encounter of 02/27/23 (from the past 48 hour(s))  CBG monitoring, ED  Status: Abnormal   Collection Time: 02/27/23  1:22 PM  Result Value Ref Range   Glucose-Capillary 127 (H) 70 - 99 mg/dL    Comment: Glucose reference range applies only to samples taken after fasting for at least 8 hours.  Comprehensive metabolic panel     Status: Abnormal   Collection Time: 02/27/23  2:09 PM  Result Value Ref Range   Sodium 134 (L) 135 - 145 mmol/L   Potassium 3.6 3.5 - 5.1 mmol/L   Chloride 100 98 - 111 mmol/L   CO2 25 22 - 32 mmol/L   Glucose, Bld 120 (H) 70 - 99 mg/dL    Comment: Glucose reference range applies only to samples taken after fasting for at least 8 hours.   BUN 24 (H) 8 - 23 mg/dL   Creatinine, Ser 9.56 0.61 - 1.24 mg/dL   Calcium 8.8 (L) 8.9 -  10.3 mg/dL   Total Protein 7.1 6.5 - 8.1 g/dL   Albumin 3.3 (L) 3.5 - 5.0 g/dL   AST 26 15 - 41 U/L   ALT 31 0 - 44 U/L   Alkaline Phosphatase 86 38 - 126 U/L   Total Bilirubin 1.1 0.3 - 1.2 mg/dL   GFR, Estimated >21 >30 mL/min    Comment: (NOTE) Calculated using the CKD-EPI Creatinine Equation (2021)    Anion gap 9 5 - 15    Comment: Performed at [facility], [address], [place], [place] [address]  CBC with Differential     Status: Abnormal   Collection Time: 02/27/23  2:09 PM  Result Value Ref Range   WBC 8.8 4.0 - 10.5 K/uL   RBC 4.53 4.22 - 5.81 MIL/uL   Hemoglobin 14.2 13.0 - 17.0 g/dL   HCT 46.9 62.9 - 52.8 %   MCV 99.6 80.0 - 100.0 fL   MCH 31.3 26.0 - 34.0 pg   MCHC 31.5 30.0 - 36.0 g/dL   RDW 41.3 24.4 - 01.0 %   Platelets 159 150 - 400 K/uL   nRBC 0.0 0.0 - 0.2 %   Neutrophils Relative % 87 %   Neutro Abs 7.7 1.7 - 7.7 K/uL   Lymphocytes Relative 6 %   Lymphs Abs 0.5 (L) 0.7 - 4.0 K/uL   Monocytes Relative 5 %   Monocytes Absolute 0.4 0.1 - 1.0 K/uL   Eosinophils Relative 1 %   Eosinophils Absolute 0.1 0.0 - 0.5 K/uL   Basophils Relative 0 %   Basophils Absolute 0.0 0.0 - 0.1 K/uL   Immature Granulocytes 1 %   Abs Immature Granulocytes 0.04 0.00 - 0.07 K/uL    Comment: Performed at [facility], [address], [place], [place] [address]  Protime-INR     Status: None   Collection Time: 02/27/23  2:09 PM  Result Value Ref Range   Prothrombin Time 14.4 11.4 - 15.2 seconds   INR 1.1 0.8 - 1.2    Comment: (NOTE) INR goal varies based on device and disease states. Performed at [facility], [address], [place], [place] [address]   Resp panel by RT-PCR (RSV, Flu A&B, Covid) Peripheral     Status: Abnormal   Collection Time: 02/27/23  2:09 PM   Specimen: Peripheral; Nasal Swab  Result Value Ref Range   SARS Coronavirus 2 by RT PCR POSITIVE (A) NEGATIVE   Influenza A by PCR NEGATIVE NEGATIVE   Influenza B by PCR NEGATIVE NEGATIVE     Comment: (NOTE) The  Xpert Xpress SARS-CoV-2/FLU/RSV plus assay is intended as an aid in the diagnosis of influenza from Nasopharyngeal swab specimens and should not be used as a sole basis for treatment. Nasal washings and aspirates are unacceptable for Xpert Xpress SARS-CoV-2/FLU/RSV testing.  Fact Sheet for Patients: BloggerCourse.com  Fact Sheet for Healthcare Providers: SeriousBroker.it  This test is not yet approved or cleared by the [place] FDA and has been authorized for detection and/or diagnosis of SARS-CoV-2 by FDA under an Emergency Use Authorization (EUA). This EUA will remain in effect (meaning this test can be used) for the duration of the COVID-19 declaration under Section 564(b)(1) of the Act, 21 U.S.C. section 360bbb-3(b)(1), unless the authorization is terminated or revoked.     Resp Syncytial Virus by PCR NEGATIVE NEGATIVE    Comment: (NOTE) Fact Sheet for Patients: BloggerCourse.com  Fact Sheet for Healthcare Providers: SeriousBroker.it  This test is not yet approved or cleared by the [place] FDA and has been authorized for detection and/or diagnosis of SARS-CoV-2 by FDA under an Emergency Use Authorization (EUA). This EUA will remain in effect (meaning this test can be used) for the duration of the COVID-19 declaration under Section 564(b)(1) of the Act, 21 U.S.C. section 360bbb-3(b)(1), unless the authorization is terminated or revoked.  Performed at [facility], [address] [place], [place] [address]   I-Stat Lactic Acid, ED     Status: None   Collection Time: 02/27/23  2:46 PM  Result Value Ref Range   Lactic Acid, Venous 1.6 0.5 - 1.9 mmol/L  Troponin I (High Sensitivity)     Status: Abnormal   Collection Time: 02/27/23  4:19 PM  Result Value Ref Range   Troponin I (High Sensitivity) 18 (H) <18 ng/L    Comment:  (NOTE) Elevated high sensitivity troponin I (hsTnI) values and significant  changes across serial measurements may suggest ACS but many other  chronic and acute conditions are known to elevate hsTnI results.  Refer to the "Links" section for chest pain algorithms and additional  guidance. Performed at [facility], [address], [place], [place] [address]   I-Stat Lactic Acid, ED     Status: None   Collection Time: 02/27/23  4:31 PM  Result Value Ref Range   Lactic Acid, Venous 0.6 0.5 - 1.9 mmol/L   DG Chest 2 View  Result Date: 02/27/2023 CLINICAL DATA:  Bilateral lower extremity swelling and tightness, fever EXAM: CHEST - 2 VIEW COMPARISON:  07/29/2016 FINDINGS: Relatively low lung volumes with some mild linear atelectasis or scarring in the lung bases. CABG markers. Heart size and mediastinal contours are within normal limits. No effusion. Sternotomy wires. IMPRESSION: No acute cardiopulmonary disease. Electronically Signed   By: Corlis Leak M.D.   On: 02/27/2023 15:30    Pending Labs Unresulted Labs (From admission, onward)     Start     Ordered   02/27/23 1415  APTT  (Undifferentiated presentation (screening labs and basic nursing orders))  ONCE - STAT,   STAT        02/27/23 1415   02/27/23 1415  Blood Culture (routine x 2)  (Undifferentiated presentation (screening labs and basic nursing orders))  BLOOD CULTURE X 2,   STAT      02/27/23 1415   02/27/23 1411  Brain natriuretic peptide  Once,   URGENT        02/27/23 1410   02/27/23 1318  Urinalysis, w/ Reflex to Culture (Infection Suspected) -Urine, Clean Catch  Once,   URGENT       Question:  Specimen Source  Answer:  Urine, Clean Catch   02/27/23 1318            Vitals/Pain Today's Vitals   02/27/23 1630 02/27/23 1700 02/27/23 1713 02/27/23 1730  BP: (!) 142/64 (!) 151/90  (!) 120/54  Pulse: (!) 108 (!) 113  (!) 103  Resp: (!) 25 (!) 26  18  Temp:   99.2 F (37.3 C)   TempSrc:   Oral   SpO2: 93% 90%  92%   Weight:      Height:      PainSc:        Isolation Precautions No active isolations  Medications Medications  sodium chloride 0.9 % bolus 500 mL (has no administration in time range)  acetaminophen (TYLENOL) tablet 325 mg (325 mg Oral Given 02/27/23 1455)  sodium chloride 0.9 % bolus 1,000 mL (0 mLs Intravenous Stopped 02/27/23 1637)  cefTRIAXone (ROCEPHIN) 1 g in sodium chloride 0.9 % 100 mL IVPB (0 g Intravenous Stopped 02/27/23 1637)  azithromycin (ZITHROMAX) 500 mg in sodium chloride 0.9 % 250 mL IVPB (0 mg Intravenous Stopped 02/27/23 1637)    Mobility walks with person assist     Focused Assessments Pulmonary Assessment Handoff:  Lung sounds: Bilateral Breath Sounds: Diminished L Breath Sounds: Diminished R Breath Sounds: Diminished O2 Device: Nasal Cannula O2 Flow Rate (L/min): 2 L/min    R Recommendations: See Admitting Provider Note  Report given to:   Additional Notes:

## 2023-02-27 NOTE — Telephone Encounter (Signed)
error 

## 2023-02-28 ENCOUNTER — Inpatient Hospital Stay (HOSPITAL_COMMUNITY): Payer: PPO

## 2023-02-28 DIAGNOSIS — A4189 Other specified sepsis: Secondary | ICD-10-CM | POA: Diagnosis not present

## 2023-02-28 DIAGNOSIS — U071 COVID-19: Secondary | ICD-10-CM | POA: Diagnosis not present

## 2023-02-28 LAB — CBC
HCT: 45.9 % (ref 39.0–52.0)
Hemoglobin: 14.9 g/dL (ref 13.0–17.0)
MCH: 32.7 pg (ref 26.0–34.0)
MCHC: 32.5 g/dL (ref 30.0–36.0)
MCV: 100.7 fL — ABNORMAL HIGH (ref 80.0–100.0)
Platelets: 143 10*3/uL — ABNORMAL LOW (ref 150–400)
RBC: 4.56 MIL/uL (ref 4.22–5.81)
RDW: 13.7 % (ref 11.5–15.5)
WBC: 6.9 10*3/uL (ref 4.0–10.5)
nRBC: 0 % (ref 0.0–0.2)

## 2023-02-28 LAB — PHOSPHORUS: Phosphorus: 4.5 mg/dL (ref 2.5–4.6)

## 2023-02-28 LAB — BASIC METABOLIC PANEL
Anion gap: 11 (ref 5–15)
BUN: 23 mg/dL (ref 8–23)
CO2: 26 mmol/L (ref 22–32)
Calcium: 8.4 mg/dL — ABNORMAL LOW (ref 8.9–10.3)
Chloride: 98 mmol/L (ref 98–111)
Creatinine, Ser: 0.95 mg/dL (ref 0.61–1.24)
GFR, Estimated: >60 mL/min (ref >60)
Glucose, Bld: 152 mg/dL — ABNORMAL HIGH (ref 70–99)
Potassium: 3.8 mmol/L (ref 3.5–5.1)
Sodium: 135 mmol/L (ref 135–145)

## 2023-02-28 LAB — APTT: aPTT: 23 seconds — ABNORMAL LOW (ref 24–36)

## 2023-02-28 LAB — BRAIN NATRIURETIC PEPTIDE: B Natriuretic Peptide: 197.4 pg/mL — ABNORMAL HIGH (ref 0.0–100.0)

## 2023-02-28 LAB — MAGNESIUM: Magnesium: 2 mg/dL (ref 1.7–2.4)

## 2023-02-28 LAB — D-DIMER, QUANTITATIVE: D-Dimer, Quant: 7.34 ug/mL-FEU — ABNORMAL HIGH (ref 0.00–0.50)

## 2023-02-28 LAB — PROTIME-INR
INR: 1 (ref 0.8–1.2)
Prothrombin Time: 13.5 seconds (ref 11.4–15.2)

## 2023-02-28 MED ORDER — LATANOPROST 0.005 % OP SOLN
1.0000 [drp] | Freq: Every day | OPHTHALMIC | Status: DC
  Administered 2023-02-28: 1 [drp] via OPHTHALMIC
  Filled 2023-02-28: qty 2.5

## 2023-02-28 MED ORDER — DOXYCYCLINE HYCLATE 100 MG PO TABS
100.0000 mg | ORAL_TABLET | Freq: Two times a day (BID) | ORAL | Status: DC
  Administered 2023-02-28 – 2023-03-01 (×3): 100 mg via ORAL
  Filled 2023-02-28 (×3): qty 1

## 2023-02-28 MED ORDER — IOHEXOL 350 MG/ML SOLN
75.0000 mL | Freq: Once | INTRAVENOUS | Status: AC | PRN
  Administered 2023-02-28: 75 mL via INTRAVENOUS

## 2023-02-28 MED ORDER — AMLODIPINE BESYLATE 10 MG PO TABS
10.0000 mg | ORAL_TABLET | Freq: Every day | ORAL | Status: DC
  Administered 2023-02-28: 10 mg via ORAL
  Filled 2023-02-28: qty 1

## 2023-02-28 NOTE — Plan of Care (Signed)

## 2023-02-28 NOTE — Progress Notes (Signed)
PROGRESS NOTE    Robert [name]  ZYS:[id number] DOB: 10-May-1941 DOA: 02/27/2023 PCP: Nelwyn Salisbury, MD   Brief Narrative:   82 y.o. male with medical history significant of CAD status post CABG, HTN, HLD brought to the hospital for evaluation of weakness.  He was admitted for sepsis secondary to COVID-19.   Assessment & Plan:  Principal Problem:   Sepsis (HCC)     Sepsis secondary to COVID-19 infection Acute hypoxia on 2 L nasal cannula - Sepsis physiology improving.  Still requiring 3 L nasal cannula.  D-dimer elevated, CTA negative for PE.  Continue Decadron, bronchodilators, I-S/flutter valve.  Slightly elevated procalcitonin, will start him on 5 days of doxycycline.   Bilateral lower extremity edema - Received Lasix IV once.  Closely monitor   History of CAD status post CABG -Continue home aspirin, statin, losartan   Essential hypertension - On losartan and Norvasc.  IV as needed   History of BPH - Flomax     DVT prophylaxis: Lovenox Code Status: Full code Family Communication:  Consults called: None Admission status: Continue hospital stay for evaluation and treatment of respiratory distress  Subjective: Still only improving, still some dyspnea on exertion.  Much better than yesterday   Examination:  General exam: Appears calm and comfortable, 2 L nasal cannula Respiratory system: Minimal bilateral rhonchi Cardiovascular system: S1 & S2 heard, RRR. No JVD, murmurs, rubs, gallops or clicks. No pedal edema. Gastrointestinal system: Abdomen is nondistended, soft and nontender. No organomegaly or masses felt. Normal bowel sounds heard. Central nervous system: Alert and oriented. No focal neurological deficits. Extremities: Symmetric 5 x 5 power. Skin: No rashes, lesions or ulcers Psychiatry: Judgement and insight appear normal. Mood & affect appropriate.      Diet Orders (From admission, onward)     Start     Ordered   02/27/23 1822  Diet Heart Room  service appropriate? Yes; Fluid consistency: Thin  Diet effective now       Question Answer Comment  Room service appropriate? Yes   Fluid consistency: Thin      02/27/23 1824            Objective: Vitals:   02/27/23 1845 02/27/23 2007 02/27/23 2135 02/28/23 0307  BP:  (!) 145/53  (!) 121/55  Pulse:  (!) 102  95  Resp:    18  Temp: 99.1 F (37.3 C) 98.9 F (37.2 C)  98.6 F (37 C)  TempSrc: Oral Oral  Oral  SpO2:  95% 95% 96%  Weight:      Height:        Intake/Output Summary (Last 24 hours) at 02/28/2023 0757 Last data filed at 02/28/2023 0300 Gross per 24 hour  Intake 1350 ml  Output 700 ml  Net 650 ml   Filed Weights   02/27/23 1316  Weight: 98.9 kg    Scheduled Meds:  amLODipine  10 mg Oral Daily   arformoterol  15 mcg Nebulization BID   aspirin EC  81 mg Oral Daily   atorvastatin  40 mg Oral Daily   dexamethasone (DECADRON) injection  4 mg Intravenous Q12H   doxycycline  100 mg Oral Q12H   enoxaparin (LOVENOX) injection  40 mg Subcutaneous Q24H   losartan  100 mg Oral Daily   meloxicam  15 mg Oral Daily   revefenacin  175 mcg Nebulization Daily   tamsulosin  0.4 mg Oral Daily   Continuous Infusions:  sodium chloride      Nutritional status  Body mass index is 35.19 kg/m.  Data Reviewed:   CBC: Recent Labs  Lab 02/27/23 1409 02/28/23 0633  WBC 8.8 6.9  NEUTROABS 7.7  --   HGB 14.2 14.9  HCT 45.1 45.9  MCV 99.6 100.7*  PLT 159 143*   Basic Metabolic Panel: Recent Labs  Lab 02/27/23 1409 02/28/23 0633  NA 134* 135  K 3.6 3.8  CL 100 98  CO2 25 26  GLUCOSE 120* 152*  BUN 24* 23  CREATININE 1.08 0.95  CALCIUM 8.8* 8.4*  MG  --  2.0  PHOS  --  4.5   GFR: Estimated Creatinine Clearance: 68.2 mL/min (by C-G formula based on SCr of 0.95 mg/dL). Liver Function Tests: Recent Labs  Lab 02/27/23 1409  AST 26  ALT 31  ALKPHOS 86  BILITOT 1.1  PROT 7.1  ALBUMIN 3.3*   No results for input(s): "LIPASE", "AMYLASE" in the  last 168 hours. No results for input(s): "AMMONIA" in the last 168 hours. Coagulation Profile: Recent Labs  Lab 02/27/23 1409 02/28/23 0633  INR 1.1 1.0   Cardiac Enzymes: No results for input(s): "CKTOTAL", "CKMB", "CKMBINDEX", "TROPONINI" in the last 168 hours. BNP (last 3 results) No results for input(s): "PROBNP" in the last 8760 hours. HbA1C: No results for input(s): "HGBA1C" in the last 72 hours. CBG: Recent Labs  Lab 02/27/23 1322  GLUCAP 127*   Lipid Profile: No results for input(s): "CHOL", "HDL", "LDLCALC", "TRIG", "CHOLHDL", "LDLDIRECT" in the last 72 hours. Thyroid Function Tests: No results for input(s): "TSH", "T4TOTAL", "FREET4", "T3FREE", "THYROIDAB" in the last 72 hours. Anemia Panel: No results for input(s): "VITAMINB12", "FOLATE", "FERRITIN", "TIBC", "IRON", "RETICCTPCT" in the last 72 hours. Sepsis Labs: Recent Labs  Lab 02/27/23 1446 02/27/23 1631 02/27/23 1956  PROCALCITON  --   --  1.40  LATICACIDVEN 1.6 0.6  --     Recent Results (from the past 240 hour(s))  Resp panel by RT-PCR (RSV, Flu A&B, Covid) Peripheral     Status: Abnormal   Collection Time: 02/27/23  2:09 PM   Specimen: Peripheral; Nasal Swab  Result Value Ref Range Status   SARS Coronavirus 2 by RT PCR POSITIVE (A) NEGATIVE Final   Influenza A by PCR NEGATIVE NEGATIVE Final   Influenza B by PCR NEGATIVE NEGATIVE Final    Comment: (NOTE) The Xpert Xpress SARS-CoV-2/FLU/RSV plus assay is intended as an aid in the diagnosis of influenza from Nasopharyngeal swab specimens and should not be used as a sole basis for treatment. Nasal washings and aspirates are unacceptable for Xpert Xpress SARS-CoV-2/FLU/RSV testing.  Fact Sheet for Patients: BloggerCourse.com  Fact Sheet for Healthcare Providers: SeriousBroker.it  This test is not yet approved or cleared by the [place] FDA and has been authorized for detection and/or  diagnosis of SARS-CoV-2 by FDA under an Emergency Use Authorization (EUA). This EUA will remain in effect (meaning this test can be used) for the duration of the COVID-19 declaration under Section 564(b)(1) of the Act, 21 U.S.C. section 360bbb-3(b)(1), unless the authorization is terminated or revoked.     Resp Syncytial Virus by PCR NEGATIVE NEGATIVE Final    Comment: (NOTE) Fact Sheet for Patients: BloggerCourse.com  Fact Sheet for Healthcare Providers: SeriousBroker.it  This test is not yet approved or cleared by the [place] FDA and has been authorized for detection and/or diagnosis of SARS-CoV-2 by FDA under an Emergency Use Authorization (EUA). This EUA will remain in effect (meaning this test can be used) for the duration of  the COVID-19 declaration under Section 564(b)(1) of the Act, 21 U.S.C. section 360bbb-3(b)(1), unless the authorization is terminated or revoked.  Performed at [facility], [address], [place], [place] [address]          Radiology Studies: DG Chest 2 View  Result Date: 02/27/2023 CLINICAL DATA:  Bilateral lower extremity swelling and tightness, fever EXAM: CHEST - 2 VIEW COMPARISON:  07/29/2016 FINDINGS: Relatively low lung volumes with some mild linear atelectasis or scarring in the lung bases. CABG markers. Heart size and mediastinal contours are within normal limits. No effusion. Sternotomy wires. IMPRESSION: No acute cardiopulmonary disease. Electronically Signed   By: Corlis Leak M.D.   On: 02/27/2023 15:30           LOS: 1 day   Time spent= 35 mins    Miguel Rota, MD [facility]  If 7PM-7AM, please contact night-coverage  02/28/2023, 7:57 AM

## 2023-03-01 ENCOUNTER — Other Ambulatory Visit (HOSPITAL_COMMUNITY): Payer: Self-pay

## 2023-03-01 DIAGNOSIS — A4189 Other specified sepsis: Secondary | ICD-10-CM | POA: Diagnosis not present

## 2023-03-01 DIAGNOSIS — U071 COVID-19: Secondary | ICD-10-CM | POA: Diagnosis not present

## 2023-03-01 LAB — BASIC METABOLIC PANEL
Anion gap: 10 (ref 5–15)
BUN: 36 mg/dL — ABNORMAL HIGH (ref 8–23)
CO2: 26 mmol/L (ref 22–32)
Calcium: 8.8 mg/dL — ABNORMAL LOW (ref 8.9–10.3)
Chloride: 100 mmol/L (ref 98–111)
Creatinine, Ser: 1 mg/dL (ref 0.61–1.24)
GFR, Estimated: >60 mL/min (ref >60)
Glucose, Bld: 187 mg/dL — ABNORMAL HIGH (ref 70–99)
Potassium: 4.6 mmol/L (ref 3.5–5.1)
Sodium: 136 mmol/L (ref 135–145)

## 2023-03-01 LAB — MAGNESIUM: Magnesium: 2.1 mg/dL (ref 1.7–2.4)

## 2023-03-01 MED ORDER — DOXYCYCLINE HYCLATE 100 MG PO TABS
100.0000 mg | ORAL_TABLET | Freq: Two times a day (BID) | ORAL | 0 refills | Status: DC
  Filled 2023-03-01: qty 7, 4d supply, fill #0

## 2023-03-01 MED ORDER — ALBUTEROL SULFATE HFA 108 (90 BASE) MCG/ACT IN AERS
2.0000 | INHALATION_SPRAY | Freq: Four times a day (QID) | RESPIRATORY_TRACT | 2 refills | Status: DC | PRN
  Filled 2023-03-01 – 2023-07-26 (×2): qty 6.7, 25d supply, fill #0

## 2023-03-01 MED ORDER — DEXAMETHASONE 4 MG PO TABS
4.0000 mg | ORAL_TABLET | Freq: Every day | ORAL | 0 refills | Status: DC
  Filled 2023-03-01: qty 4, 4d supply, fill #0

## 2023-03-01 NOTE — Progress Notes (Signed)
Transition of Care [facility]) - Inpatient Brief Assessment   Patient Details  Name: Robert [name] MRN: [id number] Date of Birth: [birth year]  Transition of [facility]) CM/SW Contact:    Janae Bridgeman, RN Phone Number: 03/01/2023, 12:52 PM   Clinical Narrative: CM met with the patient at the bedside to discuss TOC needs.  Home health services were offered to the patient for home safety evaluation and patient declined home health services.  Wife is at bedside.  Patient states that he is able to ambulate without difficulty and declines 3:1 as well.  Patient is independent and plans to return home by car when medically for discharge.   Transition of Care Asessment: Insurance and Status: (P) Insurance coverage has been reviewed Patient has primary care physician: (P) Yes Home environment has been reviewed: (P) Home with wife Prior level of function:: (P) Independent Prior/Current Home Services: (P) No current home services   Readmission risk has been reviewed: (P) Yes Transition of care needs: (P) no transition of care needs at this time

## 2023-03-01 NOTE — Hospital Course (Signed)
Brief Narrative:   82 y.o. male with medical history significant of CAD status post CABG, HTN, HLD brought to the hospital for evaluation of weakness.  He was admitted for sepsis secondary to COVID-19.  Patient was started on doxycycline empirically, CTA was negative for pulmonary embolism.  He was seen by physical therapy who recommended home health services.  On the day of discharge he was weaned off oxygen and ambulating in the hallway without any hypoxia.     Assessment & Plan:  Principal Problem:   Sepsis (HCC)      Sepsis secondary to COVID-19 infection Acute hypoxia on 2 L nasal cannula - Sepsis physiology improving.  Still requiring 3 L nasal cannula.  D-dimer elevated, CTA negative for PE.  Continue Decadron, bronchodilators, I-S/flutter valve.  Slightly elevated procalcitonin, will start him on 5 days of doxycycline.  At home he can complete course of doxycycline and Decadron   Bilateral lower extremity edema - Resolved   History of CAD status post CABG -Continue home aspirin, statin, losartan   Essential hypertension - On losartan and Norvasc.     History of BPH - Flomax     DVT prophylaxis: Lovenox Code Status: Full code Family Communication:  Consults called: None Discharge today

## 2023-03-01 NOTE — Evaluation (Signed)
Physical Therapy Evaluation Patient Details Name: Robert [name] MRN: [id number] DOB: 07-25-1940 Today's Date: 03/01/2023  History of Present Illness  Pt presenting 9/23 with BLE weakness, tightness, and swelling. Admitted for sepsis secondary to COVID-19. PMH significant for HTN, DMII, HLD, CAD s/p CABG.  Clinical Impression  Pt is presenting very close to baseline. Pt and spouse state that he previously would occasionally hold onto something when first standing due to neuropathy. Pt had 1 fall a month ago after getting up to walk when the carpet got bunched and pt tripped over it falling. Pt was able to get up off the floor without assistance. Currently pt is ind for sit to stand and gait without any LOB of concerns for significant weakness. Due to pt current functional status, home set up and available assistance at home no recommended skilled physical therapy services at this time on discharge from acute care hospital setting. Pt will be discharged from skilled physical therapy services at this time. Please re-consult if further needs arise.         If plan is discharge home, recommend the following: Help with stairs or ramp for entrance     Equipment Recommendations None recommended by PT     Functional Status Assessment Patient has not had a recent decline in their functional status     Precautions / Restrictions Precautions Precautions: Fall Restrictions Weight Bearing Restrictions: No      Mobility  Bed Mobility     General bed mobility comments: Pt up in recliner on entering and leaving room.    Transfers Overall transfer level: Independent Equipment used: None Transfers: Sit to/from Stand Sit to Stand: Independent           General transfer comment: No difficulty or LOB.    Ambulation/Gait Ambulation/Gait assistance: Independent Gait Distance (Feet): 250 Feet Assistive device: None Gait Pattern/deviations: WFL(Within Functional Limits), Wide base of  support, Decreased step length - right, Decreased step length - left   Gait velocity interpretation: >2.62 ft/sec, indicative of community ambulatory   General Gait Details: ER bil, initially slight stuttering to gait pattern but improved with fluidity with increased gait time.  Stairs Stairs:  (Did not perform stairs today; pt demonstrates adequate strength to perform stairs per home setup as demonstrated by sit to stand and gait with use of rail)               Balance Overall balance assessment: No apparent balance deficits (not formally assessed)   Sitting balance-Leahy Scale: Good     Standing balance support: No upper extremity supported Standing balance-Leahy Scale: Good Standing balance comment: No LOB with dynamic gait activities         Pertinent Vitals/Pain Pain Assessment Pain Assessment: No/denies pain    Home Living Family/patient expects to be discharged to:: Private residence Living Arrangements: Spouse/significant other Available Help at Discharge: Available 24 hours/day Type of Home: Other(Comment) (town home) Home Access: Level entry (threshold)     Alternate Level Stairs-Number of Steps: 14 stairs (only uses upstairs when he has company) Home Layout: Two level;Able to live on main level with bedroom/bathroom Home Equipment: None      Prior Function Prior Level of Function : Independent/Modified Independent;Driving               [facility]s [facility]. wife does finances, but pt manages his own medications     Extremity/Trunk Assessment   Upper Extremity Assessment Upper Extremity Assessment: Overall  WFL for tasks assessed    Lower Extremity Assessment Lower Extremity Assessment: Overall WFL for tasks assessed    Cervical / Trunk Assessment Cervical / Trunk Assessment: Normal  Communication   Communication Communication: No apparent difficulties (slightly hard of hearing)  Cognition Arousal: Alert Behavior  During Therapy: WFL for tasks assessed/performed Overall Cognitive Status: Within Functional Limits for tasks assessed        General Comments General comments (skin integrity, edema, etc.): Pt on room air throughout session  no shortness of breathe. Spouse present throughout and states that pt seems like he is at baseline. prior to this recent fall pt had 1 fall a month ago after tripping on the carpet and was able to get up off the ground without assistance.        Assessment/Plan    PT Assessment Patient does not need any further PT services         PT Goals (Current goals can be found in the Care Plan section)  Acute Rehab PT Goals PT Goal Formulation: All assessment and education complete, DC therapy        AM-PAC PT "6 Clicks" Mobility  Outcome Measure Help needed turning from your back to your side while in a flat bed without using bedrails?: None Help needed moving from lying on your back to sitting on the side of a flat bed without using bedrails?: None Help needed moving to and from a bed to a chair (including a wheelchair)?: None Help needed standing up from a chair using your arms (e.g., wheelchair or bedside chair)?: None Help needed to walk in hospital room?: None Help needed climbing 3-5 steps with a railing? : None 6 Click Score: 24    End of Session   Activity Tolerance: Patient tolerated treatment well Patient left: in chair;with call bell/phone within reach;with family/visitor present Nurse Communication: Mobility status      Time: 1005-1023 PT Time Calculation (min) (ACUTE ONLY): 18 min   Charges:   PT Evaluation $PT Eval Low Complexity: 1 Low   PT General Charges $$ ACUTE PT VISIT: 1 Visit        Harrel Carina, DPT, CLT  Acute Rehabilitation Services Office: [phone] (Secure chat preferred)   Claudia Desanctis 03/01/2023, 10:44 AM

## 2023-03-01 NOTE — Evaluation (Signed)
Occupational Therapy Evaluation Patient Details Name: Robert [name] MRN: [id number] DOB: Oct 08, 1940 Today's Date: 03/01/2023   History of Present Illness Pt presenting 9/23 with BLE weakness, tightness, and swelling. Admitted for sepsis secondary to COVID-19. PMH significant for HTN, DMII, HLD, CAD s/p CABG.   Clinical Impression   PTA, pt lived with his wife and was independent in ADL, IADL, driving, and [facility]. Upon eval, pt presents with decr cardiopulmonary endurance, balance, and memory. Pt requiring up to CGA during LB ADL and mobility in room. Pt SpO2 96% after mobility in room this session. Recommending HHOT for home safety eval and to optimize independence in ADL and IADL.       If plan is discharge home, recommend the following: A little help with walking and/or transfers;A little help with bathing/dressing/bathroom;Assistance with cooking/housework;Help with stairs or ramp for entrance;Assist for transportation;Direct supervision/assist for financial management;Direct supervision/assist for medications management    Functional Status Assessment  Patient has had a recent decline in their functional status and demonstrates the ability to make significant improvements in function in a reasonable and predictable amount of time.  Equipment Recommendations  BSC/3in1    Recommendations for Other Services       Precautions / Restrictions Precautions Precautions: Fall Restrictions Weight Bearing Restrictions: No      Mobility Bed Mobility Overal bed mobility: Needs Assistance Bed Mobility: Supine to Sit     Supine to sit: Supervision     General bed mobility comments: for safety only. Impulsively sititng up with blankets/cover still donned with MD in room to listen to lungs    Transfers Overall transfer level: Needs assistance Equipment used: None Transfers: Sit to/from Stand Sit to Stand: Contact guard assist            General transfer comment: for safety      Balance Overall balance assessment: Needs assistance   Sitting balance-Leahy Scale: Good     Standing balance support: No upper extremity supported, During functional activity Standing balance-Leahy Scale: Poor Standing balance comment: at least CGA dynamically.                           ADL either performed or assessed with clinical judgement   ADL Overall ADL's : Needs assistance/impaired Eating/Feeding: Modified independent   Grooming: Standing;Contact guard assist   Upper Body Bathing: Set up;Sitting   Lower Body Bathing: Contact guard assist;Sit to/from stand   Upper Body Dressing : Set up;Sitting   Lower Body Dressing: Contact guard assist;Sit to/from stand   Toilet Transfer: Contact guard assist;Ambulation   Toileting- Clothing Manipulation and Hygiene: Supervision/safety;Sitting/lateral lean       Functional mobility during ADLs: Contact guard assist       Vision Baseline Vision/History: 1 Wears glasses Ability to See in Adequate Light: 0 Adequate Patient Visual Report: No change from baseline Vision Assessment?: No apparent visual deficits     Perception Perception: Not tested       Praxis Praxis: Not tested       Pertinent Vitals/Pain       Extremity/Trunk Assessment Upper Extremity Assessment Upper Extremity Assessment: Generalized weakness   Lower Extremity Assessment Lower Extremity Assessment: Defer to PT evaluation       Communication Communication Communication: No apparent difficulties (slightly hard of hearing)   Cognition Arousal: Alert Behavior During Therapy: WFL for tasks assessed/performed Overall Cognitive Status: Impaired/Different from baseline Area of Impairment: Memory, Safety/judgement  Memory: Decreased short-term memory   Safety/Judgement: Decreased awareness of deficits, Decreased awareness of safety     General Comments: Pt  following all one to two step commands with increased time. Reporting memory difficulty and observed with decreased delayed memory recall during session. Pt wife entering during session and was supportive. Provided education regarding compensatory strategies for memory     General Comments  SpO2 >93 on RA during session    Exercises     Shoulder Instructions      Home Living Family/patient expects to be discharged to:: Private residence Living Arrangements: Spouse/significant other   Type of Home: Other(Comment) (town home) Home Access: Level entry (threshold)     Home Layout: Two level;Able to live on main level with bedroom/bathroom Alternate Level Stairs-Number of Steps: 14 stairs (only uses upstairs when he has company) Alternate Level Stairs-Rails: Left Bathroom Shower/Tub: Runner, broadcasting/film/video: None          Prior Functioning/Environment Prior Level of Function : Independent/Modified Independent;Driving               [facility]Ls Comments[facility]. wife does finances, but pt manages his own medications        OT Problem List: Decreased strength;Decreased activity tolerance;Impaired balance (sitting and/or standing);Decreased cognition;Decreased safety awareness;Decreased knowledge of use of DME or AE      OT Treatment/Interventions: Self-care/ADL training;Therapeutic exercise;DME and/or AE instruction;Balance training;Patient/family education;Cognitive remediation/compensation;Therapeutic activities;Energy conservation    OT Goals(Current goals can be found in the care plan section) Acute Rehab OT Goals Patient Stated Goal: go home OT Goal Formulation: With patient Time For Goal Achievement: 03/15/23 Potential to Achieve Goals: Good  OT Frequency: Min 1X/week    Co-evaluation              AM-PAC OT "6 Clicks" Daily Activity     Outcome Measure Help from another person eating meals?: None Help from  another person taking care of personal grooming?: A Little Help from another person toileting, which includes using toliet, bedpan, or urinal?: A Little Help from another person bathing (including washing, rinsing, drying)?: A Little Help from another person to put on and taking off regular upper body clothing?: A Little Help from another person to put on and taking off regular lower body clothing?: A Little 6 Click Score: 19   End of Session Equipment Utilized During Treatment: Gait belt Nurse Communication: Mobility status  Activity Tolerance: Patient tolerated treatment well Patient left: in chair;with call bell/phone within reach;with family/visitor present  OT Visit Diagnosis: Unsteadiness on feet (R26.81);Muscle weakness (generalized) (M62.81);Other symptoms and signs involving cognitive function                Time: 0960-4540 OT Time Calculation (min): 38 min Charges:  OT General Charges $OT Visit: 1 Visit OT Evaluation $OT Eval Low Complexity: 1 Low OT Treatments $Self Care/Home Management : 23-37 mins  Tyler Deis, OTR/L [facility] Acute Rehabilitation Office: [phone]   Myrla Halsted 03/01/2023, 9:58 AM

## 2023-03-01 NOTE — Plan of Care (Signed)

## 2023-03-01 NOTE — Discharge Summary (Signed)
Physician Discharge Summary  Robert [name] ZHY:[id number] DOB: 1940-08-26 DOA: 02/27/2023  PCP: Nelwyn Salisbury, MD  Admit date: 02/27/2023 Discharge date: 03/01/2023  Admitted From: Home Disposition: Home  Recommendations for Outpatient Follow-up:  Follow up with PCP in 1-2 weeks Please obtain BMP/CBC in one week your next doctors visit.  Complete prescribed course of Decadron and doxycycline Albuterol prescribed   Discharge Condition: Stable CODE STATUS: Full code Diet recommendation: Heart healthy  Brief/Interim Summary: Brief Narrative:   82 y.o. male with medical history significant of CAD status post CABG, HTN, HLD brought to the hospital for evaluation of weakness.  He was admitted for sepsis secondary to COVID-19.  Patient was started on doxycycline empirically, CTA was negative for pulmonary embolism.  He was seen by physical therapy who recommended home health services.  On the day of discharge he was weaned off oxygen and ambulating in the hallway without any hypoxia.     Assessment & Plan:  Principal Problem:   Sepsis (HCC)      Sepsis secondary to COVID-19 infection Acute hypoxia on 2 L nasal cannula - Sepsis physiology improving.  Still requiring 3 L nasal cannula.  D-dimer elevated, CTA negative for PE.  Continue Decadron, bronchodilators, I-S/flutter valve.  Slightly elevated procalcitonin, will start him on 5 days of doxycycline.  At home he can complete course of doxycycline and Decadron   Bilateral lower extremity edema - Resolved   History of CAD status post CABG -Continue home aspirin, statin, losartan   Essential hypertension - On losartan and Norvasc.     History of BPH - Flomax     DVT prophylaxis: Lovenox Code Status: Full code Family Communication:  Consults called: None Discharge today   Discharge Diagnoses:  Principal Problem:   Sepsis (HCC)      Consultations: None  Subjective: Doing well wishing to go home.  Off  oxygen  Discharge Exam: Vitals:   03/01/23 0641 03/01/23 0742  BP: 129/69   Pulse: 96   Resp: 17   Temp: 97.7 F (36.5 C)   SpO2: 98% 98%   Vitals:   02/28/23 1508 02/28/23 2245 03/01/23 0641 03/01/23 0742  BP: 122/62 119/66 129/69   Pulse: 94 91 96   Resp: 16 18 17    Temp: 98.6 F (37 C) 98.2 F (36.8 C) 97.7 F (36.5 C)   TempSrc: Oral Oral Oral   SpO2: 97% 96% 98% 98%  Weight:      Height:        General: Pt is alert, awake, not in acute distress Cardiovascular: RRR, S1/S2 +, no rubs, no gallops Respiratory: CTA bilaterally, no wheezing, no rhonchi Abdominal: Soft, NT, ND, bowel sounds + Extremities: no edema, no cyanosis  Discharge Instructions   Allergies as of 03/01/2023   No Known Allergies      Medication List     TAKE these medications    albuterol 108 (90 Base) MCG/ACT inhaler Commonly known as: VENTOLIN HFA Inhale 2 puffs into the lungs every 6 (six) hours as needed for wheezing or shortness of breath.   amLODipine 10 MG tablet Commonly known as: NORVASC Take 1 tablet (10 mg total) by mouth daily.   aspirin 81 MG tablet Take 81 mg by mouth daily.   atorvastatin 40 MG tablet Commonly known as: LIPITOR Take 1 tablet (40 mg total) by mouth daily.   dexamethasone 4 MG tablet Commonly known as: DECADRON Take 1 tablet (4 mg total) by mouth daily for 4 days.  doxycycline 100 MG tablet Commonly known as: VIBRA-TABS Take 1 tablet (100 mg total) by mouth every 12 (twelve) hours for 7 doses.   latanoprost 0.005 % ophthalmic solution Commonly known as: XALATAN Place 1 drop into both eyes at bedtime.   losartan 100 MG tablet Commonly known as: COZAAR Take 1 (one) tablet daily (Take 1 tablet (100 mg total) by mouth daily.)   meloxicam 15 MG tablet Commonly known as: MOBIC Take 1 (one) tablet daily (Take 1 tablet (15 mg total) by mouth daily.)   potassium chloride 10 MEQ tablet Commonly known as: KLOR-CON Take 1 tablet (10 mEq total) by  mouth daily.   tamsulosin 0.4 MG Caps capsule Commonly known as: FLOMAX Take 0.4 mg by mouth daily.        No Known Allergies  You were cared for by a hospitalist during your hospital stay. If you have any questions about your discharge medications or the care you received while you were in the hospital after you are discharged, you can call the unit and asked to speak with the hospitalist on call if the hospitalist that took care of you is not available. Once you are discharged, your primary care physician will handle any further medical issues. Please note that no refills for any discharge medications will be authorized once you are discharged, as it is imperative that you return to your primary care physician (or establish a relationship with a primary care physician if you do not have one) for your aftercare needs so that they can reassess your need for medications and monitor your lab values.  You were cared for by a hospitalist during your hospital stay. If you have any questions about your discharge medications or the care you received while you were in the hospital after you are discharged, you can call the unit and asked to speak with the hospitalist on call if the hospitalist that took care of you is not available. Once you are discharged, your primary care physician will handle any further medical issues. Please note that NO REFILLS for any discharge medications will be authorized once you are discharged, as it is imperative that you return to your primary care physician (or establish a relationship with a primary care physician if you do not have one) for your aftercare needs so that they can reassess your need for medications and monitor your lab values.  Please request your Prim.MD to go over all Hospital Tests and Procedure/Radiological results at the follow up, please get all Hospital records sent to your Prim MD by signing hospital release before you go home.  Get CBC, CMP, 2 view  Chest X ray checked  by Primary MD during your next visit or SNF MD in 5-7 days ( we routinely change or add medications that can affect your baseline labs and fluid status, therefore we recommend that you get the mentioned basic workup next visit with your PCP, your PCP may decide not to get them or add new tests based on their clinical decision)  On your next visit with your primary care physician please Get Medicines reviewed and adjusted.  If you experience worsening of your admission symptoms, develop shortness of breath, life threatening emergency, suicidal or homicidal thoughts you must seek medical attention immediately by calling 911 or calling your MD immediately  if symptoms less severe.  You Must read complete instructions/literature along with all the possible adverse reactions/side effects for all the Medicines you take and that have been prescribed to  you. Take any new Medicines after you have completely understood and accpet all the possible adverse reactions/side effects.   Do not drive, operate heavy machinery, perform activities at heights, swimming or participation in water activities or provide baby sitting services if your were admitted for syncope or siezures until you have seen by Primary MD or a Neurologist and advised to do so again.  Do not drive when taking Pain medications.   Procedures/Studies: CT Angio Chest Pulmonary Embolism (PE) W or WO Contrast  Result Date: 02/28/2023 CLINICAL DATA:  Pulmonary embolism (PE) suspected, low to intermediate prob, positive D-dimer Pulmonary embolism (PE) suspected, high prob. EXAM: CT ANGIOGRAPHY CHEST WITH CONTRAST TECHNIQUE: Multidetector CT imaging of the chest was performed using the standard protocol during bolus administration of intravenous contrast. Multiplanar CT image reconstructions and MIPs were obtained to evaluate the vascular anatomy. RADIATION DOSE REDUCTION: This exam was performed according to the departmental  dose-optimization program which includes automated exposure control, adjustment of the mA and/or kV according to patient size and/or use of iterative reconstruction technique. CONTRAST:  75mL OMNIPAQUE IOHEXOL 350 MG/ML SOLN COMPARISON:  Chest radiograph 02/27/2023. FINDINGS: Cardiovascular: Satisfactory opacification of the pulmonary arteries to the segmental level. No evidence of pulmonary embolism. Postoperative changes of median sternotomy and CABG. Mediastinum/Nodes: No enlarged mediastinal, hilar, or axillary lymph nodes. Thyroid gland, trachea, and esophagus demonstrate no significant findings. Lungs/Pleura: Elevated right hemidiaphragm with adjacent atelectasis in the right lung base. Trace bilateral pleural effusions. No consolidation or pulmonary edema. Upper Abdomen: No acute abnormality. Musculoskeletal: No chest wall abnormality. No acute or significant osseous findings. Review of the MIP images confirms the above findings. IMPRESSION: 1. No evidence of pulmonary embolism. 2. Elevated right hemidiaphragm with adjacent atelectasis in the right lung base. 3. Trace bilateral pleural effusions. Electronically Signed   By: Orvan Falconer M.D.   On: 02/28/2023 11:53   DG Chest 2 View  Result Date: 02/27/2023 CLINICAL DATA:  Bilateral lower extremity swelling and tightness, fever EXAM: CHEST - 2 VIEW COMPARISON:  07/29/2016 FINDINGS: Relatively low lung volumes with some mild linear atelectasis or scarring in the lung bases. CABG markers. Heart size and mediastinal contours are within normal limits. No effusion. Sternotomy wires. IMPRESSION: No acute cardiopulmonary disease. Electronically Signed   By: Corlis Leak M.D.   On: 02/27/2023 15:30     The results of significant diagnostics from this hospitalization (including imaging, microbiology, ancillary and laboratory) are listed below for reference.     Microbiology: Recent Results (from the past 240 hour(s))  Resp panel by RT-PCR (RSV, Flu A&B,  Covid) Peripheral     Status: Abnormal   Collection Time: 02/27/23  2:09 PM   Specimen: Peripheral; Nasal Swab  Result Value Ref Range Status   SARS Coronavirus 2 by RT PCR POSITIVE (A) NEGATIVE Final   Influenza A by PCR NEGATIVE NEGATIVE Final   Influenza B by PCR NEGATIVE NEGATIVE Final    Comment: (NOTE) The Xpert Xpress SARS-CoV-2/FLU/RSV plus assay is intended as an aid in the diagnosis of influenza from Nasopharyngeal swab specimens and should not be used as a sole basis for treatment. Nasal washings and aspirates are unacceptable for Xpert Xpress SARS-CoV-2/FLU/RSV testing.  Fact Sheet for Patients: BloggerCourse.com  Fact Sheet for Healthcare Providers: SeriousBroker.it  This test is not yet approved or cleared by the [place] FDA and has been authorized for detection and/or diagnosis of SARS-CoV-2 by FDA under an Emergency Use Authorization (EUA). This EUA will remain in effect (meaning  this test can be used) for the duration of the COVID-19 declaration under Section 564(b)(1) of the Act, 21 U.S.C. section 360bbb-3(b)(1), unless the authorization is terminated or revoked.     Resp Syncytial Virus by PCR NEGATIVE NEGATIVE Final    Comment: (NOTE) Fact Sheet for Patients: BloggerCourse.com  Fact Sheet for Healthcare Providers: SeriousBroker.it  This test is not yet approved or cleared by the [place] FDA and has been authorized for detection and/or diagnosis of SARS-CoV-2 by FDA under an Emergency Use Authorization (EUA). This EUA will remain in effect (meaning this test can be used) for the duration of the COVID-19 declaration under Section 564(b)(1) of the Act, 21 U.S.C. section 360bbb-3(b)(1), unless the authorization is terminated or revoked.  Performed at [facility], [address] [place], [place] [address]   Blood Culture (routine x 2)      Status: None (Preliminary result)   Collection Time: 02/27/23  2:15 PM   Specimen: BLOOD  Result Value Ref Range Status   Specimen Description BLOOD SITE NOT SPECIFIED  Final   Special Requests   Final    BOTTLES DRAWN AEROBIC AND ANAEROBIC Blood Culture adequate volume   Culture   Final    NO GROWTH 2 DAYS Performed at [facility], [address], [place], [place] [address]    Report Status PENDING  Incomplete  Blood Culture (routine x 2)     Status: None (Preliminary result)   Collection Time: 02/27/23  2:20 PM   Specimen: BLOOD  Result Value Ref Range Status   Specimen Description BLOOD SITE NOT SPECIFIED  Final   Special Requests   Final    BOTTLES DRAWN AEROBIC AND ANAEROBIC Blood Culture results may not be optimal due to an excessive volume of blood received in culture bottles   Culture   Final    NO GROWTH 2 DAYS Performed at [facility], [address], [place], [place] [address]    Report Status PENDING  Incomplete     Labs: BNP (last 3 results) Recent Labs    02/27/23 1430 02/28/23 0633  BNP 165.8* 197.4*   Basic Metabolic Panel: Recent Labs  Lab 02/27/23 1409 02/28/23 0633 03/01/23 0811  NA 134* 135 136  K 3.6 3.8 4.6  CL 100 98 100  CO2 25 26 26   GLUCOSE 120* 152* 187*  BUN 24* 23 36*  CREATININE 1.08 0.95 1.00  CALCIUM 8.8* 8.4* 8.8*  MG  --  2.0 2.1  PHOS  --  4.5  --    Liver Function Tests: Recent Labs  Lab 02/27/23 1409  AST 26  ALT 31  ALKPHOS 86  BILITOT 1.1  PROT 7.1  ALBUMIN 3.3*   No results for input(s): "LIPASE", "AMYLASE" in the last 168 hours. No results for input(s): "AMMONIA" in the last 168 hours. CBC: Recent Labs  Lab 02/27/23 1409 02/28/23 0633  WBC 8.8 6.9  NEUTROABS 7.7  --   HGB 14.2 14.9  HCT 45.1 45.9  MCV 99.6 100.7*  PLT 159 143*   Cardiac Enzymes: No results for input(s): "CKTOTAL", "CKMB", "CKMBINDEX", "TROPONINI" in the last 168 hours. BNP: Invalid input(s): "POCBNP" CBG: Recent  Labs  Lab 02/27/23 1322  GLUCAP 127*   D-Dimer Recent Labs    02/28/23 0633  DDIMER 7.34*   Hgb A1c No results for input(s): "HGBA1C" in the last 72 hours. Lipid Profile No results for input(s): "CHOL", "HDL", "LDLCALC", "TRIG", "CHOLHDL", "LDLDIRECT" in the last  72 hours. Thyroid function studies No results for input(s): "TSH", "T4TOTAL", "T3FREE", "THYROIDAB" in the last 72 hours.  Invalid input(s): "FREET3" Anemia work up No results for input(s): "VITAMINB12", "FOLATE", "FERRITIN", "TIBC", "IRON", "RETICCTPCT" in the last 72 hours. Urinalysis    Component Value Date/Time   COLORURINE YELLOW 02/27/2023 1409   APPEARANCEUR HAZY (A) 02/27/2023 1409   LABSPEC 1.018 02/27/2023 1409   PHURINE 5.0 02/27/2023 1409   GLUCOSEU NEGATIVE 02/27/2023 1409   HGBUR SMALL (A) 02/27/2023 1409   BILIRUBINUR NEGATIVE 02/27/2023 1409   BILIRUBINUR - 04/03/2019 1101   KETONESUR NEGATIVE 02/27/2023 1409   PROTEINUR 30 (A) 02/27/2023 1409   UROBILINOGEN 1.0 04/03/2019 1101   NITRITE NEGATIVE 02/27/2023 1409   LEUKOCYTESUR NEGATIVE 02/27/2023 1409   Sepsis Labs Recent Labs  Lab 02/27/23 1409 02/28/23 0633  WBC 8.8 6.9   Microbiology Recent Results (from the past 240 hour(s))  Resp panel by RT-PCR (RSV, Flu A&B, Covid) Peripheral     Status: Abnormal   Collection Time: 02/27/23  2:09 PM   Specimen: Peripheral; Nasal Swab  Result Value Ref Range Status   SARS Coronavirus 2 by RT PCR POSITIVE (A) NEGATIVE Final   Influenza A by PCR NEGATIVE NEGATIVE Final   Influenza B by PCR NEGATIVE NEGATIVE Final    Comment: (NOTE) The Xpert Xpress SARS-CoV-2/FLU/RSV plus assay is intended as an aid in the diagnosis of influenza from Nasopharyngeal swab specimens and should not be used as a sole basis for treatment. Nasal washings and aspirates are unacceptable for Xpert Xpress SARS-CoV-2/FLU/RSV testing.  Fact Sheet for Patients: BloggerCourse.com  Fact Sheet for  Healthcare Providers: SeriousBroker.it  This test is not yet approved or cleared by the [place] FDA and has been authorized for detection and/or diagnosis of SARS-CoV-2 by FDA under an Emergency Use Authorization (EUA). This EUA will remain in effect (meaning this test can be used) for the duration of the COVID-19 declaration under Section 564(b)(1) of the Act, 21 U.S.C. section 360bbb-3(b)(1), unless the authorization is terminated or revoked.     Resp Syncytial Virus by PCR NEGATIVE NEGATIVE Final    Comment: (NOTE) Fact Sheet for Patients: BloggerCourse.com  Fact Sheet for Healthcare Providers: SeriousBroker.it  This test is not yet approved or cleared by the [place] FDA and has been authorized for detection and/or diagnosis of SARS-CoV-2 by FDA under an Emergency Use Authorization (EUA). This EUA will remain in effect (meaning this test can be used) for the duration of the COVID-19 declaration under Section 564(b)(1) of the Act, 21 U.S.C. section 360bbb-3(b)(1), unless the authorization is terminated or revoked.  Performed at [facility], [address], [place], [place] [address]   Blood Culture (routine x 2)     Status: None (Preliminary result)   Collection Time: 02/27/23  2:15 PM   Specimen: BLOOD  Result Value Ref Range Status   Specimen Description BLOOD SITE NOT SPECIFIED  Final   Special Requests   Final    BOTTLES DRAWN AEROBIC AND ANAEROBIC Blood Culture adequate volume   Culture   Final    NO GROWTH 2 DAYS Performed at [facility], [address] [place], [place] [address]    Report Status PENDING  Incomplete  Blood Culture (routine x 2)     Status: None (Preliminary result)   Collection Time: 02/27/23  2:20 PM   Specimen: BLOOD  Result Value Ref Range Status   Specimen Description BLOOD SITE NOT SPECIFIED  Final  Special Requests   Final    BOTTLES  DRAWN AEROBIC AND ANAEROBIC Blood Culture results may not be optimal due to an excessive volume of blood received in culture bottles   Culture   Final    NO GROWTH 2 DAYS Performed at [facility], [address] [place], [place] [address]    Report Status PENDING  Incomplete     Time coordinating discharge:  I have spent 35 minutes face to face with the patient and on the ward discussing the patients care, assessment, plan and disposition with other care givers. >50% of the time was devoted counseling the patient about the risks and benefits of treatment/Discharge disposition and coordinating care.   SIGNED:   Miguel Rota, MD  [facility] 03/01/2023, 11:29 AM   If 7PM-7AM, please contact night-coverage

## 2023-03-02 ENCOUNTER — Emergency Department (HOSPITAL_COMMUNITY): Payer: PPO

## 2023-03-02 ENCOUNTER — Encounter (HOSPITAL_COMMUNITY)
Admission: EM | Disposition: A | Discharge status: Home Health | Payer: Self-pay | Source: Home / Self Care | Attending: Neurology

## 2023-03-02 ENCOUNTER — Inpatient Hospital Stay (HOSPITAL_COMMUNITY): Payer: PPO

## 2023-03-02 ENCOUNTER — Other Ambulatory Visit: Payer: Self-pay

## 2023-03-02 ENCOUNTER — Encounter (HOSPITAL_COMMUNITY): Payer: Self-pay | Admitting: Emergency Medicine

## 2023-03-02 ENCOUNTER — Inpatient Hospital Stay (HOSPITAL_COMMUNITY)
Admission: EM | Admit: 2023-03-02 | Discharge: 2023-03-06 | DRG: 023 | Disposition: A | Discharge status: Home Health | Payer: PPO | Attending: Neurology | Admitting: Neurology

## 2023-03-02 DIAGNOSIS — Z8673 Personal history of transient ischemic attack (TIA), and cerebral infarction without residual deficits: Secondary | ICD-10-CM

## 2023-03-02 DIAGNOSIS — I639 Cerebral infarction, unspecified: Secondary | ICD-10-CM | POA: Diagnosis present

## 2023-03-02 DIAGNOSIS — Z978 Presence of other specified devices: Secondary | ICD-10-CM | POA: Diagnosis not present

## 2023-03-02 DIAGNOSIS — R4701 Aphasia: Secondary | ICD-10-CM | POA: Diagnosis present

## 2023-03-02 DIAGNOSIS — A419 Sepsis, unspecified organism: Secondary | ICD-10-CM | POA: Diagnosis not present

## 2023-03-02 DIAGNOSIS — I6523 Occlusion and stenosis of bilateral carotid arteries: Secondary | ICD-10-CM | POA: Diagnosis not present

## 2023-03-02 DIAGNOSIS — G8191 Hemiplegia, unspecified affecting right dominant side: Secondary | ICD-10-CM | POA: Diagnosis not present

## 2023-03-02 DIAGNOSIS — Z8719 Personal history of other diseases of the digestive system: Secondary | ICD-10-CM

## 2023-03-02 DIAGNOSIS — R29810 Facial weakness: Secondary | ICD-10-CM | POA: Diagnosis present

## 2023-03-02 DIAGNOSIS — R1310 Dysphagia, unspecified: Secondary | ICD-10-CM | POA: Diagnosis not present

## 2023-03-02 DIAGNOSIS — S80212A Abrasion, left knee, initial encounter: Secondary | ICD-10-CM | POA: Diagnosis not present

## 2023-03-02 DIAGNOSIS — I63232 Cerebral infarction due to unspecified occlusion or stenosis of left carotid arteries: Principal | ICD-10-CM | POA: Diagnosis present

## 2023-03-02 DIAGNOSIS — I63512 Cerebral infarction due to unspecified occlusion or stenosis of left middle cerebral artery: Principal | ICD-10-CM

## 2023-03-02 DIAGNOSIS — Z7982 Long term (current) use of aspirin: Secondary | ICD-10-CM | POA: Diagnosis not present

## 2023-03-02 DIAGNOSIS — I63412 Cerebral infarction due to embolism of left middle cerebral artery: Secondary | ICD-10-CM | POA: Diagnosis present

## 2023-03-02 DIAGNOSIS — N403 Nodular prostate with lower urinary tract symptoms: Secondary | ICD-10-CM | POA: Diagnosis present

## 2023-03-02 DIAGNOSIS — Z79899 Other long term (current) drug therapy: Secondary | ICD-10-CM | POA: Diagnosis not present

## 2023-03-02 DIAGNOSIS — Z8679 Personal history of other diseases of the circulatory system: Secondary | ICD-10-CM

## 2023-03-02 DIAGNOSIS — Z791 Long term (current) use of non-steroidal anti-inflammatories (NSAID): Secondary | ICD-10-CM | POA: Diagnosis not present

## 2023-03-02 DIAGNOSIS — I2510 Atherosclerotic heart disease of native coronary artery without angina pectoris: Secondary | ICD-10-CM | POA: Diagnosis present

## 2023-03-02 DIAGNOSIS — U071 COVID-19: Secondary | ICD-10-CM | POA: Diagnosis not present

## 2023-03-02 DIAGNOSIS — Z8546 Personal history of malignant neoplasm of prostate: Secondary | ICD-10-CM

## 2023-03-02 DIAGNOSIS — D6869 Other thrombophilia: Secondary | ICD-10-CM | POA: Diagnosis not present

## 2023-03-02 DIAGNOSIS — E785 Hyperlipidemia, unspecified: Secondary | ICD-10-CM | POA: Diagnosis not present

## 2023-03-02 DIAGNOSIS — E669 Obesity, unspecified: Secondary | ICD-10-CM | POA: Diagnosis not present

## 2023-03-02 DIAGNOSIS — I10 Essential (primary) hypertension: Secondary | ICD-10-CM | POA: Diagnosis present

## 2023-03-02 DIAGNOSIS — J9811 Atelectasis: Secondary | ICD-10-CM | POA: Diagnosis not present

## 2023-03-02 DIAGNOSIS — G319 Degenerative disease of nervous system, unspecified: Secondary | ICD-10-CM | POA: Diagnosis not present

## 2023-03-02 DIAGNOSIS — Z803 Family history of malignant neoplasm of breast: Secondary | ICD-10-CM

## 2023-03-02 DIAGNOSIS — I6522 Occlusion and stenosis of left carotid artery: Secondary | ICD-10-CM | POA: Diagnosis not present

## 2023-03-02 DIAGNOSIS — Z8619 Personal history of other infectious and parasitic diseases: Secondary | ICD-10-CM

## 2023-03-02 DIAGNOSIS — Z6837 Body mass index (BMI) 37.0-37.9, adult: Secondary | ICD-10-CM

## 2023-03-02 DIAGNOSIS — Z8042 Family history of malignant neoplasm of prostate: Secondary | ICD-10-CM

## 2023-03-02 DIAGNOSIS — R471 Dysarthria and anarthria: Secondary | ICD-10-CM | POA: Diagnosis present

## 2023-03-02 DIAGNOSIS — Z9889 Other specified postprocedural states: Secondary | ICD-10-CM | POA: Diagnosis not present

## 2023-03-02 DIAGNOSIS — R9089 Other abnormal findings on diagnostic imaging of central nervous system: Secondary | ICD-10-CM | POA: Diagnosis not present

## 2023-03-02 DIAGNOSIS — J9690 Respiratory failure, unspecified, unspecified whether with hypoxia or hypercapnia: Secondary | ICD-10-CM | POA: Diagnosis not present

## 2023-03-02 DIAGNOSIS — R531 Weakness: Secondary | ICD-10-CM | POA: Diagnosis not present

## 2023-03-02 DIAGNOSIS — I6501 Occlusion and stenosis of right vertebral artery: Secondary | ICD-10-CM | POA: Diagnosis not present

## 2023-03-02 DIAGNOSIS — Z4682 Encounter for fitting and adjustment of non-vascular catheter: Secondary | ICD-10-CM | POA: Diagnosis not present

## 2023-03-02 DIAGNOSIS — I6782 Cerebral ischemia: Secondary | ICD-10-CM | POA: Diagnosis not present

## 2023-03-02 DIAGNOSIS — I63032 Cerebral infarction due to thrombosis of left carotid artery: Secondary | ICD-10-CM | POA: Diagnosis not present

## 2023-03-02 DIAGNOSIS — E119 Type 2 diabetes mellitus without complications: Secondary | ICD-10-CM | POA: Diagnosis present

## 2023-03-02 DIAGNOSIS — Z951 Presence of aortocoronary bypass graft: Secondary | ICD-10-CM

## 2023-03-02 DIAGNOSIS — R29704 NIHSS score 4: Secondary | ICD-10-CM | POA: Diagnosis not present

## 2023-03-02 DIAGNOSIS — J45909 Unspecified asthma, uncomplicated: Secondary | ICD-10-CM | POA: Diagnosis present

## 2023-03-02 DIAGNOSIS — Z8041 Family history of malignant neoplasm of ovary: Secondary | ICD-10-CM

## 2023-03-02 DIAGNOSIS — I672 Cerebral atherosclerosis: Secondary | ICD-10-CM | POA: Diagnosis not present

## 2023-03-02 DIAGNOSIS — I63522 Cerebral infarction due to unspecified occlusion or stenosis of left anterior cerebral artery: Secondary | ICD-10-CM | POA: Diagnosis not present

## 2023-03-02 DIAGNOSIS — M1711 Unilateral primary osteoarthritis, right knee: Secondary | ICD-10-CM | POA: Diagnosis not present

## 2023-03-02 DIAGNOSIS — I6389 Other cerebral infarction: Secondary | ICD-10-CM | POA: Diagnosis not present

## 2023-03-02 DIAGNOSIS — Z86010 Personal history of colonic polyps: Secondary | ICD-10-CM

## 2023-03-02 DIAGNOSIS — R29818 Other symptoms and signs involving the nervous system: Secondary | ICD-10-CM | POA: Diagnosis not present

## 2023-03-02 DIAGNOSIS — R930 Abnormal findings on diagnostic imaging of skull and head, not elsewhere classified: Secondary | ICD-10-CM | POA: Diagnosis not present

## 2023-03-02 HISTORY — PX: IR PERCUTANEOUS ART THROMBECTOMY/INFUSION INTRACRANIAL INC DIAG ANGIO: IMG6087

## 2023-03-02 HISTORY — PX: RADIOLOGY WITH ANESTHESIA: SHX6223

## 2023-03-02 HISTORY — PX: IR INTRAVSC STENT CERV CAROTID W/EMB-PROT MOD SED INCL ANGIO: IMG2303

## 2023-03-02 HISTORY — PX: IR CT HEAD LTD: IMG2386

## 2023-03-02 HISTORY — PX: IR US GUIDE VASC ACCESS RIGHT: IMG2390

## 2023-03-02 LAB — URINALYSIS, ROUTINE W REFLEX MICROSCOPIC
Bilirubin Urine: NEGATIVE
Glucose, UA: 50 mg/dL — AB
Hgb urine dipstick: NEGATIVE
Ketones, ur: NEGATIVE mg/dL
Leukocytes,Ua: NEGATIVE
Nitrite: NEGATIVE
Protein, ur: NEGATIVE mg/dL
Specific Gravity, Urine: 1.024 (ref 1.005–1.030)
pH: 6 (ref 5.0–8.0)

## 2023-03-02 LAB — DIFFERENTIAL
Abs Immature Granulocytes: 0.11 10*3/uL — ABNORMAL HIGH (ref 0.00–0.07)
Basophils Absolute: 0 10*3/uL (ref 0.0–0.1)
Basophils Relative: 0 %
Eosinophils Absolute: 0 10*3/uL (ref 0.0–0.5)
Eosinophils Relative: 0 %
Immature Granulocytes: 1 %
Lymphocytes Relative: 12 %
Lymphs Abs: 1.1 10*3/uL (ref 0.7–4.0)
Monocytes Absolute: 0.8 10*3/uL (ref 0.1–1.0)
Monocytes Relative: 8 %
Neutro Abs: 7.8 10*3/uL — ABNORMAL HIGH (ref 1.7–7.7)
Neutrophils Relative %: 79 %

## 2023-03-02 LAB — RAPID URINE DRUG SCREEN, HOSP PERFORMED
Amphetamines: NOT DETECTED
Barbiturates: NOT DETECTED
Benzodiazepines: NOT DETECTED
Cocaine: NOT DETECTED
Opiates: NOT DETECTED
Tetrahydrocannabinol: NOT DETECTED

## 2023-03-02 LAB — ETHANOL: Alcohol, Ethyl (B): <10 mg/dL (ref <10)

## 2023-03-02 LAB — POCT I-STAT 7, (LYTES, BLD GAS, ICA,H+H)
Acid-base deficit: 2 mmol/L (ref 0.0–2.0)
Bicarbonate: 23.5 mmol/L (ref 20.0–28.0)
Calcium, Ion: 1.09 mmol/L — ABNORMAL LOW (ref 1.15–1.40)
HCT: 36 % — ABNORMAL LOW (ref 39.0–52.0)
Hemoglobin: 12.2 g/dL — ABNORMAL LOW (ref 13.0–17.0)
O2 Saturation: 99 %
Potassium: 3.9 mmol/L (ref 3.5–5.1)
Sodium: 139 mmol/L (ref 135–145)
TCO2: 25 mmol/L (ref 22–32)
pCO2 arterial: 40.6 mmHg (ref 32–48)
pH, Arterial: 7.372 (ref 7.35–7.45)
pO2, Arterial: 139 mmHg — ABNORMAL HIGH (ref 83–108)

## 2023-03-02 LAB — I-STAT CHEM 8, ED
BUN: 57 mg/dL — ABNORMAL HIGH (ref 8–23)
Calcium, Ion: 1.07 mmol/L — ABNORMAL LOW (ref 1.15–1.40)
Chloride: 101 mmol/L (ref 98–111)
Creatinine, Ser: 0.9 mg/dL (ref 0.61–1.24)
Glucose, Bld: 117 mg/dL — ABNORMAL HIGH (ref 70–99)
HCT: 43 % (ref 39.0–52.0)
Hemoglobin: 14.6 g/dL (ref 13.0–17.0)
Potassium: 5.6 mmol/L — ABNORMAL HIGH (ref 3.5–5.1)
Sodium: 136 mmol/L (ref 135–145)
TCO2: 29 mmol/L (ref 22–32)

## 2023-03-02 LAB — CBC
HCT: 43 % (ref 39.0–52.0)
Hemoglobin: 13.9 g/dL (ref 13.0–17.0)
MCH: 31.2 pg (ref 26.0–34.0)
MCHC: 32.3 g/dL (ref 30.0–36.0)
MCV: 96.6 fL (ref 80.0–100.0)
Platelets: 249 10*3/uL (ref 150–400)
RBC: 4.45 MIL/uL (ref 4.22–5.81)
RDW: 13.7 % (ref 11.5–15.5)
WBC: 9.9 10*3/uL (ref 4.0–10.5)
nRBC: 0 % (ref 0.0–0.2)

## 2023-03-02 LAB — COMPREHENSIVE METABOLIC PANEL
ALT: 32 U/L (ref 0–44)
AST: 27 U/L (ref 15–41)
Albumin: 2.6 g/dL — ABNORMAL LOW (ref 3.5–5.0)
Alkaline Phosphatase: 63 U/L (ref 38–126)
Anion gap: 6 (ref 5–15)
BUN: 34 mg/dL — ABNORMAL HIGH (ref 8–23)
CO2: 27 mmol/L (ref 22–32)
Calcium: 8.3 mg/dL — ABNORMAL LOW (ref 8.9–10.3)
Chloride: 104 mmol/L (ref 98–111)
Creatinine, Ser: 0.95 mg/dL (ref 0.61–1.24)
GFR, Estimated: >60 mL/min (ref >60)
Glucose, Bld: 132 mg/dL — ABNORMAL HIGH (ref 70–99)
Potassium: 3.9 mmol/L (ref 3.5–5.1)
Sodium: 137 mmol/L (ref 135–145)
Total Bilirubin: 0.5 mg/dL (ref 0.3–1.2)
Total Protein: 5.3 g/dL — ABNORMAL LOW (ref 6.5–8.1)

## 2023-03-02 LAB — PROTIME-INR
INR: 1.1 (ref 0.8–1.2)
Prothrombin Time: 14.3 seconds (ref 11.4–15.2)

## 2023-03-02 LAB — CULTURE, BLOOD (ROUTINE X 2): Special Requests: ADEQUATE

## 2023-03-02 LAB — APTT: aPTT: 26 seconds (ref 24–36)

## 2023-03-02 LAB — MRSA NEXT GEN BY PCR, NASAL: MRSA by PCR Next Gen: NOT DETECTED

## 2023-03-02 SURGERY — RADIOLOGY WITH ANESTHESIA
Anesthesia: General

## 2023-03-02 MED ORDER — FENTANYL BOLUS VIA INFUSION: 25.0000 ug | INTRAVENOUS | Status: DC | PRN

## 2023-03-02 MED ORDER — IOHEXOL 300 MG/ML  SOLN
150.0000 mL | Freq: Once | INTRAMUSCULAR | Status: AC | PRN
  Administered 2023-03-02: 130 mL via INTRA_ARTERIAL

## 2023-03-02 MED ORDER — DEXAMETHASONE SODIUM PHOSPHATE 10 MG/ML IJ SOLN
INTRAMUSCULAR | Status: DC | PRN
  Administered 2023-03-02: 4 mg via INTRAVENOUS

## 2023-03-02 MED ORDER — CHLORHEXIDINE GLUCONATE CLOTH 2 % EX PADS
6.0000 | MEDICATED_PAD | Freq: Every day | CUTANEOUS | Status: DC
  Administered 2023-03-02 – 2023-03-04 (×3): 6 via TOPICAL

## 2023-03-02 MED ORDER — POLYETHYLENE GLYCOL 3350 17 G PO PACK: 17.0000 g | PACK | Freq: Every day | ORAL | Status: DC

## 2023-03-02 MED ORDER — SENNOSIDES-DOCUSATE SODIUM 8.6-50 MG PO TABS
1.0000 | ORAL_TABLET | Freq: Every evening | ORAL | Status: DC | PRN
  Administered 2023-03-05: 1 via ORAL
  Filled 2023-03-02: qty 1

## 2023-03-02 MED ORDER — SODIUM CHLORIDE 0.9 % IV BOLUS
1000.0000 mL | Freq: Once | INTRAVENOUS | Status: AC
  Administered 2023-03-02: 1000 mL via INTRAVENOUS

## 2023-03-02 MED ORDER — LIDOCAINE HCL URETHRAL/MUCOSAL 2 % EX GEL
1.0000 | Freq: Once | CUTANEOUS | Status: AC
  Administered 2023-03-02: 1 via URETHRAL

## 2023-03-02 MED ORDER — STROKE: EARLY STAGES OF RECOVERY BOOK: Freq: Once | Status: AC

## 2023-03-02 MED ORDER — PHENYLEPHRINE HCL-NACL 20-0.9 MG/250ML-% IV SOLN
INTRAVENOUS | Status: DC | PRN
  Administered 2023-03-02: 30 ug/min via INTRAVENOUS

## 2023-03-02 MED ORDER — CLEVIDIPINE BUTYRATE 0.5 MG/ML IV EMUL: 0.0000 mg/h | INTRAVENOUS | Status: DC

## 2023-03-02 MED ORDER — VASOPRESSIN 20 UNIT/ML IV SOLN
INTRAVENOUS | Status: DC | PRN
Start: 2023-03-02 — End: 2023-03-02
  Administered 2023-03-02 (×3): 1 [IU] via INTRAVENOUS
  Administered 2023-03-02: 2 [IU] via INTRAVENOUS

## 2023-03-02 MED ORDER — PHENYLEPHRINE HCL-NACL 20-0.9 MG/250ML-% IV SOLN
INTRAVENOUS | Status: AC
  Filled 2023-03-02: qty 250

## 2023-03-02 MED ORDER — CANGRELOR BOLUS VIA INFUSION
INTRAVENOUS | Status: AC | PRN
Start: 2023-03-02 — End: 2023-03-02
  Administered 2023-03-02: 2967 ug via INTRAVENOUS

## 2023-03-02 MED ORDER — FENTANYL 2500MCG IN NS 250ML (10MCG/ML) PREMIX INFUSION: 25.0000 ug/h | INTRAVENOUS | Status: DC

## 2023-03-02 MED ORDER — ORAL CARE MOUTH RINSE: 15.0000 mL | OROMUCOSAL | Status: DC | PRN

## 2023-03-02 MED ORDER — ROCURONIUM BROMIDE 10 MG/ML (PF) SYRINGE
PREFILLED_SYRINGE | INTRAVENOUS | Status: DC | PRN
  Administered 2023-03-02: 40 mg via INTRAVENOUS

## 2023-03-02 MED ORDER — ACETAMINOPHEN 325 MG PO TABS: 650.0000 mg | ORAL_TABLET | ORAL | Status: DC | PRN

## 2023-03-02 MED ORDER — ATORVASTATIN CALCIUM 40 MG PO TABS: 40.0000 mg | ORAL_TABLET | Freq: Every day | ORAL | Status: DC

## 2023-03-02 MED ORDER — TAMSULOSIN HCL 0.4 MG PO CAPS: 0.4000 mg | ORAL_CAPSULE | Freq: Every day | ORAL | Status: DC

## 2023-03-02 MED ORDER — GLYCOPYRROLATE 0.2 MG/ML IJ SOLN
INTRAMUSCULAR | Status: DC | PRN
  Administered 2023-03-02: .2 mg via INTRAVENOUS

## 2023-03-02 MED ORDER — IOHEXOL 300 MG/ML  SOLN
150.0000 mL | Freq: Once | INTRAMUSCULAR | Status: AC | PRN
  Administered 2023-03-02: 10 mL via INTRA_ARTERIAL

## 2023-03-02 MED ORDER — ACETAMINOPHEN 160 MG/5ML PO SOLN: 650.0000 mg | ORAL | Status: DC | PRN

## 2023-03-02 MED ORDER — SODIUM CHLORIDE 0.9 % IV SOLN: 250.0000 mL | INTRAVENOUS | Status: DC

## 2023-03-02 MED ORDER — ACETAMINOPHEN 650 MG RE SUPP: 650.0000 mg | RECTAL | Status: DC | PRN

## 2023-03-02 MED ORDER — SODIUM CHLORIDE 0.9 % IV SOLN
2.0000 ug/kg/min | INTRAVENOUS | Status: DC
  Administered 2023-03-02: 2 ug/kg/min via INTRAVENOUS
  Filled 2023-03-02: qty 50

## 2023-03-02 MED ORDER — DOCUSATE SODIUM 50 MG/5ML PO LIQD
100.0000 mg | Freq: Two times a day (BID) | ORAL | Status: DC
  Administered 2023-03-02 – 2023-03-03 (×2): 100 mg
  Filled 2023-03-02 (×2): qty 10

## 2023-03-02 MED ORDER — ORAL CARE MOUTH RINSE
15.0000 mL | OROMUCOSAL | Status: DC
  Administered 2023-03-02 – 2023-03-03 (×10): 15 mL via OROMUCOSAL

## 2023-03-02 MED ORDER — PROPOFOL 10 MG/ML IV BOLUS
INTRAVENOUS | Status: DC | PRN
  Administered 2023-03-02: 200 mg via INTRAVENOUS

## 2023-03-02 MED ORDER — IOHEXOL 350 MG/ML SOLN
100.0000 mL | Freq: Once | INTRAVENOUS | Status: AC | PRN
  Administered 2023-03-02: 100 mL via INTRAVENOUS

## 2023-03-02 MED ORDER — TICAGRELOR 90 MG PO TABS
180.0000 mg | ORAL_TABLET | Freq: Once | ORAL | Status: AC
  Administered 2023-03-02: 180 mg
  Filled 2023-03-02: qty 2

## 2023-03-02 MED ORDER — ASPIRIN 81 MG PO CHEW
81.0000 mg | CHEWABLE_TABLET | Freq: Once | ORAL | Status: AC
  Administered 2023-03-02: 81 mg
  Filled 2023-03-02: qty 1

## 2023-03-02 MED ORDER — FENTANYL CITRATE PF 50 MCG/ML IJ SOSY
25.0000 ug | PREFILLED_SYRINGE | Freq: Once | INTRAMUSCULAR | Status: DC
  Filled 2023-03-02: qty 1

## 2023-03-02 MED ORDER — LIDOCAINE 2% (20 MG/ML) 5 ML SYRINGE
INTRAMUSCULAR | Status: DC | PRN
  Administered 2023-03-02: 100 mg via INTRAVENOUS

## 2023-03-02 MED ORDER — IOHEXOL 300 MG/ML  SOLN
50.0000 mL | Freq: Once | INTRAMUSCULAR | Status: AC | PRN
  Administered 2023-03-02: 30 mL via INTRA_ARTERIAL

## 2023-03-02 MED ORDER — PROPOFOL 500 MG/50ML IV EMUL
INTRAVENOUS | Status: DC | PRN
Start: 2023-03-02 — End: 2023-03-02
  Administered 2023-03-02: 40 ug/kg/min via INTRAVENOUS

## 2023-03-02 MED ORDER — ACETAMINOPHEN 500 MG PO TABS
1000.0000 mg | ORAL_TABLET | Freq: Once | ORAL | Status: AC
  Administered 2023-03-02: 1000 mg via ORAL
  Filled 2023-03-02: qty 2

## 2023-03-02 MED ORDER — ONDANSETRON HCL 4 MG/2ML IJ SOLN
INTRAMUSCULAR | Status: DC | PRN
  Administered 2023-03-02: 4 mg via INTRAVENOUS

## 2023-03-02 MED ORDER — PHENYLEPHRINE HCL-NACL 20-0.9 MG/250ML-% IV SOLN
25.0000 ug/min | INTRAVENOUS | Status: DC
  Administered 2023-03-02: 50 ug/min via INTRAVENOUS
  Administered 2023-03-02: 100 ug/min via INTRAVENOUS
  Administered 2023-03-03: 30 ug/min via INTRAVENOUS
  Filled 2023-03-02 (×2): qty 250

## 2023-03-02 MED ORDER — CANGRELOR TETRASODIUM 50 MG IV SOLR
INTRAVENOUS | Status: AC
  Filled 2023-03-02: qty 50

## 2023-03-02 MED ORDER — EPHEDRINE SULFATE-NACL 50-0.9 MG/10ML-% IV SOSY
PREFILLED_SYRINGE | INTRAVENOUS | Status: DC | PRN
  Administered 2023-03-02: 5 mg via INTRAVENOUS

## 2023-03-02 MED ORDER — SODIUM CHLORIDE 0.9 % IV BOLUS: 250.0000 mL | INTRAVENOUS | Status: AC | PRN

## 2023-03-02 MED ORDER — PHENYLEPHRINE 80 MCG/ML (10ML) SYRINGE FOR IV PUSH (FOR BLOOD PRESSURE SUPPORT)
PREFILLED_SYRINGE | INTRAVENOUS | Status: DC | PRN
  Administered 2023-03-02: 80 ug via INTRAVENOUS
  Administered 2023-03-02: 240 ug via INTRAVENOUS

## 2023-03-02 MED ORDER — SUCCINYLCHOLINE CHLORIDE 200 MG/10ML IV SOSY
PREFILLED_SYRINGE | INTRAVENOUS | Status: DC | PRN
  Administered 2023-03-02: 140 mg via INTRAVENOUS

## 2023-03-02 MED ORDER — SODIUM CHLORIDE 0.9 % IV SOLN
INTRAVENOUS | Status: AC | PRN
  Administered 2023-03-02: 2 ug/kg/min via INTRAVENOUS

## 2023-03-02 MED ORDER — PROPOFOL 1000 MG/100ML IV EMUL
0.0000 ug/kg/min | INTRAVENOUS | Status: DC
  Administered 2023-03-02: 40 ug/kg/min via INTRAVENOUS
  Administered 2023-03-02: 20 ug/kg/min via INTRAVENOUS
  Administered 2023-03-03 (×2): 40 ug/kg/min via INTRAVENOUS
  Filled 2023-03-02 (×4): qty 100

## 2023-03-02 MED ORDER — PANTOPRAZOLE SODIUM 40 MG IV SOLR
40.0000 mg | Freq: Every day | INTRAVENOUS | Status: DC
  Administered 2023-03-02 – 2023-03-03 (×2): 40 mg via INTRAVENOUS
  Filled 2023-03-02 (×2): qty 10

## 2023-03-02 MED ORDER — SODIUM CHLORIDE 0.9 % IV SOLN: INTRAVENOUS | Status: DC

## 2023-03-02 NOTE — Sedation Documentation (Signed)
Sheath removed and perclose deployed at [employer]

## 2023-03-02 NOTE — ED Provider Notes (Addendum)
Robert [name] Provider Note   CSN: [id number] Arrival date & time: 03/02/23  1021     History  Chief Complaint  Patient presents with   Neurologic Problem    Robert [name] is a 82 y.o. male.  82 yo M with a chief complaint of difficulty with speech.  This started abruptly today.  He was last known normal when he went to bed last night about 9 PM.  Has some trouble with naming.  No other neurologic deficit was noted by EMS.  No headache no neck pain.  Patient is recently COVID-positive.  Was just hospitalized for 3 days.   Neurologic Problem       Home Medications Prior to Admission medications   Medication Sig Start Date End Date Taking? Authorizing Provider  albuterol (VENTOLIN HFA) 108 (90 Base) MCG/ACT inhaler Inhale 2 puffs into the lungs every 6 (six) hours as needed for wheezing or shortness of breath. 03/01/23   Amin, Ankit C, MD  amLODipine (NORVASC) 10 MG tablet Take 1 tablet (10 mg total) by mouth daily. 01/09/23   Nelwyn Salisbury, MD  aspirin 81 MG tablet Take 81 mg by mouth daily.    [provider]  atorvastatin (LIPITOR) 40 MG tablet Take 1 tablet (40 mg total) by mouth daily. 06/08/22   Nelwyn Salisbury, MD  dexamethasone (DECADRON) 4 MG tablet Take 1 tablet (4 mg total) by mouth daily for 4 days. 03/01/23 03/05/23  Amin, Ankit C, MD  doxycycline (VIBRA-TABS) 100 MG tablet Take 1 tablet (100 mg total) by mouth every 12 (twelve) hours for 7 doses. 03/01/23 03/05/23  Amin, Ankit C, MD  latanoprost (XALATAN) 0.005 % ophthalmic solution Place 1 drop into both eyes at bedtime. 01/22/23   [provider]  losartan (COZAAR) 100 MG tablet Take 1 tablet (100 mg total) by mouth daily. 01/12/23   Nelwyn Salisbury, MD  meloxicam (MOBIC) 15 MG tablet Take 1 tablet (15 mg total) by mouth daily. 06/08/22   Nelwyn Salisbury, MD  potassium chloride (KLOR-CON) 10 MEQ tablet Take 1 tablet (10 mEq total) by mouth daily. 10/17/22   Nelwyn Salisbury, MD  tamsulosin (FLOMAX) 0.4 MG CAPS capsule Take 0.4 mg by mouth daily.    [provider]      Allergies    Patient has no known allergies.    Review of Systems   Review of Systems  Physical Exam Updated Vital Signs BP (!) 167/82   Pulse (!) 106   Temp 97.7 F (36.5 C)   Resp 16   SpO2 91%  Physical Exam Vitals and nursing note reviewed.  Constitutional:      Appearance: He is well-developed.  HENT:     Head: Normocephalic and atraumatic.  Eyes:     Pupils: Pupils are equal, round, and reactive to light.  Neck:     Vascular: No JVD.  Cardiovascular:     Rate and Rhythm: Normal rate and regular rhythm.     Heart sounds: No murmur heard.    No friction rub. No gallop.  Pulmonary:     Effort: No respiratory distress.     Breath sounds: No wheezing.  Abdominal:     General: There is no distension.     Tenderness: There is no abdominal tenderness. There is no guarding or rebound.  Musculoskeletal:        General: Normal range of motion.     Cervical back:  Normal range of motion and neck supple.  Skin:    Coloration: Skin is not pale.     Findings: No rash.  Neurological:     Mental Status: He is alert and oriented to person, place, and time.     Cranial Nerves: Cranial nerves 2-12 are intact.     Sensory: Sensation is intact.     Motor: Motor function is intact.     Comments: The patient has some difficulty understanding commands.  Has significant issues understanding what I want him to do with finger-to-nose.  He does have some trouble with naming.  No obvious slurred speech.  Psychiatric:        Behavior: Behavior normal.     ED Results / Procedures / Treatments   Labs (all labs ordered are listed, but only abnormal results are displayed) Labs Reviewed  DIFFERENTIAL - Abnormal; Notable for the following components:      Result Value   Neutro Abs 7.8 (*)    Abs Immature Granulocytes 0.11 (*)    All other components within normal limits   COMPREHENSIVE METABOLIC PANEL - Abnormal; Notable for the following components:   Glucose, Bld 132 (*)    BUN 34 (*)    Calcium 8.3 (*)    Total Protein 5.3 (*)    Albumin 2.6 (*)    All other components within normal limits  I-STAT CHEM 8, ED - Abnormal; Notable for the following components:   Potassium 5.6 (*)    BUN 57 (*)    Glucose, Bld 117 (*)    Calcium, Ion 1.07 (*)    All other components within normal limits  CBC  PROTIME-INR  APTT  ETHANOL  RAPID URINE DRUG SCREEN, HOSP PERFORMED  URINALYSIS, ROUTINE W REFLEX MICROSCOPIC  CBC WITH DIFFERENTIAL/PLATELET    EKG EKG Interpretation Date/Time:  Thursday March 02 2023 10:30:32 EDT Ventricular Rate:  96 PR Interval:  193 QRS Duration:  96 QT Interval:  324 QTC Calculation: 410 R Axis:   46  Text Interpretation: Sinus rhythm Atrial premature complex RSR' in V1 or V2, probably normal variant No significant change since last tracing Confirmed by Melene Plan [phone]) on 03/02/2023 10:31:25 AM  Radiology CT HEAD CODE STROKE WO CONTRAST`  Result Date: 03/02/2023 CLINICAL DATA:  Code stroke. Aphasia, inability to identify objects or follow commands EXAM: CT ANGIOGRAPHY HEAD AND NECK CT PERFUSION BRAIN TECHNIQUE: Multidetector CT imaging of the head and neck was performed using the standard protocol during bolus administration of intravenous contrast. Multiplanar CT image reconstructions and MIPs were obtained to evaluate the vascular anatomy. Carotid stenosis measurements (when applicable) are obtained utilizing NASCET criteria, using the distal internal carotid diameter as the denominator. Multiphase CT imaging of the brain was performed following IV bolus contrast injection. Subsequent parametric perfusion maps were calculated using RAPID software. RADIATION DOSE REDUCTION: This exam was performed according to the departmental dose-optimization program which includes automated exposure control, adjustment of the mA and/or kV  according to patient size and/or use of iterative reconstruction technique. CONTRAST:  OMNIPAQUE IOHEXOL 350 MG/ML SOLN COMPARISON:  None Available. FINDINGS: CT HEAD FINDINGS Brain: There is no evidence of acute intracranial hemorrhage. There is suspected early infarct in the left basal ganglia in the MCA distribution (2-24). ASPECTS is 9. There is parenchymal volume loss with prominence of the ventricular system and extra-axial CSF spaces. There is multifocal encephalomalacia in the left frontal lobe in the MCA distribution likely reflecting prior infarcts. Additional hypodensity in the  supratentorial white matter likely reflects underlying chronic small-vessel ischemic change The pituitary and suprasellar region are normal. There is no mass lesion. There is no mass effect or midline shift. Vascular: See below. Skull: Normal. Negative for fracture or focal lesion. Sinuses/Orbits: There is mild mucosal thickening in the paranasal sinuses. The globes and orbits are unremarkable. Other: The mastoid air cells and middle ear cavities are clear. Review of the MIP images confirms the above findings CTA NECK FINDINGS Aortic arch: The imaged aortic arch is normal. The origins of the major branch vessels are patent. The subclavian arteries are patent to the level imaged. Right carotid system: The right common, internal, and external carotid arteries are patent, with calcified plaque at the bifurcation resulting in less than 50% stenosis. There is no evidence of dissection or aneurysm. Left carotid system: The left common carotid artery is patent. The internal carotid artery is occluded from the bifurcation throughout the remainder of its course in the neck. The external carotid artery is patent. Vertebral arteries: There is moderate stenosis of the origin of the right vertebral artery. There is scattered calcified plaque in the remainder of the vertebral arteries without other significant stenosis or occlusion there  is no evidence of dissection or aneurysm. Skeleton: There is no acute osseous abnormality or suspicious osseous lesion. There is no visible canal hematoma. Other neck: The soft tissues of the neck are unremarkable. Upper chest: The imaged lung apices are clear. There is debris in the esophagus. Review of the MIP images confirms the above findings CTA HEAD FINDINGS Anterior circulation: There is reconstitution of flow in the cavernous segment of the left ICA with linear nonocclusive hypodense filling defect likely reflecting clot. The proximal M1 segment is patent, but there is occlusion of the M1 segment just after the origin of the anterior temporal branch. There is reconstitution of flow in the M2 branches of the bifurcation. There is mild calcified plaque in the right intracranial ICA without significant stenosis. The right M1 segment and distal branches are patent, without proximal stenosis or occlusion. The bilateral ACAS are patent, without proximal stenosis or occlusion. The anterior communicating artery is normal. There is no aneurysm or AVM. Posterior circulation: The bilateral V4 segments are patent. The basilar artery is patent. The left PICA origin is not definitely seen. The other major cerebellar arteries appear patent. The bilateral PCAs are patent, without proximal stenosis or occlusion. Small bilateral posterior communicating arteries are identified. There is no aneurysm or AVM. Venous sinuses: Patent. Anatomic variants: None. Review of the MIP images confirms the above findings CT Brain Perfusion Findings: ASPECTS: 10 CBF (<30%) Volume: 13mL Perfusion (Tmax>6.0s) volume: 70mL Mismatch Volume: 57mL Infarction Location:Left MCA distribution. IMPRESSION: 1. Occluded left internal carotid artery from the bifurcation throughout the remainder of its course in the neck. There is reconstitution of flow in the cavernous segment with nonocclusive hypodense filling defect likely reflecting clot. 2. Occluded  left M1 segment just after the anterior temporal branch with reconstitution of flow in the M2 segments of the bifurcation. 3. Possible early infarct in the left basal ganglia.  ASPECTS is 9. 4. 13 cc infarct core and 57 cc mismatch in the MCA distribution. 5. Moderate right vertebral artery origin stenosis and mild plaque at the right carotid bifurcation. Findings discussed with Dr Selina Cooley at 2:29 pm. Electronically Signed   By: Lesia Hausen M.D.   On: 03/02/2023 14:40   CT ANGIO HEAD NECK W WO CM W PERF (CODE STROKE)  Result Date:  03/02/2023 CLINICAL DATA:  Code stroke. Aphasia, inability to identify objects or follow commands EXAM: CT ANGIOGRAPHY HEAD AND NECK CT PERFUSION BRAIN TECHNIQUE: Multidetector CT imaging of the head and neck was performed using the standard protocol during bolus administration of intravenous contrast. Multiplanar CT image reconstructions and MIPs were obtained to evaluate the vascular anatomy. Carotid stenosis measurements (when applicable) are obtained utilizing NASCET criteria, using the distal internal carotid diameter as the denominator. Multiphase CT imaging of the brain was performed following IV bolus contrast injection. Subsequent parametric perfusion maps were calculated using RAPID software. RADIATION DOSE REDUCTION: This exam was performed according to the departmental dose-optimization program which includes automated exposure control, adjustment of the mA and/or kV according to patient size and/or use of iterative reconstruction technique. CONTRAST:  OMNIPAQUE IOHEXOL 350 MG/ML SOLN COMPARISON:  None Available. FINDINGS: CT HEAD FINDINGS Brain: There is no evidence of acute intracranial hemorrhage. There is suspected early infarct in the left basal ganglia in the MCA distribution (2-24). ASPECTS is 9. There is parenchymal volume loss with prominence of the ventricular system and extra-axial CSF spaces. There is multifocal encephalomalacia in the left frontal lobe in  the MCA distribution likely reflecting prior infarcts. Additional hypodensity in the supratentorial white matter likely reflects underlying chronic small-vessel ischemic change The pituitary and suprasellar region are normal. There is no mass lesion. There is no mass effect or midline shift. Vascular: See below. Skull: Normal. Negative for fracture or focal lesion. Sinuses/Orbits: There is mild mucosal thickening in the paranasal sinuses. The globes and orbits are unremarkable. Other: The mastoid air cells and middle ear cavities are clear. Review of the MIP images confirms the above findings CTA NECK FINDINGS Aortic arch: The imaged aortic arch is normal. The origins of the major branch vessels are patent. The subclavian arteries are patent to the level imaged. Right carotid system: The right common, internal, and external carotid arteries are patent, with calcified plaque at the bifurcation resulting in less than 50% stenosis. There is no evidence of dissection or aneurysm. Left carotid system: The left common carotid artery is patent. The internal carotid artery is occluded from the bifurcation throughout the remainder of its course in the neck. The external carotid artery is patent. Vertebral arteries: There is moderate stenosis of the origin of the right vertebral artery. There is scattered calcified plaque in the remainder of the vertebral arteries without other significant stenosis or occlusion there is no evidence of dissection or aneurysm. Skeleton: There is no acute osseous abnormality or suspicious osseous lesion. There is no visible canal hematoma. Other neck: The soft tissues of the neck are unremarkable. Upper chest: The imaged lung apices are clear. There is debris in the esophagus. Review of the MIP images confirms the above findings CTA HEAD FINDINGS Anterior circulation: There is reconstitution of flow in the cavernous segment of the left ICA with linear nonocclusive hypodense filling defect likely  reflecting clot. The proximal M1 segment is patent, but there is occlusion of the M1 segment just after the origin of the anterior temporal branch. There is reconstitution of flow in the M2 branches of the bifurcation. There is mild calcified plaque in the right intracranial ICA without significant stenosis. The right M1 segment and distal branches are patent, without proximal stenosis or occlusion. The bilateral ACAS are patent, without proximal stenosis or occlusion. The anterior communicating artery is normal. There is no aneurysm or AVM. Posterior circulation: The bilateral V4 segments are patent. The basilar artery is patent.  The left PICA origin is not definitely seen. The other major cerebellar arteries appear patent. The bilateral PCAs are patent, without proximal stenosis or occlusion. Small bilateral posterior communicating arteries are identified. There is no aneurysm or AVM. Venous sinuses: Patent. Anatomic variants: None. Review of the MIP images confirms the above findings CT Brain Perfusion Findings: ASPECTS: 10 CBF (<30%) Volume: 13mL Perfusion (Tmax>6.0s) volume: 70mL Mismatch Volume: 57mL Infarction Location:Left MCA distribution. IMPRESSION: 1. Occluded left internal carotid artery from the bifurcation throughout the remainder of its course in the neck. There is reconstitution of flow in the cavernous segment with nonocclusive hypodense filling defect likely reflecting clot. 2. Occluded left M1 segment just after the anterior temporal branch with reconstitution of flow in the M2 segments of the bifurcation. 3. Possible early infarct in the left basal ganglia.  ASPECTS is 9. 4. 13 cc infarct core and 57 cc mismatch in the MCA distribution. 5. Moderate right vertebral artery origin stenosis and mild plaque at the right carotid bifurcation. Findings discussed with Dr Selina Cooley at 2:29 pm. Electronically Signed   By: Lesia Hausen M.D.   On: 03/02/2023 14:40   CT HEAD WO CONTRAST  Result Date:  03/02/2023 CLINICAL DATA:  Neuro deficit, acute, stroke suspected EXAM: CT HEAD WITHOUT CONTRAST TECHNIQUE: Contiguous axial images were obtained from the base of the skull through the vertex without intravenous contrast. RADIATION DOSE REDUCTION: This exam was performed according to the departmental dose-optimization program which includes automated exposure control, adjustment of the mA and/or kV according to patient size and/or use of iterative reconstruction technique. COMPARISON:  None Available. FINDINGS: Brain: Age indeterminate, potentially subacute infarct in the inferior left frontal lobe. No evidence of acute hemorrhage, hydrocephalus, extra-axial collection or mass lesion/mass effect. Additional patchy white matter hypodensities are nonspecific but compatible with chronic microvascular ischemic disease. Cerebral atrophy. Vascular: Question hyperdense appearance of the distal left M1 MCA. Skull: No acute fracture. Sinuses/Orbits: Clear sinuses. No acute orbital findings. Fluid-filled no mastoid effusions. Other: No mastoid effusions. IMPRESSION: 1. Age indeterminate, potentially acute or subacute infarct in the inferior left frontal lobe. Recommend MRI to further evaluate. 2. Question hyperdense appearance of the distal left M1 MCA, which could represent artifact or thrombus. Recommend CTA to exclude LVO. Findings discussed with Melene Plan via telephone at 1:45 PM. Electronically Signed   By: Feliberto Harts M.D.   On: 03/02/2023 13:46    Procedures .Critical Care  Performed by: Melene Plan, DO Authorized by: Melene Plan, DO   Critical care provider statement:    Critical care time (minutes):  35   Critical care time was exclusive of:  Separately billable procedures and treating other patients   Critical care was time spent personally by me on the following activities:  Development of treatment plan with patient or surrogate, discussions with consultants, evaluation of patient's response to  treatment, examination of patient, ordering and review of laboratory studies, ordering and review of radiographic studies, ordering and performing treatments and interventions, pulse oximetry, re-evaluation of patient's condition and review of old charts   Care discussed with: admitting provider       Medications Ordered in ED Medications  sodium chloride 0.9 % bolus 1,000 mL (1,000 mLs Intravenous New Bag/Given 03/02/23 1057)  acetaminophen (TYLENOL) tablet 1,000 mg (1,000 mg Oral Given 03/02/23 1047)  iohexol (OMNIPAQUE) 350 MG/ML injection 100 mL (100 mLs Intravenous Contrast Given 03/02/23 1424)    ED Course/ Medical Decision Making/ A&P  Medical Decision Making Amount and/or Complexity of Data Reviewed Labs: ordered. Radiology: ordered.  Risk OTC drugs. Prescription drug management. Decision regarding hospitalization.   82 yo M with a chief complaints of difficulty with naming.  He has no other obvious neurologic deficit noted.  I wonder if the patient has a encephalopathy secondary to COVID.  Will obtain a laboratory evaluation and CT of the head.  CT scan of the head is concerning for an age-indeterminate stroke.  I discussed this with the radiologist.  There is also concern for a dense M1 segment.  I discussed this with neurology, Dr. Selina Cooley.  Recommend activating as a code stroke.  No significant electrolyte abnormalities no acute anemia.  Patient found to have LVO.  Taken to IR.    Medications given during this visit Medications  sodium chloride 0.9 % bolus 1,000 mL (1,000 mLs Intravenous New Bag/Given 03/02/23 1057)  acetaminophen (TYLENOL) tablet 1,000 mg (1,000 mg Oral Given 03/02/23 1047)  iohexol (OMNIPAQUE) 350 MG/ML injection 100 mL (100 mLs Intravenous Contrast Given 03/02/23 1424)          Final Clinical Impression(s) / ED Diagnoses Final diagnoses:  Acute ischemic left MCA stroke [name])    Rx / DC Orders ED Discharge  Orders     None           Melene Plan, DO 03/02/23 1453

## 2023-03-02 NOTE — Anesthesia Preprocedure Evaluation (Signed)
Anesthesia Evaluation  Preop documentation limited or incomplete due to emergent nature of procedure.  Airway Mallampati: III  TM Distance: >3 FB Neck ROM: Full    Dental no notable dental hx.    Pulmonary asthma    Pulmonary exam normal breath sounds clear to auscultation       Cardiovascular hypertension, + CAD  Normal cardiovascular exam Rhythm:Regular Rate:Normal     Neuro/Psych    GI/Hepatic   Endo/Other  diabetes    Renal/GU      Musculoskeletal  (+) Arthritis ,    Abdominal   Peds  Hematology   Anesthesia Other Findings   Reproductive/Obstetrics                              Anesthesia Physical Anesthesia Plan  ASA: 3 and emergent  Anesthesia Plan: General   Post-op Pain Management:    Induction: Intravenous and Rapid sequence  PONV Risk Score and Plan: Ondansetron and Dexamethasone  Airway Management Planned: Oral ETT  Additional Equipment:   Intra-op Plan:   Post-operative Plan: Extubation in OR  Informed Consent: I have reviewed the patients History and Physical, chart, labs and discussed the procedure including the risks, benefits and alternatives for the proposed anesthesia with the patient or authorized representative who has indicated his/her understanding and acceptance.     Dental advisory given  Plan Discussed with: CRNA  Anesthesia Plan Comments:          Anesthesia Quick Evaluation

## 2023-03-02 NOTE — Code Documentation (Signed)
Stroke Response Nurse Documentation Code Documentation  Robert [name] is a 82 y.o. male arriving to [facility]  via [facility] on 03/02/2023 with past medical hx of HTN, HLD, CAD s/p CABG, prostate cancer, recent sepsis admission related to Covid. On No antithrombotic. Code stroke was activated by ED.   Patient from home where he was LKW at 2100 9/25 and now complaining of trouble speaking. Per wife, patient woke up this morning at 0700 and was having trouble speaking and understanding. He came to the emergency room, after being discharged 9/25 from sepsis related to COVID, and a code stroke was activated for aphasia and inability to follow commands.   Stroke team at the bedside on patient arrival. Labs drawn and patient cleared for CT by Dr. Adela Lank. Patient to CT with team. NIHSS 4, see documentation for details and code stroke times. Patient with disoriented, right facial droop, Expressive aphasia , and dysarthria  on exam. The following imaging was completed:  CT Head and CTA. Patient is not a candidate for IV Thrombolytic due to being outside of thrombolytic window per MD. Patient is a candidate for IR due to LVO noted on imaging.   Care Plan: Patient transferred to IR; Post Code IR order set to be utilized upon arrival to ICU.   Bedside handoff with IR RN Ronaldo Miyamoto.    Felecia Jan  Stroke Response RN

## 2023-03-02 NOTE — Anesthesia Procedure Notes (Signed)
Procedure Name: Intubation Date/Time: 03/02/2023 3:29 PM  Performed by: [place] Nation, MDPre-anesthesia Checklist: Patient identified, Emergency Drugs available, Suction available and Patient being monitored Patient Re-evaluated:Patient Re-evaluated prior to induction Oxygen Delivery Method: Circle system utilized Preoxygenation: Pre-oxygenation with 100% oxygen Induction Type: IV induction Ventilation: Mask ventilation without difficulty Laryngoscope Size: Mac and 4 Grade View: Grade I Tube type: Oral Tube size: 7.5 mm Number of attempts: 1 Airway Equipment and Method: Stylet and Oral airway Placement Confirmation: ETT inserted through vocal cords under direct vision, positive ETCO2 and breath sounds checked- equal and bilateral Secured at: 24 cm Tube secured with: Tape Dental Injury: Teeth and Oropharynx as per pre-operative assessment

## 2023-03-02 NOTE — Procedures (Signed)
INTERVENTIONAL NEURORADIOLOGY BRIEF POSTPROCEDURE NOTE  DIAGNOSTIC CEREBRAL ANGIOGRAM, MECHANICAL THROMBECTOMY, FLAT PANEL HEAD CT, LEFT CAROTID STENTING AND ANGIOPLASTY WITH CEREBRAL PROTECTION DEVICE  Attending physician: Baldemar Lenis, MD  Diagnosis: Left ICA and left M1/MCA occlusion  Access site: Right common femoral artery.  Access closure: Perclose Prostyle.  Anesthesia: IR sedation: General endotracheal anesthesia.  Medication used: cangrelor IV bolus and drip.  Complications: None.  Estimated blood loss:  150 mL.  Specimen: None.  Findings: Occlusion of the left ICA at it's origin in the neck. Mechanical thrombectomy performed with direct contact aspiration with recanalization of the ICA and M1 segment. Occlusion of the M2 branch and underlying cervical ICA stenosis seen. Angioplasty performed followed by mechanical thrombectomy achieving complete recanalization. Delayed carotid reocclusion noted. Mechanical thrombectomy performed with direct contact aspiration with recanalization of the ICA. Cangrelor started. Stenting and angioplasty of the cervical ICA was then carried out. Complete resolution of carotid stenosis and complete intracranial revascularization achieved at the end of the procedure.  The patient tolerated the procedure well without complication. He was transferred to ICU in stable condition. He remained intubated due to positive COVID-19 test.  PLAN: - Bed rest x 6 hours s/p femoral access - Patient may be extubated per CCM evaluation - SBP 120-160 mmHg - Continue cangrelor infusion until decision is made on brilinta loading. Patient scheduled for head CT at 20:00. If no hemorrhagic complication, may load Brilinta 180 mg + ASA 81 mg. Discontinue cangrelor only after loading has been administered. If hemorrhagic complication is noted, please contact myself and/or neurohospitalist.

## 2023-03-02 NOTE — Consult Note (Signed)
NAME:  Robert [name], MRN:  [id number], DOB:  1940-12-10, LOS: 0 ADMISSION DATE:  03/02/2023, CONSULTATION DATE:  03/02/23 REFERRING MD:  Selina Cooley, CHIEF COMPLAINT:  dysarthria   History of Present Illness:  82 year old man w/ hx CAD post CABG, HTN, HLD, recent admit for COVID 9/23-9/25 presenting with trouble with speech and speech comprehension.  Stroke workup revealed occluded L ICA, distal L M1, w/ 13cc core and 57 cc mismatch.  Outside TPA window.  Went to [facility] and underwent R femoral approach L ICA and M1 aspiration thrombectomy complicated by delayed carotid occlusion requiring stenting and cangrelor.  He was left intubated and brought to 4N for further care.  Patient is currently heavily sedated so history per chart review.  Pertinent  Medical History  CAD HLD HTN Prior CABG  Significant Hospital Events: Including procedures, antibiotic start and stop dates in addition to other pertinent events   03/02/23: code stroke, thrombectomy as denoted in HPI  Interim History / Subjective:  Intubated/sated on vent  Objective   Blood pressure (!) 167/82, pulse (!) 106, temperature 97.7 F (36.5 C), resp. rate 16, SpO2 91%.    Vent Mode: PRVC FiO2 (%):  [50 %] 50 % Set Rate:  [20 bmp] 20 bmp Vt Set:  [510 mL] 510 mL PEEP:  [5 cmH20] 5 cmH20 Plateau Pressure:  [22 cmH20] 22 cmH20  No intake or output data in the 24 hours ending 03/02/23 1805 There were no vitals filed for this visit.  Examination: General: sedated on vent, obese HENT: ETT minimal secretions Lungs: clear to auscultation, no wheezing Cardiovascular: rrr, no murmurs Abdomen: soft, non-distended, BS+ Extremities: R groin access site soft Neuro: sedated, PERRL GU: foley  Resolved Hospital Problem list   [name]  Assessment & Plan:  Postprocedural ventilator management S/P left ICA and M1 thrombectomy S/P left ICA stenting CAD with prior CABG Recent COVID +  - CXR, ABG, vent bundle - Let rest on vent tonight  given size/location of stroke - AC and GDMT per neurology team - Would get MRI then SAT/SBT - Will follow with you  Best Practice (right click and "Reselect all SmartList Selections" daily)   Diet/type: NPO DVT prophylaxis: per primary GI prophylaxis: PPI Lines: [name] Foley:  Yes, and it is still needed Code Status:  full code Last date of multidisciplinary goals of care discussion [pending]  Labs   CBC: Recent Labs  Lab 02/27/23 1409 02/28/23 0633 03/02/23 1041 03/02/23 1047  WBC 8.8 6.9 9.9  --   NEUTROABS 7.7  --  7.8*  --   HGB 14.2 14.9 13.9 14.6  HCT 45.1 45.9 43.0 43.0  MCV 99.6 100.7* 96.6  --   PLT 159 143* 249  --     Basic Metabolic Panel: Recent Labs  Lab 02/27/23 1409 02/28/23 0633 03/01/23 0811 03/02/23 1047 03/02/23 1215  NA 134* 135 136 136 137  K 3.6 3.8 4.6 5.6* 3.9  CL 100 98 100 101 104  CO2 25 26 26   --  27  GLUCOSE 120* 152* 187* 117* 132*  BUN 24* 23 36* 57* 34*  CREATININE 1.08 0.95 1.00 0.90 0.95  CALCIUM 8.8* 8.4* 8.8*  --  8.3*  MG  --  2.0 2.1  --   --   PHOS  --  4.5  --   --   --    GFR: Estimated Creatinine Clearance: 68.2 mL/min (by C-G formula based on SCr of 0.95 mg/dL). Recent Labs  Lab  02/27/23 1409 02/27/23 1446 02/27/23 1631 02/27/23 1956 02/28/23 0633 03/02/23 1041  PROCALCITON  --   --   --  1.40  --   --   WBC 8.8  --   --   --  6.9 9.9  LATICACIDVEN  --  1.6 0.6  --   --   --     Liver Function Tests: Recent Labs  Lab 02/27/23 1409 03/02/23 1215  AST 26 27  ALT 31 32  ALKPHOS 86 63  BILITOT 1.1 0.5  PROT 7.1 5.3*  ALBUMIN 3.3* 2.6*   No results for input(s): "LIPASE", "AMYLASE" in the last 168 hours. No results for input(s): "AMMONIA" in the last 168 hours.  ABG    Component Value Date/Time   TCO2 29 03/02/2023 1047     Coagulation Profile: Recent Labs  Lab 02/27/23 1409 02/28/23 0633 03/02/23 1215  INR 1.1 1.0 1.1    Cardiac Enzymes: No results for input(s): "CKTOTAL", "CKMB",  "CKMBINDEX", "TROPONINI" in the last 168 hours.  HbA1C: Hemoglobin A1C  Date/Time Value Ref Range Status  07/21/2021 09:26 AM 6.7 (A) 4.0 - 5.6 % Final   Hgb A1c MFr Bld  Date/Time Value Ref Range Status  05/02/2022 08:15 AM 6.8 (H) 4.6 - 6.5 % Final    Comment:    Glycemic Control Guidelines for People with Diabetes:Non Diabetic:  <6%Goal of Therapy: <7%Additional Action Suggested:  >8%   04/19/2021 10:59 AM 6.9 (H) 4.6 - 6.5 % Final    Comment:    Glycemic Control Guidelines for People with Diabetes:Non Diabetic:  <6%Goal of Therapy: <7%Additional Action Suggested:  >8%     CBG: Recent Labs  Lab 02/27/23 1322  GLUCAP 127*    Review of Systems:   Intubated/sedated  Past Medical History:  He,  has a past medical history of CAD (coronary artery disease), Diverticulosis of colon, ED (erectile dysfunction) of non-organic origin, History of colon polyps, Hyperlipidemia, Hypertension, Nodular prostate with lower urinary tract symptoms, Prostate cancer [facility]) (urologist-  dr wrenn/  oncologist-  dr Kathrynn Running), S/P CABG x 4 (05/02/2005), and Wears glasses.   Surgical History:   Past Surgical History:  Procedure Laterality Date   CARDIAC CATHETERIZATION  04-29-2005  dr w. Samule Ohm   severe 2V CAD-- 90% LAD diagonal, 80% CFx ostium,  50% pRCA   COLONOSCOPY  08/03/2007   per Dr. Russella Dar, benign polyp, he declines to have any more    CORONARY ARTERY BYPASS GRAFT  05-02-2005  dr Zenaida Niece tright   LIMA to LAD,  radial graft to CFx marginal,  SVG to diagonal and RCA   EXICIOSN MASS INDEX FINGER Right 07/2015   per pt benign   PROSTATE BIOPSY  06/15/2016   RADIOACTIVE SEED IMPLANT [name] 10/20/2016   Procedure: RADIOACTIVE SEED IMPLANT/BRACHYTHERAPY IMPLANT, 71 seeds implanted, no seeds found in bladder;  Surgeon: Bjorn Pippin, MD;  Location: [facility];  Service: Urology;  Laterality: [name];     Social History:   reports that he has never smoked. He has never used smokeless tobacco. He  reports that he does not drink alcohol and does not use drugs.   Family History:  His family history includes Cancer in his maternal aunt, maternal grandfather, maternal grandmother, maternal uncle, maternal uncle, maternal uncle, and maternal uncle; Sudden death in an other family member.   Allergies No Known Allergies   Home Medications  Prior to Admission medications   Medication Sig Start Date End Date Taking? Authorizing Provider  albuterol (  VENTOLIN HFA) 108 (90 Base) MCG/ACT inhaler Inhale 2 puffs into the lungs every 6 (six) hours as needed for wheezing or shortness of breath. 03/01/23   Amin, Ankit C, MD  amLODipine (NORVASC) 10 MG tablet Take 1 tablet (10 mg total) by mouth daily. 01/09/23   Nelwyn Salisbury, MD  aspirin 81 MG tablet Take 81 mg by mouth daily.    [provider]  atorvastatin (LIPITOR) 40 MG tablet Take 1 tablet (40 mg total) by mouth daily. 06/08/22   Nelwyn Salisbury, MD  dexamethasone (DECADRON) 4 MG tablet Take 1 tablet (4 mg total) by mouth daily for 4 days. 03/01/23 03/05/23  Amin, Ankit C, MD  doxycycline (VIBRA-TABS) 100 MG tablet Take 1 tablet (100 mg total) by mouth every 12 (twelve) hours for 7 doses. 03/01/23 03/05/23  Amin, Ankit C, MD  latanoprost (XALATAN) 0.005 % ophthalmic solution Place 1 drop into both eyes at bedtime. 01/22/23   [provider]  losartan (COZAAR) 100 MG tablet Take 1 tablet (100 mg total) by mouth daily. 01/12/23   Nelwyn Salisbury, MD  meloxicam (MOBIC) 15 MG tablet Take 1 tablet (15 mg total) by mouth daily. 06/08/22   Nelwyn Salisbury, MD  potassium chloride (KLOR-CON) 10 MEQ tablet Take 1 tablet (10 mEq total) by mouth daily. 10/17/22   Nelwyn Salisbury, MD  tamsulosin (FLOMAX) 0.4 MG CAPS capsule Take 0.4 mg by mouth daily.    [provider]     Critical care time: 35 minutes    Melody Comas, MD [facility] Office: [phone]   See Amion for personal pager PCCM on call pager [phone] until 7pm. Please call Elink 7p-7a. [phone]

## 2023-03-02 NOTE — Transfer of Care (Signed)
Immediate Anesthesia Transfer of Care Note  Patient: Robert [name]  Procedure(s) Performed: RADIOLOGY WITH ANESTHESIA  Patient Location: SICU  Anesthesia Type:General  Level of Consciousness: sedated  Airway & Oxygen Therapy: Patient remains intubated per anesthesia plan  Post-op Assessment: Report given to RN and Post -op Vital signs reviewed and stable  Post vital signs: Reviewed and stable  Last Vitals:  Vitals Value Taken Time  BP 127/49 03/02/23 1800  Temp    Pulse 59 03/02/23 1804  Resp 20 03/02/23 1804  SpO2 100 % 03/02/23 1804  Vitals shown include unfiled device data.  Last Pain:  Vitals:   03/02/23 1356  PainSc: 0-No pain         Complications: There were no known notable events for this encounter.

## 2023-03-02 NOTE — Progress Notes (Signed)
An USGPIV (ultrasound guided PIV) has been placed for short-term vasopressor infusion. A correctly placed ivWatch must be used when administering Vasopressors. Should this treatment be needed beyond 24 hours, central line access should be obtained.  It will be the responsibility of the bedside nurse to follow best practice to prevent extravasations.

## 2023-03-02 NOTE — H&P (Signed)
Neurology H&P  ZO:[id number] speaking   History is obtained from:medical record and wife at the bedside  HPI: Robert [name] is a 82 y.o. male with past medical history of hypertension, hyperlipidemia, CAD s/p CABG, history of prostate cancer, with recent admission for sepsis related to COVID 19 infection was discharged 9/25.  He presents to the emergency room today via EMS for evaluation having trouble speaking and understanding per wife.    Code stroke was activated 1403 in the emergency room for aphasia and inability to follow commands.  NIH 4.  Patient denies any weakness, numbness, vision changes or headache.  Last known well 2100 on 9/25 patient awoke this a.m. at 07:00 with trouble getting his words out and following commands with some slurred speech.  Wife called EMS and brought him to the emergency room  At 1110 CT head with age-indeterminate potential acute or subacute infarct in the left frontal lobe, hyperdense appearance of distal left M1.  Code stroke CT head with suspected early infarct in left basal ganglia, aspects 9  CTA head and neck with occluded left ICA, occluded distal left M1.  Perfusion with 13 cc infarct core and 57 cc mismatch Code  IR activated at 1445.   LKW: 2100 on 9/25 tpa given?: no, outside window EVT: Yes occluded left ICA and M1 Modified Rankin Scale: 0-Completely asymptomatic and back to baseline post- stroke  ROS: A 14 point ROS was performed and is negative except as noted in the HPI.   Past Medical History:  Diagnosis Date   CAD (coronary artery disease)    per pt cardiologist dr Eden Emms, last office visit 02-05-2010, currently followed by pcp , dr fry   Diverticulosis of colon    ED (erectile dysfunction) of non-organic origin    History of colon polyps    02/ 2009 benign   Hyperlipidemia    Hypertension    Nodular prostate with lower urinary tract symptoms    Prostate cancer [facility]) urologist-  dr wrenn/  oncologist-  dr Kathrynn Running   dx  06-15-2016,  Stage T2a, Gleason 3+3,  PSA 4.64,  vol 23.4cc   S/P CABG x 4 05/02/2005   LIMA to LAD,  radial graft to CFx marginal,  SVG to diagonal and RCA   Wears glasses      Family History  Problem Relation Age of Onset   Sudden death Other    Cancer Maternal Aunt        ovarian   Cancer Maternal Uncle        prostate   Cancer Maternal Grandmother        breast   Cancer Maternal Grandfather        prostate   Cancer Maternal Uncle        prostate   Cancer Maternal Uncle        prostate   Cancer Maternal Uncle        prostate     Social History:  reports that he has never smoked. He has never used smokeless tobacco. He reports that he does not drink alcohol and does not use drugs.   Exam: Current vital signs: BP (!) 167/82   Pulse (!) 106   Temp 97.7 F (36.5 C)   Resp 16   SpO2 91%  Vital signs in last 24 hours: Temp:  [97.7 F (36.5 C)] 97.7 F (36.5 C) (09/26 1031) Pulse Rate:  [87-106] 106 (09/26 1421) Resp:  [14-21] 16 (09/26 1421) BP: (142-167)/(66-82) 167/82 (09/26 1421)  SpO2:  [91 %-99 %] 91 % (09/26 1421)  Physical Exam  Constitutional: Appears well-developed and well-nourished.  Psych: Affect appropriate to situation Eyes: No scleral injection HENT: No OP obstrucion Head: Normocephalic.  Cardiovascular: Normal rate and regular rhythm.  Respiratory: Effort normal and breath sounds normal to anterior ascultation GI: Soft.  No distension. There is no tenderness.  Skin: WDI  Neuro: Mental Status: Patient is awake, alert, oriented to person, place, month, year, and situation. Patient with expressive> receptive aphasia Cranial Nerves: II: Visual Fields are full. Pupils are equal, round, and reactive to light.   III,IV, VI: EOMI without ptosis or diploplia.  V: Facial sensation is symmetric to temperature VII: Right facial droop VIII: hearing is intact to voice X: Uvula elevates symmetrically XI: Shoulder shrug is symmetric. XII: tongue is  midline without atrophy or fasciculations.  Motor: Tone is normal. Bulk is normal. 5/5 strength was present in all four extremities.  Sensory: Sensation is symmetric to light touch and temperature in the arms and legs. Cerebellar: FNF and HKS are intact bilaterally.  Has a tremor in the left arm   I have reviewed labs in epic and the results pertinent to this consultation are: Unremarkable  I have reviewed the images obtained:  1110 CT head with age-indeterminate potential acute or subacute infarct in the left frontal lobe, hyperdense appearance of distal left M1.  Code stroke CT head with suspected early infarct in left basal ganglia, aspects 9  CTA head and neck with occluded left ICA, occluded left M1.  Perfusion with 13 cc infarct core and 57 cc mismatch  Primary Diagnosis:  Acute left MCA ischemic infarct   Secondary Diagnosis: Essential (primary) hypertension COVID-19 infection  Recommendations: -Admit to neuro ICU -Bedside swallow screen.  If passes may have a heart healthy diet -BP goal per IR orders -Cleviprex to maintain BP goal - HgbA1c, fasting lipid panel - MRI of the brain without contrast - Frequent neuro checks - Echocardiogram - Risk factor modification - Telemetry monitoring - PT consult, OT consult, Speech consult -Check UA and UDS -AM labs BMP, CBC, Mg, phos -statin  - Stroke team to follow   Gevena Mart DNP, ACNPC-AG  [facility]   Attending Neurohospitalist Addendum Patient seen and examined with APP/Resident. Agree with the history and physical as documented above. Agree with the plan as documented, which I helped formulate. I have edited the note above to reflect my full findings and recommendations. I have independently reviewed the chart, obtained history, review of systems and examined the patient.I have personally reviewed pertinent head/neck/spine imaging (CT/MRI). Please feel free to call with any questions.  This  patient is critically ill and at significant risk of neurological worsening, death and care requires constant monitoring of vital signs, hemodynamics,respiratory and cardiac monitoring, neurological assessment, discussion with family, other specialists and medical decision making of high complexity. I spent 95 minutes of neurocritical care time  in the care of  this patient. This was time spent independent of any time provided by nurse practitioner or PA.  Bing Neighbors, MD [facility] [phone]  If 7pm- 7am, please page neurology on call as listed in AMION.

## 2023-03-02 NOTE — Sedation Documentation (Signed)
Verbal report given to 4N prior to leaving IR.  Bedside handoff/review given at 1750.  Patients groin site level 0, dressing of gauze and tegaderm is clean/dry/intact. Pulses unchanged.

## 2023-03-02 NOTE — ED Notes (Signed)
Code Stroke activated per verbal order of Dr. Adela Lank

## 2023-03-02 NOTE — Anesthesia Postprocedure Evaluation (Signed)
Anesthesia Post Note  Patient: Robert [name]  Procedure(s) Performed: RADIOLOGY WITH ANESTHESIA     Patient location during evaluation: PACU Anesthesia Type: General Level of consciousness: awake and alert Pain management: pain level controlled Vital Signs Assessment: post-procedure vital signs reviewed and stable Respiratory status: spontaneous breathing, nonlabored ventilation, respiratory function stable and patient connected to nasal cannula oxygen Cardiovascular status: blood pressure returned to baseline and stable Postop Assessment: no apparent nausea or vomiting Anesthetic complications: no   There were no known notable events for this encounter.  Last Vitals:  Vitals:   03/02/23 1400 03/02/23 1421  BP:  (!) 167/82  Pulse: 91 (!) 106  Resp: (!) 21 16  Temp:    SpO2: 92% 91%    Last Pain:  Vitals:   03/02/23 1356  PainSc: 0-No pain    LLE Motor Response: No movement to painful stimulus (03/02/23 1756)   RLE Motor Response: No movement to painful stimulus (03/02/23 1756)         [place]

## 2023-03-02 NOTE — ED Triage Notes (Addendum)
Pt BIB [facility] with reports of aphasia and "inability to identify objects or follow certain commands." Pt LKW 2100 last night. Dr. Adela Lank at the bedside Cbg 118

## 2023-03-02 NOTE — Progress Notes (Signed)
   Patient transported from 4N22 to CT back to 4N22. RN & RT. No complications. ETT secured. Will continue to monitor.

## 2023-03-03 ENCOUNTER — Other Ambulatory Visit (HOSPITAL_COMMUNITY): Payer: Self-pay

## 2023-03-03 ENCOUNTER — Encounter (HOSPITAL_COMMUNITY): Payer: Self-pay | Admitting: Radiology

## 2023-03-03 ENCOUNTER — Inpatient Hospital Stay (HOSPITAL_COMMUNITY): Payer: PPO

## 2023-03-03 DIAGNOSIS — I63032 Cerebral infarction due to thrombosis of left carotid artery: Secondary | ICD-10-CM | POA: Diagnosis not present

## 2023-03-03 DIAGNOSIS — I6389 Other cerebral infarction: Secondary | ICD-10-CM

## 2023-03-03 DIAGNOSIS — Z978 Presence of other specified devices: Secondary | ICD-10-CM

## 2023-03-03 LAB — BASIC METABOLIC PANEL
Anion gap: 7 (ref 5–15)
BUN: 28 mg/dL — ABNORMAL HIGH (ref 8–23)
CO2: 22 mmol/L (ref 22–32)
Calcium: 7.9 mg/dL — ABNORMAL LOW (ref 8.9–10.3)
Chloride: 109 mmol/L (ref 98–111)
Creatinine, Ser: 0.91 mg/dL (ref 0.61–1.24)
GFR, Estimated: >60 mL/min (ref >60)
Glucose, Bld: 166 mg/dL — ABNORMAL HIGH (ref 70–99)
Potassium: 3.8 mmol/L (ref 3.5–5.1)
Sodium: 138 mmol/L (ref 135–145)

## 2023-03-03 LAB — TRIGLYCERIDES: Triglycerides: 242 mg/dL — ABNORMAL HIGH (ref <150)

## 2023-03-03 LAB — LIPID PANEL
Cholesterol: 114 mg/dL (ref 0–200)
HDL: 17 mg/dL — ABNORMAL LOW (ref >40)
LDL Cholesterol: 46 mg/dL (ref 0–99)
Total CHOL/HDL Ratio: 6.7 {ratio}
Triglycerides: 253 mg/dL — ABNORMAL HIGH (ref <150)
VLDL: 51 mg/dL — ABNORMAL HIGH (ref 0–40)

## 2023-03-03 LAB — CBC
HCT: 38.3 % — ABNORMAL LOW (ref 39.0–52.0)
Hemoglobin: 12.7 g/dL — ABNORMAL LOW (ref 13.0–17.0)
MCH: 32.4 pg (ref 26.0–34.0)
MCHC: 33.2 g/dL (ref 30.0–36.0)
MCV: 97.7 fL (ref 80.0–100.0)
Platelets: 306 10*3/uL (ref 150–400)
RBC: 3.92 MIL/uL — ABNORMAL LOW (ref 4.22–5.81)
RDW: 13.4 % (ref 11.5–15.5)
WBC: 10.4 10*3/uL (ref 4.0–10.5)
nRBC: 0 % (ref 0.0–0.2)

## 2023-03-03 LAB — ECHOCARDIOGRAM COMPLETE
AR max vel: 2.05 cm2
AV Peak grad: 7.2 mm[Hg]
Ao pk vel: 1.34 m/s
Area-P 1/2: 3.85 cm2
MV M vel: 4.15 m/s
MV Peak grad: 68.9 mm[Hg]
S' Lateral: 2.3 cm
Weight: 3682.56 [oz_av]

## 2023-03-03 LAB — HEMOGLOBIN A1C
Hgb A1c MFr Bld: 6.6 % — ABNORMAL HIGH (ref 4.8–5.6)
Mean Plasma Glucose: 142.72 mg/dL

## 2023-03-03 LAB — GLUCOSE, CAPILLARY: Glucose-Capillary: 128 mg/dL — ABNORMAL HIGH (ref 70–99)

## 2023-03-03 LAB — PHOSPHORUS: Phosphorus: 2.3 mg/dL — ABNORMAL LOW (ref 2.5–4.6)

## 2023-03-03 LAB — MAGNESIUM: Magnesium: 2 mg/dL (ref 1.7–2.4)

## 2023-03-03 MED ORDER — CALCIUM GLUCONATE-NACL 2-0.675 GM/100ML-% IV SOLN
2.0000 g | Freq: Once | INTRAVENOUS | Status: AC
  Administered 2023-03-03: 2000 mg via INTRAVENOUS
  Filled 2023-03-03: qty 100

## 2023-03-03 MED ORDER — INSULIN ASPART 100 UNIT/ML IJ SOLN
0.0000 [IU] | INTRAMUSCULAR | Status: DC
  Administered 2023-03-03 – 2023-03-04 (×2): 1 [IU] via SUBCUTANEOUS
  Administered 2023-03-04: 2 [IU] via SUBCUTANEOUS
  Administered 2023-03-04: 1 [IU] via SUBCUTANEOUS

## 2023-03-03 MED ORDER — DOCUSATE SODIUM 50 MG/5ML PO LIQD
100.0000 mg | Freq: Two times a day (BID) | ORAL | Status: DC
  Administered 2023-03-04 – 2023-03-06 (×3): 100 mg via ORAL
  Filled 2023-03-03 (×4): qty 10

## 2023-03-03 MED ORDER — BETHANECHOL CHLORIDE 10 MG PO TABS
10.0000 mg | ORAL_TABLET | Freq: Three times a day (TID) | ORAL | Status: DC
  Administered 2023-03-03 – 2023-03-04 (×2): 10 mg via ORAL
  Filled 2023-03-03 (×2): qty 1

## 2023-03-03 MED ORDER — TICAGRELOR 90 MG PO TABS
90.0000 mg | ORAL_TABLET | Freq: Two times a day (BID) | ORAL | Status: DC
  Administered 2023-03-03 – 2023-03-06 (×6): 90 mg via ORAL
  Filled 2023-03-03 (×6): qty 1

## 2023-03-03 MED ORDER — POLYETHYLENE GLYCOL 3350 17 G PO PACK
17.0000 g | PACK | Freq: Every day | ORAL | Status: DC
  Administered 2023-03-05 – 2023-03-06 (×2): 17 g via ORAL
  Filled 2023-03-03 (×2): qty 1

## 2023-03-03 MED ORDER — POTASSIUM PHOSPHATES 15 MMOLE/5ML IV SOLN
15.0000 mmol | Freq: Once | INTRAVENOUS | Status: AC
  Administered 2023-03-03: 15 mmol via INTRAVENOUS
  Filled 2023-03-03: qty 5

## 2023-03-03 MED ORDER — BETHANECHOL CHLORIDE 10 MG PO TABS
10.0000 mg | ORAL_TABLET | Freq: Three times a day (TID) | ORAL | Status: DC
  Administered 2023-03-03: 10 mg
  Filled 2023-03-03 (×2): qty 1

## 2023-03-03 MED ORDER — ORAL CARE MOUTH RINSE: 15.0000 mL | OROMUCOSAL | Status: DC | PRN

## 2023-03-03 MED ORDER — ASPIRIN 81 MG PO CHEW
81.0000 mg | CHEWABLE_TABLET | Freq: Every day | ORAL | Status: DC
  Administered 2023-03-03: 81 mg
  Filled 2023-03-03: qty 1

## 2023-03-03 MED ORDER — ATORVASTATIN CALCIUM 40 MG PO TABS
40.0000 mg | ORAL_TABLET | Freq: Every day | ORAL | Status: DC
  Administered 2023-03-03: 40 mg
  Filled 2023-03-03: qty 1

## 2023-03-03 MED ORDER — ASPIRIN 81 MG PO CHEW
81.0000 mg | CHEWABLE_TABLET | Freq: Every day | ORAL | Status: DC
  Administered 2023-03-04 – 2023-03-06 (×3): 81 mg via ORAL
  Filled 2023-03-03 (×3): qty 1

## 2023-03-03 MED ORDER — ATORVASTATIN CALCIUM 40 MG PO TABS
40.0000 mg | ORAL_TABLET | Freq: Every day | ORAL | Status: DC
  Administered 2023-03-04 – 2023-03-06 (×3): 40 mg via ORAL
  Filled 2023-03-03 (×3): qty 1

## 2023-03-03 MED ORDER — TICAGRELOR 90 MG PO TABS
90.0000 mg | ORAL_TABLET | Freq: Two times a day (BID) | ORAL | Status: DC
  Administered 2023-03-03: 90 mg
  Filled 2023-03-03: qty 1

## 2023-03-03 NOTE — Progress Notes (Signed)
NAME:  Robert [name], MRN:  [id number], DOB:  07/16/1940, LOS: 1 ADMISSION DATE:  03/02/2023, CONSULTATION DATE:  03/02/23 REFERRING MD:  Selina Cooley, CHIEF COMPLAINT:  dysarthria   History of Present Illness:  82 year old man w/ hx CAD post CABG, HTN, HLD, recent admit for COVID 9/23-9/25 presenting with trouble with speech and speech comprehension.  Stroke workup revealed occluded L ICA, distal L M1, w/ 13cc core and 57 cc mismatch.  Outside TPA window.  Went to [facility] and underwent R femoral approach L ICA and M1 aspiration thrombectomy complicated by delayed carotid occlusion requiring stenting and cangrelor.  He was left intubated and brought to 4N for further care.  Patient is currently heavily sedated so history per chart review.  Pertinent  Medical History  CAD HLD HTN Prior CABG  Significant Hospital Events: Including procedures, antibiotic start and stop dates in addition to other pertinent events   Previous hospitalization 9/23-9/25 Admitted for sepsis secondary to COVID-19. Treated with empiric doxycycline and decadron   03/02/23 Admitted as code stroke, thrombectomy as denoted in HPI 9/27 No acute issues overnight, following commands on vent this am   Interim History / Subjective:  Following commands on vent  Family at bedside and updated   Objective   Blood pressure (!) 138/59, pulse (!) 48, temperature (!) 97.2 F (36.2 C), temperature source Axillary, resp. rate (!) 3, weight 104.4 kg, SpO2 100%.    Vent Mode: PRVC FiO2 (%):  [40 %-50 %] 40 % Set Rate:  [20 bmp] 20 bmp Vt Set:  [510 mL] 510 mL PEEP:  [5 cmH20] 5 cmH20 Plateau Pressure:  [19 cmH20-25 cmH20] 19 cmH20   Intake/Output Summary (Last 24 hours) at 03/03/2023 0920 Last data filed at 03/03/2023 0800 Gross per 24 hour  Intake 1962.8 ml  Output 1550 ml  Net 412.8 ml   Filed Weights   03/02/23 2000  Weight: 104.4 kg    Examination: General: Acute on chronically ill appearing elderly male lying in bed on  mechanical ventilation, in NAD HEENT: ETT, MM pink/moist, PERRL,  Neuro: Following commands on vent, no-focal  CV: s1s2 regular rate and rhythm, no murmur, rubs, or gallops,  PULM:  Clear to auscultation, no increased work of breathing, no added breath sounds  GI: soft, bowel sounds active in all 4 quadrants, non-tender, non-distended Extremities: warm/dry, no edema  Skin: no rashes or lesions  Resolved Hospital Problem list   [name]  Assessment & Plan:  Postprocedural ventilator management Recent COVID  -9/23-9/25 Admitted for sepsis secondary to COVID-19. Treated with empiric doxycycline and decadron  P: Plan to extubate post MRI scheduled at 1000 Continue ventilator support with lung protective strategies  Wean PEEP and FiO2 for sats greater than 90%. Head of bed elevated 30 degrees. Plateau pressures less than 30 cm H20.  Follow intermittent chest x-ray and ABG.   SAT/SBT as tolerated, mentation preclude extubation  Ensure adequate pulmonary hygiene  Follow cultures  VAP bundle in place  PAD protocol No acute need for continuation of Decadron or Doxy at this time   Acute left MCA ischemic infarct  S/P left ICA and M1 thrombectomy S/P left ICA stenting P: Management per neurology  Maintain neuro protective measures Nutrition and bowel regiment  Seizure precautions  Aspirations precautions  Secondary stoke prevention  MRI this am  Continue ASA  CAD with prior CABG Essential hypertension -Home medication includes Norvasc, aspirin, Lipitor, Cozaar P: Continuous telemetry  ASA and statin Strict intake and output  Daily weight to assess volume status Daily assessment for need to diurese Closely monitor renal function and electrolytes  ECHO pending   Best Practice (right click and "Reselect all SmartList Selections" daily)   Diet/type: NPO DVT prophylaxis: per primary GI prophylaxis: PPI Lines: [name] Foley:  Yes, and it is still needed Code Status:  full  code Last date of multidisciplinary goals of care discussion [pending]  Critical care time:    Performed by: Larence Thone D. Harris  Total critical care time: 38 minutes  Critical care time was exclusive of separately billable procedures and treating other patients.  Critical care was necessary to treat or prevent imminent or life-threatening deterioration.  Critical care was time spent personally by me on the following activities: development of treatment plan with patient and/or surrogate as well as nursing, discussions with consultants, evaluation of patient's response to treatment, examination of patient, obtaining history from patient or surrogate, ordering and performing treatments and interventions, ordering and review of laboratory studies, ordering and review of radiographic studies, pulse oximetry and re-evaluation of patient's condition.  Quinne Pires D. Harris, NP-C [facility] Personal contact information can be found on Amion  If no contact or response made please call [phone] 03/03/2023, 9:27 AM

## 2023-03-03 NOTE — Progress Notes (Signed)
OT Cancellation Note  Patient Details Name: Robert [name] MRN: [id number] DOB: Nov 01, 1940   Cancelled Treatment:    Reason Eval/Treat Not Completed: Active bedrest order. Pt intubated/sedated. Will follow as see as able when appropriate.   Barry Brunner, OT Acute Rehabilitation Services Office [phone]   Chancy Milroy 03/03/2023, 7:20 AM

## 2023-03-03 NOTE — TOC Benefit Eligibility Note (Signed)
Patient Product/process development scientist completed.    The patient is insured through [employer]/ [employer]. Patient has Medicare and is not eligible for a copay card, but may be able to apply for patient assistance, if available.    Ran test claim for Brilinta 90 mg and the current 30 day co-pay is $47.00.   This test claim was processed through [facility]- copay amounts may vary at other pharmacies due to pharmacy/plan contracts, or as the patient moves through the different stages of their insurance plan.     Roland Earl, CPHT Pharmacy Technician III Certified Patient Advocate [facility] Pharm[facility] Patient Advocate Team Direct Number: [phone]  Fax: [phone]

## 2023-03-03 NOTE — Progress Notes (Signed)
Pt transported from 4N18 to MRI and back by RT, RN and transport w/o complications

## 2023-03-03 NOTE — Progress Notes (Signed)
Echocardiogram 2D Echocardiogram has been performed.  Lucendia Herrlich 03/03/2023, 12:31 PM

## 2023-03-03 NOTE — Progress Notes (Signed)
PT Cancellation Note  Patient Details Name: ZAKARIYYA HELFMAN MRN: [id number] DOB: 27-Jun-1940   Cancelled Treatment:    Reason Eval/Treat Not Completed: Medical issues which prohibited therapy;Active bedrest order (Pt vented and sedated, active bed rest order. Will follow up at later date/time as pt medically ready and schedule allows.)   Renaldo Fiddler PT, DPT Acute Rehabilitation Services Office [phone]  03/03/23 9:20 AM

## 2023-03-03 NOTE — Progress Notes (Signed)
Pharmacy Electrolyte Replacement  Recent Labs:  Recent Labs    03/03/23 0721  K 3.8  MG 2.0  PHOS 2.3*  CREATININE 0.91    Low Critical Values (K </= 2.5, Phos </= 1, Mg </= 1) Present: None  Plan: Replace Kphos 15 mmol IV x1

## 2023-03-03 NOTE — Progress Notes (Signed)
Referring Physician(s): Code Stroke  Supervising Physician: Baldemar Lenis  Patient Status:  [facility] - In-pt  Chief Complaint: L ICA, L MCA occlusion  Subjective: Patient s/p mechanical thrombectomy, angioplasty, and stent placement of ICA stenosis.  Ultimately was able to be loaded with Brilinta 180mg .   Extubated this afternoon.  Doing well, conversant, moving all extremities.   Allergies: Patient has no known allergies.  Medications: Prior to Admission medications   Medication Sig Start Date End Date Taking? Authorizing Provider  albuterol (VENTOLIN HFA) 108 (90 Base) MCG/ACT inhaler Inhale 2 puffs into the lungs every 6 (six) hours as needed for wheezing or shortness of breath. 03/01/23   Amin, Ankit C, MD  amLODipine (NORVASC) 10 MG tablet Take 1 tablet (10 mg total) by mouth daily. 01/09/23   Nelwyn Salisbury, MD  aspirin 81 MG tablet Take 81 mg by mouth daily.    [provider]  atorvastatin (LIPITOR) 40 MG tablet Take 1 tablet (40 mg total) by mouth daily. 06/08/22   Nelwyn Salisbury, MD  dexamethasone (DECADRON) 4 MG tablet Take 1 tablet (4 mg total) by mouth daily for 4 days. 03/01/23 03/05/23  Amin, Ankit C, MD  doxycycline (VIBRA-TABS) 100 MG tablet Take 1 tablet (100 mg total) by mouth every 12 (twelve) hours for 7 doses. 03/01/23 03/05/23  Amin, Ankit C, MD  latanoprost (XALATAN) 0.005 % ophthalmic solution Place 1 drop into both eyes at bedtime. 01/22/23   [provider]  losartan (COZAAR) 100 MG tablet Take 1 tablet (100 mg total) by mouth daily. 01/12/23   Nelwyn Salisbury, MD  meloxicam (MOBIC) 15 MG tablet Take 1 tablet (15 mg total) by mouth daily. 06/08/22   Nelwyn Salisbury, MD  potassium chloride (KLOR-CON) 10 MEQ tablet Take 1 tablet (10 mEq total) by mouth daily. 10/17/22   Nelwyn Salisbury, MD  tamsulosin (FLOMAX) 0.4 MG CAPS capsule Take 0.4 mg by mouth daily.    [provider]     Vital Signs: BP 139/63 (BP Location: Left Arm)    Pulse (!) 59   Temp (!) 97.2 F (36.2 C) (Axillary)   Resp 17   Wt 230 lb 2.6 oz (104.4 kg)   SpO2 99%   BMI 37.15 kg/m   Physical Exam Vitals and nursing note reviewed.   Alert, aware and oriented Speech and comprehension appear overall intact.  Can spontaneously move all 4 extremities.  Groin soft, site intact.  No evidence of hematoma or pseudoaneurysm.  Pulses: distal pulses intact.   Imaging: ECHOCARDIOGRAM COMPLETE  Result Date: 03/03/2023    ECHOCARDIOGRAM REPORT   Patient Name:   Robert [name] Date of Exam: 03/03/2023 Medical Rec #:  [id number]         Height:       66.0 in Accession #:    [id number]        Weight:       230.2 lb Date of Birth:  March 06, 1941         BSA:          2.123 m Patient Age:    82 years          BP:           149/57 mmHg Patient Gender: M                 HR:           53 bpm. Exam Location:  Inpatient Procedure: 2D  Echo, Cardiac Doppler and Color Doppler Indications:    Stroke I63.9  History:        Patient has no prior history of Echocardiogram examinations.                 CAD, Prior CABG, Stroke; Risk Factors:Hypertension, Diabetes and                 Dyslipidemia.  Sonographer:    Lucendia Herrlich RCS Referring Phys: [phone] DENISE A WOLFE IMPRESSIONS  1. Left ventricular ejection fraction, by estimation, is 60 to 65%. The left ventricle has normal function. The left ventricle has no regional wall motion abnormalities. Left ventricular diastolic parameters are consistent with Grade I diastolic dysfunction (impaired relaxation).  2. Right ventricular systolic function is normal. The right ventricular size is normal. There is normal pulmonary artery systolic pressure.  3. The mitral valve is normal in structure. Trivial mitral valve regurgitation. No evidence of mitral stenosis. Moderate mitral annular calcification.  4. The aortic valve is calcified. There is mild calcification of the aortic valve. There is mild thickening of the aortic valve. Aortic valve  regurgitation is not visualized. No aortic stenosis is present.  5. The inferior vena cava is normal in size with greater than 50% respiratory variability, suggesting right atrial pressure of 3 mmHg. FINDINGS  Left Ventricle: Left ventricular ejection fraction, by estimation, is 60 to 65%. The left ventricle has normal function. The left ventricle has no regional wall motion abnormalities. The left ventricular internal cavity size was normal in size. There is  no left ventricular hypertrophy. Left ventricular diastolic parameters are consistent with Grade I diastolic dysfunction (impaired relaxation). Indeterminate filling pressures. Right Ventricle: The right ventricular size is normal. No increase in right ventricular wall thickness. Right ventricular systolic function is normal. There is normal pulmonary artery systolic pressure. The tricuspid regurgitant velocity is 2.67 m/s, and  with an assumed right atrial pressure of 3 mmHg, the estimated right ventricular systolic pressure is 31.5 mmHg. Left Atrium: Left atrial size was normal in size. Right Atrium: Right atrial size was normal in size. Pericardium: There is no evidence of pericardial effusion. Mitral Valve: The mitral valve is normal in structure. Moderate mitral annular calcification. Trivial mitral valve regurgitation. No evidence of mitral valve stenosis. Tricuspid Valve: The tricuspid valve is normal in structure. Tricuspid valve regurgitation is mild . No evidence of tricuspid stenosis. Aortic Valve: The aortic valve is calcified. There is mild calcification of the aortic valve. There is mild thickening of the aortic valve. Aortic valve regurgitation is not visualized. No aortic stenosis is present. Aortic valve peak gradient measures 7.2 mmHg. Pulmonic Valve: The pulmonic valve was normal in structure. Pulmonic valve regurgitation is not visualized. No evidence of pulmonic stenosis. Aorta: The aortic root is normal in size and structure. Venous: The  inferior vena cava is normal in size with greater than 50% respiratory variability, suggesting right atrial pressure of 3 mmHg. IAS/Shunts: No atrial level shunt detected by color flow Doppler.  LEFT VENTRICLE PLAX 2D LVIDd:         3.70 cm   Diastology LVIDs:         2.30 cm   LV e' medial:    6.22 cm/s LV PW:         1.29 cm   LV E/e' medial:  12.3 LV IVS:        1.30 cm   LV e' lateral:   8.41 cm/s LVOT diam:  2.00 cm   LV E/e' lateral: 9.1 LV SV:         58 LV SV Index:   27 LVOT Area:     3.14 cm  RIGHT VENTRICLE RV S prime:     6.98 cm/s TAPSE (M-mode): 1.1 cm LEFT ATRIUM           Index        RIGHT ATRIUM           Index LA diam:      3.90 cm 1.84 cm/m   RA Area:     13.40 cm LA Vol (A2C): 54.4 ml 25.63 ml/m  RA Volume:   30.10 ml  14.18 ml/m LA Vol (A4C): 60.8 ml 28.64 ml/m  AORTIC VALVE AV Area (Vmax): 2.05 cm AV Vmax:        134.00 cm/s AV Peak Grad:   7.2 mmHg LVOT Vmax:      87.45 cm/s LVOT Vmean:     55.275 cm/s LVOT VTI:       0.186 m  AORTA Ao Root diam: 2.90 cm Ao Asc diam:  3.50 cm MITRAL VALVE               TRICUSPID VALVE MV Area (PHT): 3.85 cm    TR Peak grad:   28.5 mmHg MV Decel Time: 197 msec    TR Vmax:        267.00 cm/s MR Peak grad: 68.9 mmHg MR Vmax:      415.00 cm/s  SHUNTS MV E velocity: 76.30 cm/s  Systemic VTI:  0.19 m MV A velocity: 75.40 cm/s  Systemic Diam: 2.00 cm MV E/A ratio:  1.01 Chilton Si MD Electronically signed by Chilton Si MD Signature Date/Time: 03/03/2023/3:20:20 PM    Final    MR BRAIN WO CONTRAST  Result Date: 03/03/2023 CLINICAL DATA:  Provided history: Neuro deficit, acute, stroke suspected. EXAM: MRI HEAD WITHOUT CONTRAST TECHNIQUE: Multiplanar, multiecho pulse sequences of the brain and surrounding structures were obtained without intravenous contrast. COMPARISON:  Prior head CT examinations 03/02/2023. CT angiogram head/neck 03/02/2023. Interventional radiology report from 03/02/2023. FINDINGS: Brain: Mild generalized parenchymal  atrophy. 6 mm acute infarct at the junction of the left external capsule and putamen posteriorly (for instance as seen on series 2, image 23). Small to moderate-sized chronic cortical/subcortical infarct within the anterior left frontal lobe and anterior left insula. Small chronic cortically-based infarcts within the left frontoparietal operculum and more posteriorly within the left parietal lobe. Background moderate multifocal T2 FLAIR hyperintense signal abnormality within the cerebral white matter, nonspecific but compatible with chronic small vessel ischemic disease. Chronic lacunar infarct within the left lentiform nucleus. No evidence of an intracranial mass. No chronic intracranial blood products. No extra-axial fluid collection. No midline shift. Vascular: Maintained flow voids within the proximal large arterial vessels. Skull and upper cervical spine: No focal suspicious marrow lesion. Sinuses/Orbits: No mass or acute finding within the imaged orbits. Mild paranasal sinus mucosal thickening. Possible small fluid levels within the sphenoid sinuses. Impression #1 will be called to the ordering clinician or representative by the Radiologist Assistant, and communication documented in the PACS or [employer]. IMPRESSION: 1. 6 mm acute infarct at the junction of the left external capsule and left putamen. 2. Chronic cortically-based left MCA territory infarcts as described. 3. Background moderate cerebral white matter chronic small vessel ischemic disease. 4. Chronic lacunar infarct within the left lentiform nucleus. 5. Mild generalized cerebral atrophy. 6. Paranasal sinus disease as described. Electronically Signed  By: Jackey Loge D.O.   On: 03/03/2023 12:15   IR PERCUTANEOUS ART THROMBECTOMY/INFUSION INTRACRANIAL INC DIAG ANGIO  Result Date: 03/03/2023 INDICATION: 82 year old male presenting to the emergency room with receptive and expressive aphasia; NIHSS 4. His last known well was 9 p.m. on  03/01/2023. This past medical history significant for hypertension, hyperlipidemia, CAD s/p CABG, history of prostate cancer, with recent admission for sepsis related to COVID-19 infection, discharged on 03/01/23. Premorbid modified Rankin scale 0. Initial head CT showed hyperdense left MCA and chronic ischemic findings. Repeat head CT showed hypodensity within the left basal ganglia. CT angiogram of the head and neck showed an occlusion of the left internal carotid artery at the bifurcation in the neck with reconstitution at the cavernous segment and occlusion of the distal left M1/MCA segment. He was taking emergently to our service for mechanical thrombectomy. EXAM: ULTRASOUND-GUIDED VASCULAR ACCESS DIAGNOSTIC CEREBRAL ANGIOGRAM MECHANICAL THROMBECTOMY FLAT PANEL HEAD CT LEFT INTERNAL CAROTID ARTERY ANGIOPLASTY AND STENTING WITH CEREBRAL PROTECTION DEVICE COMPARISON:  CT/CT angiogram of head and neck 03/02/2023. MEDICATIONS: Cangrelor IV bolus and drip. ANESTHESIA/SEDATION: The procedure was performed under general anesthesia. CONTRAST:  180 mL of Omnipaque 300 milligram/mL FLUOROSCOPY: Radiation Exposure Index (as provided by the fluoroscopic device): 2415 mGy Kerma COMPLICATIONS: None immediate. TECHNIQUE: Informed written consent was obtained from the patient's wife after a thorough discussion of the procedural risks, benefits and alternatives. All questions were addressed. Maximal Sterile Barrier Technique was utilized including caps, mask, sterile gowns, sterile gloves, sterile drape, hand hygiene and skin antiseptic. A timeout was performed prior to the initiation of the procedure. The right groin was prepped and draped in the usual sterile fashion. Using a micropuncture kit and the modified Seldinger technique, access was gained to the right common femoral artery and an 8 French sheath was placed. Real-time ultrasound guidance was utilized for vascular access including the acquisition of a permanent  ultrasound image documenting patency of the accessed vessel. Under fluoroscopy, an 8 French Paragon balloon guide catheter was navigated over a 6 [place] Berenstein 2 catheter and a 0.035" Terumo Glidewire into the aortic arch. The catheter was placed into the left common carotid artery. The Berenstein catheter was removed. Frontal and lateral angiograms of the neck and skull base were obtained. FINDINGS: 1. Normal caliber of the right common femoral artery, adequate vascular access. 2. 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 Distal reconstitution of flow to the paraclinoid ICA is seen via ophthalmic artery with occlusion of the left M1/MCA just distal to the origin of a prominent anterior temporal artery. PROCEDURE: Using biplane roadmap guidance, crossing of the cervical left ICA occlusion with a freeclimb 70 catheter and microwire proved unsuccessful. Then, the [facility] 2 catheter was again coaxially advanced into the left common carotid artery. Using biplane roadmap guidance, the Berenstein catheter was advanced over the wire into the distal cervical segment of the left ICA. Then, the [facility] guide catheter was connected to a syringe and advanced over the Berenstein catheter into the cervical left ICA. The Berenstein catheter was then removed. Manual aspiration was performed retrieving large amount of clot. The guide catheter was then aspirated until clear back bleed was seen without additional clot. Left internal carotid artery angiograms with frontal and lateral views of the neck and skull base showed recanalization of the cervical left ICA with brisk anterograde flow into the left ACA and left anterior temporal branch with persistent occlusion of the M1 segment. Attempted  navigation of an aspiration catheter through the cervical left ICA proved unsuccessful due to residual stenosis. This resulted in retraction of the guide catheter into the left  common carotid artery. Left common carotid artery angiograms with frontal and lateral views of the neck showed severe residual stenosis in the cervical left ICA at the bulb. Using biplane roadmap guidance, a 4 x 30 mm Viatrac balloon was navigated over an are startle 14 microwire into the left carotid bulb. Angioplasty was performed under fluoroscopy. The guide catheter was then advanced into the mid cervical segment of the left ICA under fluoroscopic guidance. Using biplane roadmap guidance, a FreeClimb 70 aspiration catheter was navigated over a Tenzing delivery catheter into the cavernous segment of the left ICA. The aspiration catheter was then advanced to the level of occlusion in the left M1 segment and connected to an aspiration pump. Continuous aspiration was performed for 2 minutes. The guide catheter was advanced to the petrous segment and the catheter balloon was inflated. The aspiration catheter was subsequently removed under constant aspiration. The guide catheter was aspirated for debris. Left internal carotid artery angiograms with frontal lateral views of the head showed recanalization of the left MCA vascular tree with decrease caliber and slow flow in a distal M3/MCA segment. Delayed angiograms showed occlusion of this M3 branch. Using biplane roadmap guidance, a Socratic 035 aspiration catheter was navigated over an Aristotle 24 microguidewire into the cavernous segment of the left ICA. The aspiration catheter was then advanced to the level of occlusion in the M3 segment and connected to an aspiration pump. Continuous aspiration was performed for 2 minutes. The aspiration catheter was subsequently removed under constant aspiration. The guide catheter was aspirated for debris. Left internal carotid artery angiograms with frontal and lateral views the head showed complete recanalization the left MCA vascular tree with slow flow in distal cortical posterior parietal branches. The guide catheter was  retracted into the left common carotid artery while maintaining wire access to the cervical left ICA. Frontal and lateral angiograms of the neck were obtained showing residual severe stenosis at the left carotid bulb. Then, a 5 x 30 mm Viatrac balloon was navigated over an are store 14 microguidewire into the left carotid bulb. Angioplasty was performed under fluoroscopy. Left common carotid artery angiograms with frontal and lateral views of the neck showed improvement of the degree of stenosis. However, there is significant luminal irregularity suggestive of plaque ulceration with residual moderate to severe stenosis. Flat panel CT of the head was obtained and post processed in a separate workstation with concurrent attending physician supervision. Selected images were sent to PACS. No evidence of hemorrhagic complication. At this point, patient was loaded IV Cangrelor. Left common carotid artery angiogram with frontal and lateral views of the neck showed reocclusion of the left ICA. The guide catheter was connected to an aspiration pump and advanced over the wire into the left carotid bulb under constant aspiration. New clot was retrieved. Left internal carotid artery angiograms with frontal and lateral views of the head showed recanalization of the left ICA with preserved patency of the left MCA vascular tree. Next, a 4-7 mm Emboshield nav 6 cerebral protection device was advanced into the upper cervical segment of the left ICA. The guide catheter was retracted into the left common carotid artery under constant aspiration. Left internal carotid artery angiograms with frontal and lateral views of the head showed residual moderately severe stenosis of the left carotid bulb with very irregular lumen and evidence  of plaque ulceration. The cerebral protection device was deployed. Then, a 9-7 mm x 40 mm Xact carotid stent was deployed across the left carotid bifurcation, covering the area of stenosis. Then, a 6 x 30  mm Viatrac balloon was navigated into the recently deployed stent. Angioplasty was performed under fluoroscopy. The balloon was removed and the cerebral section device was recaptured. Left common carotid artery angiograms with frontal and lateral views of the neck showed adequate positioning of the stent with complete resolution of stenosis and brisk anterograde flow. Left internal carotid artery angiograms with frontal and lateral views of the head showed complete patency of the left MCA vascular with resolution of slows flows in distal branches (TICI 3). Brisk flow to the left ACA is also seen. Delayed left common carotid artery angiograms frontal and lateral views of the recently deployed stent showed brisk anterograde flow without evidence of stent thrombosis or residual stenosis. The catheter was subsequently withdrawn. Right common femoral artery angiogram was obtained in right anterior oblique view. The puncture is at the level of the common femoral artery. The artery has normal caliber, adequate for closure device. The sheath was exchanged over the wire for a Perclose prostyle which was utilized for access closure. Immediate hemostasis was achieved. IMPRESSION: 1. Successful mechanical thrombectomy for treatment of a cervical left ICA and left M1/MCA achieving complete recanalization (TICI 3). 2. Underlying severe atherosclerotic disease with evidence of plaque ulceration in the left carotid bulb with active clot formation treated with stenting and angioplasty with use of cerebral protection device achieving complete resolution of stenosis. 3. No evidence of hemorrhagic complication. PLAN: Transfer to ICU. Transition to [facility] after follow-up CT. Electronically Signed   By: Baldemar Lenis M.D.   On: 03/03/2023 11:36   IR US Guide Vasc Access Right  Result Date: 03/03/2023 INDICATION: 82 year old male presenting to the emergency room with receptive and expressive aphasia; NIHSS 4. His last  known well was 9 p.m. on 03/01/2023. This past medical history significant for hypertension, hyperlipidemia, CAD s/p CABG, history of prostate cancer, with recent admission for sepsis related to COVID-19 infection, discharged on 03/01/23. Premorbid modified Rankin scale 0. Initial head CT showed hyperdense left MCA and chronic ischemic findings. Repeat head CT showed hypodensity within the left basal ganglia. CT angiogram of the head and neck showed an occlusion of the left internal carotid artery at the bifurcation in the neck with reconstitution at the cavernous segment and occlusion of the distal left M1/MCA segment. He was taking emergently to our service for mechanical thrombectomy. EXAM: ULTRASOUND-GUIDED VASCULAR ACCESS DIAGNOSTIC CEREBRAL ANGIOGRAM MECHANICAL THROMBECTOMY FLAT PANEL HEAD CT LEFT INTERNAL CAROTID ARTERY ANGIOPLASTY AND STENTING WITH CEREBRAL PROTECTION DEVICE COMPARISON:  CT/CT angiogram of head and neck 03/02/2023. MEDICATIONS: Cangrelor IV bolus and drip. ANESTHESIA/SEDATION: The procedure was performed under general anesthesia. CONTRAST:  180 mL of Omnipaque 300 milligram/mL FLUOROSCOPY: Radiation Exposure Index (as provided by the fluoroscopic device): 2415 mGy Kerma COMPLICATIONS: None immediate. TECHNIQUE: Informed written consent was obtained from the patient's wife after a thorough discussion of the procedural risks, benefits and alternatives. All questions were addressed. Maximal Sterile Barrier Technique was utilized including caps, mask, sterile gowns, sterile gloves, sterile drape, hand hygiene and skin antiseptic. A timeout was performed prior to the initiation of the procedure. The right groin was prepped and draped in the usual sterile fashion. Using a micropuncture kit and the modified Seldinger technique, access was gained to the right common femoral artery and an 8 [place]  sheath was placed. Real-time ultrasound guidance was utilized for vascular access including the  acquisition of a permanent ultrasound image documenting patency of the accessed vessel. Under fluoroscopy, an 8 French Paragon balloon guide catheter was navigated over a 6 [place] Berenstein 2 catheter and a 0.035" Terumo Glidewire into the aortic arch. The catheter was placed into the left common carotid artery. The Berenstein catheter was removed. Frontal and lateral angiograms of the neck and skull base were obtained. FINDINGS: 1. Normal caliber of the right common femoral artery, adequate vascular access. 2. 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 Distal reconstitution of flow to the paraclinoid ICA is seen via ophthalmic artery with occlusion of the left M1/MCA just distal to the origin of a prominent anterior temporal artery. PROCEDURE: Using biplane roadmap guidance, crossing of the cervical left ICA occlusion with a freeclimb 70 catheter and microwire proved unsuccessful. Then, the [facility] 2 catheter was again coaxially advanced into the left common carotid artery. Using biplane roadmap guidance, the Berenstein catheter was advanced over the wire into the distal cervical segment of the left ICA. Then, the [facility] guide catheter was connected to a syringe and advanced over the Berenstein catheter into the cervical left ICA. The Berenstein catheter was then removed. Manual aspiration was performed retrieving large amount of clot. The guide catheter was then aspirated until clear back bleed was seen without additional clot. Left internal carotid artery angiograms with frontal and lateral views of the neck and skull base showed recanalization of the cervical left ICA with brisk anterograde flow into the left ACA and left anterior temporal branch with persistent occlusion of the M1 segment. Attempted navigation of an aspiration catheter through the cervical left ICA proved unsuccessful due to residual stenosis. This resulted in retraction of the guide  catheter into the left common carotid artery. Left common carotid artery angiograms with frontal and lateral views of the neck showed severe residual stenosis in the cervical left ICA at the bulb. Using biplane roadmap guidance, a 4 x 30 mm Viatrac balloon was navigated over an are startle 14 microwire into the left carotid bulb. Angioplasty was performed under fluoroscopy. The guide catheter was then advanced into the mid cervical segment of the left ICA under fluoroscopic guidance. Using biplane roadmap guidance, a FreeClimb 70 aspiration catheter was navigated over a Tenzing delivery catheter into the cavernous segment of the left ICA. The aspiration catheter was then advanced to the level of occlusion in the left M1 segment and connected to an aspiration pump. Continuous aspiration was performed for 2 minutes. The guide catheter was advanced to the petrous segment and the catheter balloon was inflated. The aspiration catheter was subsequently removed under constant aspiration. The guide catheter was aspirated for debris. Left internal carotid artery angiograms with frontal lateral views of the head showed recanalization of the left MCA vascular tree with decrease caliber and slow flow in a distal M3/MCA segment. Delayed angiograms showed occlusion of this M3 branch. Using biplane roadmap guidance, a Socratic 035 aspiration catheter was navigated over an Aristotle 24 microguidewire into the cavernous segment of the left ICA. The aspiration catheter was then advanced to the level of occlusion in the M3 segment and connected to an aspiration pump. Continuous aspiration was performed for 2 minutes. The aspiration catheter was subsequently removed under constant aspiration. The guide catheter was aspirated for debris. Left internal carotid artery angiograms with frontal and lateral views the head showed complete recanalization  the left MCA vascular tree with slow flow in distal cortical posterior parietal branches.  The guide catheter was retracted into the left common carotid artery while maintaining wire access to the cervical left ICA. Frontal and lateral angiograms of the neck were obtained showing residual severe stenosis at the left carotid bulb. Then, a 5 x 30 mm Viatrac balloon was navigated over an are store 14 microguidewire into the left carotid bulb. Angioplasty was performed under fluoroscopy. Left common carotid artery angiograms with frontal and lateral views of the neck showed improvement of the degree of stenosis. However, there is significant luminal irregularity suggestive of plaque ulceration with residual moderate to severe stenosis. Flat panel CT of the head was obtained and post processed in a separate workstation with concurrent attending physician supervision. Selected images were sent to PACS. No evidence of hemorrhagic complication. At this point, patient was loaded IV Cangrelor. Left common carotid artery angiogram with frontal and lateral views of the neck showed reocclusion of the left ICA. The guide catheter was connected to an aspiration pump and advanced over the wire into the left carotid bulb under constant aspiration. New clot was retrieved. Left internal carotid artery angiograms with frontal and lateral views of the head showed recanalization of the left ICA with preserved patency of the left MCA vascular tree. Next, a 4-7 mm Emboshield nav 6 cerebral protection device was advanced into the upper cervical segment of the left ICA. The guide catheter was retracted into the left common carotid artery under constant aspiration. Left internal carotid artery angiograms with frontal and lateral views of the head showed residual moderately severe stenosis of the left carotid bulb with very irregular lumen and evidence of plaque ulceration. The cerebral protection device was deployed. Then, a 9-7 mm x 40 mm Xact carotid stent was deployed across the left carotid bifurcation, covering the area of  stenosis. Then, a 6 x 30 mm Viatrac balloon was navigated into the recently deployed stent. Angioplasty was performed under fluoroscopy. The balloon was removed and the cerebral section device was recaptured. Left common carotid artery angiograms with frontal and lateral views of the neck showed adequate positioning of the stent with complete resolution of stenosis and brisk anterograde flow. Left internal carotid artery angiograms with frontal and lateral views of the head showed complete patency of the left MCA vascular with resolution of slows flows in distal branches (TICI 3). Brisk flow to the left ACA is also seen. Delayed left common carotid artery angiograms frontal and lateral views of the recently deployed stent showed brisk anterograde flow without evidence of stent thrombosis or residual stenosis. The catheter was subsequently withdrawn. Right common femoral artery angiogram was obtained in right anterior oblique view. The puncture is at the level of the common femoral artery. The artery has normal caliber, adequate for closure device. The sheath was exchanged over the wire for a Perclose prostyle which was utilized for access closure. Immediate hemostasis was achieved. IMPRESSION: 1. Successful mechanical thrombectomy for treatment of a cervical left ICA and left M1/MCA achieving complete recanalization (TICI 3). 2. Underlying severe atherosclerotic disease with evidence of plaque ulceration in the left carotid bulb with active clot formation treated with stenting and angioplasty with use of cerebral protection device achieving complete resolution of stenosis. 3. No evidence of hemorrhagic complication. PLAN: Transfer to ICU. Transition to DAPT after follow-up CT. Electronically Signed   By: Baldemar Lenis M.D.   On: 03/03/2023 11:36   IR CT Head Ltd  Result Date: 03/03/2023 INDICATION: 82 year old male presenting to the emergency room with receptive and expressive aphasia; NIHSS 4.  His last known well was 9 p.m. on 03/01/2023. This past medical history significant for hypertension, hyperlipidemia, CAD s/p CABG, history of prostate cancer, with recent admission for sepsis related to COVID-19 infection, discharged on 03/01/23. Premorbid modified Rankin scale 0. Initial head CT showed hyperdense left MCA and chronic ischemic findings. Repeat head CT showed hypodensity within the left basal ganglia. CT angiogram of the head and neck showed an occlusion of the left internal carotid artery at the bifurcation in the neck with reconstitution at the cavernous segment and occlusion of the distal left M1/MCA segment. He was taking emergently to our service for mechanical thrombectomy. EXAM: ULTRASOUND-GUIDED VASCULAR ACCESS DIAGNOSTIC CEREBRAL ANGIOGRAM MECHANICAL THROMBECTOMY FLAT PANEL HEAD CT LEFT INTERNAL CAROTID ARTERY ANGIOPLASTY AND STENTING WITH CEREBRAL PROTECTION DEVICE COMPARISON:  CT/CT angiogram of head and neck 03/02/2023. MEDICATIONS: Cangrelor IV bolus and drip. ANESTHESIA/SEDATION: The procedure was performed under general anesthesia. CONTRAST:  180 mL of Omnipaque 300 milligram/mL FLUOROSCOPY: Radiation Exposure Index (as provided by the fluoroscopic device): 2415 mGy Kerma COMPLICATIONS: None immediate. TECHNIQUE: Informed written consent was obtained from the patient's wife after a thorough discussion of the procedural risks, benefits and alternatives. All questions were addressed. Maximal Sterile Barrier Technique was utilized including caps, mask, sterile gowns, sterile gloves, sterile drape, hand hygiene and skin antiseptic. A timeout was performed prior to the initiation of the procedure. The right groin was prepped and draped in the usual sterile fashion. Using a micropuncture kit and the modified Seldinger technique, access was gained to the right common femoral artery and an 8 French sheath was placed. Real-time ultrasound guidance was utilized for vascular access including the  acquisition of a permanent ultrasound image documenting patency of the accessed vessel. Under fluoroscopy, an 8 French Paragon balloon guide catheter was navigated over a 6 [place] Berenstein 2 catheter and a 0.035" Terumo Glidewire into the aortic arch. The catheter was placed into the left common carotid artery. The Berenstein catheter was removed. Frontal and lateral angiograms of the neck and skull base were obtained. FINDINGS: 1. Normal caliber of the right common femoral artery, adequate vascular access. 2. 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 Distal reconstitution of flow to the paraclinoid ICA is seen via ophthalmic artery with occlusion of the left M1/MCA just distal to the origin of a prominent anterior temporal artery. PROCEDURE: Using biplane roadmap guidance, crossing of the cervical left ICA occlusion with a freeclimb 70 catheter and microwire proved unsuccessful. Then, the [facility] 2 catheter was again coaxially advanced into the left common carotid artery. Using biplane roadmap guidance, the Berenstein catheter was advanced over the wire into the distal cervical segment of the left ICA. Then, the [facility] guide catheter was connected to a syringe and advanced over the Berenstein catheter into the cervical left ICA. The Berenstein catheter was then removed. Manual aspiration was performed retrieving large amount of clot. The guide catheter was then aspirated until clear back bleed was seen without additional clot. Left internal carotid artery angiograms with frontal and lateral views of the neck and skull base showed recanalization of the cervical left ICA with brisk anterograde flow into the left ACA and left anterior temporal branch with persistent occlusion of the M1 segment. Attempted navigation of an aspiration catheter through the cervical left ICA proved unsuccessful due to residual stenosis. This resulted in retraction of  the guide  catheter into the left common carotid artery. Left common carotid artery angiograms with frontal and lateral views of the neck showed severe residual stenosis in the cervical left ICA at the bulb. Using biplane roadmap guidance, a 4 x 30 mm Viatrac balloon was navigated over an are startle 14 microwire into the left carotid bulb. Angioplasty was performed under fluoroscopy. The guide catheter was then advanced into the mid cervical segment of the left ICA under fluoroscopic guidance. Using biplane roadmap guidance, a FreeClimb 70 aspiration catheter was navigated over a Tenzing delivery catheter into the cavernous segment of the left ICA. The aspiration catheter was then advanced to the level of occlusion in the left M1 segment and connected to an aspiration pump. Continuous aspiration was performed for 2 minutes. The guide catheter was advanced to the petrous segment and the catheter balloon was inflated. The aspiration catheter was subsequently removed under constant aspiration. The guide catheter was aspirated for debris. Left internal carotid artery angiograms with frontal lateral views of the head showed recanalization of the left MCA vascular tree with decrease caliber and slow flow in a distal M3/MCA segment. Delayed angiograms showed occlusion of this M3 branch. Using biplane roadmap guidance, a Socratic 035 aspiration catheter was navigated over an Aristotle 24 microguidewire into the cavernous segment of the left ICA. The aspiration catheter was then advanced to the level of occlusion in the M3 segment and connected to an aspiration pump. Continuous aspiration was performed for 2 minutes. The aspiration catheter was subsequently removed under constant aspiration. The guide catheter was aspirated for debris. Left internal carotid artery angiograms with frontal and lateral views the head showed complete recanalization the left MCA vascular tree with slow flow in distal cortical posterior parietal branches.  The guide catheter was retracted into the left common carotid artery while maintaining wire access to the cervical left ICA. Frontal and lateral angiograms of the neck were obtained showing residual severe stenosis at the left carotid bulb. Then, a 5 x 30 mm Viatrac balloon was navigated over an are store 14 microguidewire into the left carotid bulb. Angioplasty was performed under fluoroscopy. Left common carotid artery angiograms with frontal and lateral views of the neck showed improvement of the degree of stenosis. However, there is significant luminal irregularity suggestive of plaque ulceration with residual moderate to severe stenosis. Flat panel CT of the head was obtained and post processed in a separate workstation with concurrent attending physician supervision. Selected images were sent to PACS. No evidence of hemorrhagic complication. At this point, patient was loaded IV Cangrelor. Left common carotid artery angiogram with frontal and lateral views of the neck showed reocclusion of the left ICA. The guide catheter was connected to an aspiration pump and advanced over the wire into the left carotid bulb under constant aspiration. New clot was retrieved. Left internal carotid artery angiograms with frontal and lateral views of the head showed recanalization of the left ICA with preserved patency of the left MCA vascular tree. Next, a 4-7 mm Emboshield nav 6 cerebral protection device was advanced into the upper cervical segment of the left ICA. The guide catheter was retracted into the left common carotid artery under constant aspiration. Left internal carotid artery angiograms with frontal and lateral views of the head showed residual moderately severe stenosis of the left carotid bulb with very irregular lumen and evidence of plaque ulceration. The cerebral protection device was deployed. Then, a 9-7 mm x 40 mm Xact carotid stent was deployed  across the left carotid bifurcation, covering the area of  stenosis. Then, a 6 x 30 mm Viatrac balloon was navigated into the recently deployed stent. Angioplasty was performed under fluoroscopy. The balloon was removed and the cerebral section device was recaptured. Left common carotid artery angiograms with frontal and lateral views of the neck showed adequate positioning of the stent with complete resolution of stenosis and brisk anterograde flow. Left internal carotid artery angiograms with frontal and lateral views of the head showed complete patency of the left MCA vascular with resolution of slows flows in distal branches (TICI 3). Brisk flow to the left ACA is also seen. Delayed left common carotid artery angiograms frontal and lateral views of the recently deployed stent showed brisk anterograde flow without evidence of stent thrombosis or residual stenosis. The catheter was subsequently withdrawn. Right common femoral artery angiogram was obtained in right anterior oblique view. The puncture is at the level of the common femoral artery. The artery has normal caliber, adequate for closure device. The sheath was exchanged over the wire for a Perclose prostyle which was utilized for access closure. Immediate hemostasis was achieved. IMPRESSION: 1. Successful mechanical thrombectomy for treatment of a cervical left ICA and left M1/MCA achieving complete recanalization (TICI 3). 2. Underlying severe atherosclerotic disease with evidence of plaque ulceration in the left carotid bulb with active clot formation treated with stenting and angioplasty with use of cerebral protection device achieving complete resolution of stenosis. 3. No evidence of hemorrhagic complication. PLAN: Transfer to ICU. Transition to DAPT after follow-up CT. Electronically Signed   By: Baldemar Lenis M.D.   On: 03/03/2023 11:36   IR INTRAVSC STENT CERV CAROTID W/EMB-PROT MOD SED  Result Date: 03/03/2023 INDICATION: 82 year old male presenting to the emergency room with receptive  and expressive aphasia; NIHSS 4. His last known well was 9 p.m. on 03/01/2023. This past medical history significant for hypertension, hyperlipidemia, CAD s/p CABG, history of prostate cancer, with recent admission for sepsis related to COVID-19 infection, discharged on 03/01/23. Premorbid modified Rankin scale 0. Initial head CT showed hyperdense left MCA and chronic ischemic findings. Repeat head CT showed hypodensity within the left basal ganglia. CT angiogram of the head and neck showed an occlusion of the left internal carotid artery at the bifurcation in the neck with reconstitution at the cavernous segment and occlusion of the distal left M1/MCA segment. He was taking emergently to our service for mechanical thrombectomy. EXAM: ULTRASOUND-GUIDED VASCULAR ACCESS DIAGNOSTIC CEREBRAL ANGIOGRAM MECHANICAL THROMBECTOMY FLAT PANEL HEAD CT LEFT INTERNAL CAROTID ARTERY ANGIOPLASTY AND STENTING WITH CEREBRAL PROTECTION DEVICE COMPARISON:  CT/CT angiogram of head and neck 03/02/2023. MEDICATIONS: Cangrelor IV bolus and drip. ANESTHESIA/SEDATION: The procedure was performed under general anesthesia. CONTRAST:  180 mL of Omnipaque 300 milligram/mL FLUOROSCOPY: Radiation Exposure Index (as provided by the fluoroscopic device): 2415 mGy Kerma COMPLICATIONS: None immediate. TECHNIQUE: Informed written consent was obtained from the patient's wife after a thorough discussion of the procedural risks, benefits and alternatives. All questions were addressed. Maximal Sterile Barrier Technique was utilized including caps, mask, sterile gowns, sterile gloves, sterile drape, hand hygiene and skin antiseptic. A timeout was performed prior to the initiation of the procedure. The right groin was prepped and draped in the usual sterile fashion. Using a micropuncture kit and the modified Seldinger technique, access was gained to the right common femoral artery and an 8 French sheath was placed. Real-time ultrasound guidance was utilized  for vascular access including the acquisition of a permanent ultrasound  image documenting patency of the accessed vessel. Under fluoroscopy, an 8 French Paragon balloon guide catheter was navigated over a 6 [place] Berenstein 2 catheter and a 0.035" Terumo Glidewire into the aortic arch. The catheter was placed into the left common carotid artery. The Berenstein catheter was removed. Frontal and lateral angiograms of the neck and skull base were obtained. FINDINGS: 1. Normal caliber of the right common femoral artery, adequate vascular access. 2. 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 Distal reconstitution of flow to the paraclinoid ICA is seen via ophthalmic artery with occlusion of the left M1/MCA just distal to the origin of a prominent anterior temporal artery. PROCEDURE: Using biplane roadmap guidance, crossing of the cervical left ICA occlusion with a freeclimb 70 catheter and microwire proved unsuccessful. Then, the [facility] 2 catheter was again coaxially advanced into the left common carotid artery. Using biplane roadmap guidance, the Berenstein catheter was advanced over the wire into the distal cervical segment of the left ICA. Then, the [facility] guide catheter was connected to a syringe and advanced over the Berenstein catheter into the cervical left ICA. The Berenstein catheter was then removed. Manual aspiration was performed retrieving large amount of clot. The guide catheter was then aspirated until clear back bleed was seen without additional clot. Left internal carotid artery angiograms with frontal and lateral views of the neck and skull base showed recanalization of the cervical left ICA with brisk anterograde flow into the left ACA and left anterior temporal branch with persistent occlusion of the M1 segment. Attempted navigation of an aspiration catheter through the cervical left ICA proved unsuccessful due to residual stenosis. This  resulted in retraction of the guide catheter into the left common carotid artery. Left common carotid artery angiograms with frontal and lateral views of the neck showed severe residual stenosis in the cervical left ICA at the bulb. Using biplane roadmap guidance, a 4 x 30 mm Viatrac balloon was navigated over an are startle 14 microwire into the left carotid bulb. Angioplasty was performed under fluoroscopy. The guide catheter was then advanced into the mid cervical segment of the left ICA under fluoroscopic guidance. Using biplane roadmap guidance, a FreeClimb 70 aspiration catheter was navigated over a Tenzing delivery catheter into the cavernous segment of the left ICA. The aspiration catheter was then advanced to the level of occlusion in the left M1 segment and connected to an aspiration pump. Continuous aspiration was performed for 2 minutes. The guide catheter was advanced to the petrous segment and the catheter balloon was inflated. The aspiration catheter was subsequently removed under constant aspiration. The guide catheter was aspirated for debris. Left internal carotid artery angiograms with frontal lateral views of the head showed recanalization of the left MCA vascular tree with decrease caliber and slow flow in a distal M3/MCA segment. Delayed angiograms showed occlusion of this M3 branch. Using biplane roadmap guidance, a Socratic 035 aspiration catheter was navigated over an Aristotle 24 microguidewire into the cavernous segment of the left ICA. The aspiration catheter was then advanced to the level of occlusion in the M3 segment and connected to an aspiration pump. Continuous aspiration was performed for 2 minutes. The aspiration catheter was subsequently removed under constant aspiration. The guide catheter was aspirated for debris. Left internal carotid artery angiograms with frontal and lateral views the head showed complete recanalization the left MCA vascular tree with slow flow in distal  cortical posterior parietal branches. The guide catheter was  retracted into the left common carotid artery while maintaining wire access to the cervical left ICA. Frontal and lateral angiograms of the neck were obtained showing residual severe stenosis at the left carotid bulb. Then, a 5 x 30 mm Viatrac balloon was navigated over an are store 14 microguidewire into the left carotid bulb. Angioplasty was performed under fluoroscopy. Left common carotid artery angiograms with frontal and lateral views of the neck showed improvement of the degree of stenosis. However, there is significant luminal irregularity suggestive of plaque ulceration with residual moderate to severe stenosis. Flat panel CT of the head was obtained and post processed in a separate workstation with concurrent attending physician supervision. Selected images were sent to PACS. No evidence of hemorrhagic complication. At this point, patient was loaded IV Cangrelor. Left common carotid artery angiogram with frontal and lateral views of the neck showed reocclusion of the left ICA. The guide catheter was connected to an aspiration pump and advanced over the wire into the left carotid bulb under constant aspiration. New clot was retrieved. Left internal carotid artery angiograms with frontal and lateral views of the head showed recanalization of the left ICA with preserved patency of the left MCA vascular tree. Next, a 4-7 mm Emboshield nav 6 cerebral protection device was advanced into the upper cervical segment of the left ICA. The guide catheter was retracted into the left common carotid artery under constant aspiration. Left internal carotid artery angiograms with frontal and lateral views of the head showed residual moderately severe stenosis of the left carotid bulb with very irregular lumen and evidence of plaque ulceration. The cerebral protection device was deployed. Then, a 9-7 mm x 40 mm Xact carotid stent was deployed across the left carotid  bifurcation, covering the area of stenosis. Then, a 6 x 30 mm Viatrac balloon was navigated into the recently deployed stent. Angioplasty was performed under fluoroscopy. The balloon was removed and the cerebral section device was recaptured. Left common carotid artery angiograms with frontal and lateral views of the neck showed adequate positioning of the stent with complete resolution of stenosis and brisk anterograde flow. Left internal carotid artery angiograms with frontal and lateral views of the head showed complete patency of the left MCA vascular with resolution of slows flows in distal branches (TICI 3). Brisk flow to the left ACA is also seen. Delayed left common carotid artery angiograms frontal and lateral views of the recently deployed stent showed brisk anterograde flow without evidence of stent thrombosis or residual stenosis. The catheter was subsequently withdrawn. Right common femoral artery angiogram was obtained in right anterior oblique view. The puncture is at the level of the common femoral artery. The artery has normal caliber, adequate for closure device. The sheath was exchanged over the wire for a Perclose prostyle which was utilized for access closure. Immediate hemostasis was achieved. IMPRESSION: 1. Successful mechanical thrombectomy for treatment of a cervical left ICA and left M1/MCA achieving complete recanalization (TICI 3). 2. Underlying severe atherosclerotic disease with evidence of plaque ulceration in the left carotid bulb with active clot formation treated with stenting and angioplasty with use of cerebral protection device achieving complete resolution of stenosis. 3. No evidence of hemorrhagic complication. PLAN: Transfer to ICU. Transition to [facility] after follow-up CT. Electronically Signed   By: Baldemar Lenis M.D.   On: 03/03/2023 11:36   CT HEAD WO CONTRAST  Result Date: 03/02/2023 CLINICAL DATA:  Stroke, follow up tandem occlusion (ICA-MCA) status post  mechanical thrombectomy, angioplasty  and stenting. Evaluate for possible hemorrhagic complication in the setting of IV cangrelor. EXAM: CT HEAD WITHOUT CONTRAST TECHNIQUE: Contiguous axial images were obtained from the base of the skull through the vertex without intravenous contrast. RADIATION DOSE REDUCTION: This exam was performed according to the departmental dose-optimization program which includes automated exposure control, adjustment of the mA and/or kV according to patient size and/or use of iterative reconstruction technique. COMPARISON:  Head CT 03/02/2023. FINDINGS: Brain: Faint hyperdensity in the left basal ganglia and left frontal operculum are favored to reflect contrast staining in the setting of recent endovascular intervention. No definite acute hemorrhage. 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 No hydrocephalus or extra-axial collection. No mass effect or midline shift. Vascular: No hyperdense vessel or unexpected calcification. Skull: No calvarial fracture or suspicious bone lesion. Skull base is unremarkable. Sinuses/Orbits: No acute finding. Other: None. IMPRESSION: 1. Faint hyperdensity in the left basal ganglia and left frontal operculum are favored to reflect contrast staining in the setting of recent endovascular intervention. No definite acute hemorrhage. 2. 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 Electronically Signed   By: Orvan Falconer M.D.   On: 03/02/2023 22:00   DG Chest Port 1 View  Result Date: 03/02/2023 CLINICAL DATA:  Respiratory failure EXAM: PORTABLE CHEST 1 VIEW COMPARISON:  02/27/2023 FINDINGS: Endotracheal tube seen 2.5 cm above the carina. Nasogastric tube tip seen within the proximal body of the stomach. Lung volumes are extremely small with bibasilar atelectasis present. No pneumothorax or pleural effusion. Coronary  artery bypass grafting has been performed. Cardiac size within normal limits. No acute bone abnormality. IMPRESSION: 1. Support apparatus as described above. 2. Extremely small lung volumes with bibasilar atelectasis. Electronically Signed   By: Helyn Numbers M.D.   On: 03/02/2023 20:50   CT HEAD CODE STROKE WO CONTRAST`  Result Date: 03/02/2023 CLINICAL DATA:  Code stroke. Aphasia, inability to identify objects or follow commands EXAM: CT ANGIOGRAPHY HEAD AND NECK CT PERFUSION BRAIN TECHNIQUE: Multidetector CT imaging of the head and neck was performed using the standard protocol during bolus administration of intravenous contrast. Multiplanar CT image reconstructions and MIPs were obtained to evaluate the vascular anatomy. Carotid stenosis measurements (when applicable) are obtained utilizing NASCET criteria, using the distal internal carotid diameter as the denominator. Multiphase CT imaging of the brain was performed following IV bolus contrast injection. Subsequent parametric perfusion maps were calculated using RAPID software. RADIATION DOSE REDUCTION: This exam was performed according to the departmental dose-optimization program which includes automated exposure control, adjustment of the mA and/or kV according to patient size and/or use of iterative reconstruction technique. CONTRAST:  OMNIPAQUE IOHEXOL 350 MG/ML SOLN COMPARISON:  None Available. FINDINGS: CT HEAD FINDINGS Brain: There is no evidence of acute intracranial hemorrhage. There is suspected early infarct in the left basal ganglia in the MCA distribution (2-24). ASPECTS is 9. There is parenchymal volume loss with prominence of the ventricular system and extra-axial CSF spaces. There is multifocal encephalomalacia in the left frontal lobe in the MCA distribution likely reflecting prior infarcts. Additional hypodensity in the supratentorial white matter likely reflects underlying chronic small-vessel ischemic change The pituitary and  suprasellar region are normal. There is no mass lesion. There is no mass effect or midline shift. Vascular: See below. Skull: Normal. Negative for fracture or focal lesion. Sinuses/Orbits: There is mild mucosal thickening in the paranasal sinuses. The globes and orbits are unremarkable.  Other: The mastoid air cells and middle ear cavities are clear. Review of the MIP images confirms the above findings CTA NECK FINDINGS Aortic arch: The imaged aortic arch is normal. The origins of the major branch vessels are patent. The subclavian arteries are patent to the level imaged. Right carotid system: The right common, internal, and external carotid arteries are patent, with calcified plaque at the bifurcation resulting in less than 50% stenosis. There is no evidence of dissection or aneurysm. Left carotid system: The left common carotid artery is patent. The internal carotid artery is occluded from the bifurcation throughout the remainder of its course in the neck. The external carotid artery is patent. Vertebral arteries: There is moderate stenosis of the origin of the right vertebral artery. There is scattered calcified plaque in the remainder of the vertebral arteries without other significant stenosis or occlusion there is no evidence of dissection or aneurysm. Skeleton: There is no acute osseous abnormality or suspicious osseous lesion. There is no visible canal hematoma. Other neck: The soft tissues of the neck are unremarkable. Upper chest: The imaged lung apices are clear. There is debris in the esophagus. Review of the MIP images confirms the above findings CTA HEAD FINDINGS Anterior circulation: There is reconstitution of flow in the cavernous segment of the left ICA with linear nonocclusive hypodense filling defect likely reflecting clot. The proximal M1 segment is patent, but there is occlusion of the M1 segment just after the origin of the anterior temporal branch. There is reconstitution of flow in the M2  branches of the bifurcation. There is mild calcified plaque in the right intracranial ICA without significant stenosis. The right M1 segment and distal branches are patent, without proximal stenosis or occlusion. The bilateral ACAS are patent, without proximal stenosis or occlusion. The anterior communicating artery is normal. There is no aneurysm or AVM. Posterior circulation: The bilateral V4 segments are patent. The basilar artery is patent. The left PICA origin is not definitely seen. The other major cerebellar arteries appear patent. The bilateral PCAs are patent, without proximal stenosis or occlusion. Small bilateral posterior communicating arteries are identified. There is no aneurysm or AVM. Venous sinuses: Patent. Anatomic variants: None. Review of the MIP images confirms the above findings CT Brain Perfusion Findings: ASPECTS: 10 CBF (<30%) Volume: 13mL Perfusion (Tmax>6.0s) volume: 70mL Mismatch Volume: 57mL Infarction Location:Left MCA distribution. IMPRESSION: 1. Occluded left internal carotid artery from the bifurcation throughout the remainder of its course in the neck. There is reconstitution of flow in the cavernous segment with nonocclusive hypodense filling defect likely reflecting clot. 2. Occluded left M1 segment just after the anterior temporal branch with reconstitution of flow in the M2 segments of the bifurcation. 3. Possible early infarct in the left basal ganglia.  ASPECTS is 9. 4. 13 cc infarct core and 57 cc mismatch in the MCA distribution. 5. Moderate right vertebral artery origin stenosis and mild plaque at the right carotid bifurcation. Findings discussed with Dr Selina Cooley at 2:29 pm. Electronically Signed   By: Lesia Hausen M.D.   On: 03/02/2023 14:40   CT ANGIO HEAD NECK W WO CM W PERF (CODE STROKE)  Result Date: 03/02/2023 CLINICAL DATA:  Code stroke. Aphasia, inability to identify objects or follow commands EXAM: CT ANGIOGRAPHY HEAD AND NECK CT PERFUSION BRAIN TECHNIQUE:  Multidetector CT imaging of the head and neck was performed using the standard protocol during bolus administration of intravenous contrast. Multiplanar CT image reconstructions and MIPs were obtained to evaluate the vascular anatomy.  Carotid stenosis measurements (when applicable) are obtained utilizing NASCET criteria, using the distal internal carotid diameter as the denominator. Multiphase CT imaging of the brain was performed following IV bolus contrast injection. Subsequent parametric perfusion maps were calculated using RAPID software. RADIATION DOSE REDUCTION: This exam was performed according to the departmental dose-optimization program which includes automated exposure control, adjustment of the mA and/or kV according to patient size and/or use of iterative reconstruction technique. CONTRAST:  OMNIPAQUE IOHEXOL 350 MG/ML SOLN COMPARISON:  None Available. FINDINGS: CT HEAD FINDINGS Brain: There is no evidence of acute intracranial hemorrhage. There is suspected early infarct in the left basal ganglia in the MCA distribution (2-24). ASPECTS is 9. There is parenchymal volume loss with prominence of the ventricular system and extra-axial CSF spaces. There is multifocal encephalomalacia in the left frontal lobe in the MCA distribution likely reflecting prior infarcts. Additional hypodensity in the supratentorial white matter likely reflects underlying chronic small-vessel ischemic change The pituitary and suprasellar region are normal. There is no mass lesion. There is no mass effect or midline shift. Vascular: See below. Skull: Normal. Negative for fracture or focal lesion. Sinuses/Orbits: There is mild mucosal thickening in the paranasal sinuses. The globes and orbits are unremarkable. Other: The mastoid air cells and middle ear cavities are clear. Review of the MIP images confirms the above findings CTA NECK FINDINGS Aortic arch: The imaged aortic arch is normal. The origins of the major branch  vessels are patent. The subclavian arteries are patent to the level imaged. Right carotid system: The right common, internal, and external carotid arteries are patent, with calcified plaque at the bifurcation resulting in less than 50% stenosis. There is no evidence of dissection or aneurysm. Left carotid system: The left common carotid artery is patent. The internal carotid artery is occluded from the bifurcation throughout the remainder of its course in the neck. The external carotid artery is patent. Vertebral arteries: There is moderate stenosis of the origin of the right vertebral artery. There is scattered calcified plaque in the remainder of the vertebral arteries without other significant stenosis or occlusion there is no evidence of dissection or aneurysm. Skeleton: There is no acute osseous abnormality or suspicious osseous lesion. There is no visible canal hematoma. Other neck: The soft tissues of the neck are unremarkable. Upper chest: The imaged lung apices are clear. There is debris in the esophagus. Review of the MIP images confirms the above findings CTA HEAD FINDINGS Anterior circulation: There is reconstitution of flow in the cavernous segment of the left ICA with linear nonocclusive hypodense filling defect likely reflecting clot. The proximal M1 segment is patent, but there is occlusion of the M1 segment just after the origin of the anterior temporal branch. There is reconstitution of flow in the M2 branches of the bifurcation. There is mild calcified plaque in the right intracranial ICA without significant stenosis. The right M1 segment and distal branches are patent, without proximal stenosis or occlusion. The bilateral ACAS are patent, without proximal stenosis or occlusion. The anterior communicating artery is normal. There is no aneurysm or AVM. Posterior circulation: The bilateral V4 segments are patent. The basilar artery is patent. The left PICA origin is not definitely seen. The other  major cerebellar arteries appear patent. The bilateral PCAs are patent, without proximal stenosis or occlusion. Small bilateral posterior communicating arteries are identified. There is no aneurysm or AVM. Venous sinuses: Patent. Anatomic variants: None. Review of the MIP images confirms the above findings CT Brain Perfusion Findings:  ASPECTS: 10 CBF (<30%) Volume: 13mL Perfusion (Tmax>6.0s) volume: 70mL Mismatch Volume: 57mL Infarction Location:Left MCA distribution. IMPRESSION: 1. Occluded left internal carotid artery from the bifurcation throughout the remainder of its course in the neck. There is reconstitution of flow in the cavernous segment with nonocclusive hypodense filling defect likely reflecting clot. 2. Occluded left M1 segment just after the anterior temporal branch with reconstitution of flow in the M2 segments of the bifurcation. 3. Possible early infarct in the left basal ganglia.  ASPECTS is 9. 4. 13 cc infarct core and 57 cc mismatch in the MCA distribution. 5. Moderate right vertebral artery origin stenosis and mild plaque at the right carotid bifurcation. Findings discussed with Dr Selina Cooley at 2:29 pm. Electronically Signed   By: Lesia Hausen M.D.   On: 03/02/2023 14:40   CT HEAD WO CONTRAST  Result Date: 03/02/2023 CLINICAL DATA:  Neuro deficit, acute, stroke suspected EXAM: CT HEAD WITHOUT CONTRAST TECHNIQUE: Contiguous axial images were obtained from the base of the skull through the vertex without intravenous contrast. RADIATION DOSE REDUCTION: This exam was performed according to the departmental dose-optimization program which includes automated exposure control, adjustment of the mA and/or kV according to patient size and/or use of iterative reconstruction technique. COMPARISON:  None Available. FINDINGS: Brain: Age indeterminate, potentially subacute infarct in the inferior left frontal lobe. No evidence of acute hemorrhage, hydrocephalus, extra-axial collection or mass lesion/mass  effect. Additional patchy white matter hypodensities are nonspecific but compatible with chronic microvascular ischemic disease. Cerebral atrophy. Vascular: Question hyperdense appearance of the distal left M1 MCA. Skull: No acute fracture. Sinuses/Orbits: Clear sinuses. No acute orbital findings. Fluid-filled no mastoid effusions. Other: No mastoid effusions. IMPRESSION: 1. Age indeterminate, potentially acute or subacute infarct in the inferior left frontal lobe. Recommend MRI to further evaluate. 2. Question hyperdense appearance of the distal left M1 MCA, which could represent artifact or thrombus. Recommend CTA to exclude LVO. Findings discussed with Melene Plan via telephone at 1:45 PM. Electronically Signed   By: Feliberto Harts M.D.   On: 03/02/2023 13:46   CT Angio Chest Pulmonary Embolism (PE) W or WO Contrast  Result Date: 02/28/2023 CLINICAL DATA:  Pulmonary embolism (PE) suspected, low to intermediate prob, positive D-dimer Pulmonary embolism (PE) suspected, high prob. EXAM: CT ANGIOGRAPHY CHEST WITH CONTRAST TECHNIQUE: Multidetector CT imaging of the chest was performed using the standard protocol during bolus administration of intravenous contrast. Multiplanar CT image reconstructions and MIPs were obtained to evaluate the vascular anatomy. RADIATION DOSE REDUCTION: This exam was performed according to the departmental dose-optimization program which includes automated exposure control, adjustment of the mA and/or kV according to patient size and/or use of iterative reconstruction technique. CONTRAST:  75mL OMNIPAQUE IOHEXOL 350 MG/ML SOLN COMPARISON:  Chest radiograph 02/27/2023. FINDINGS: Cardiovascular: Satisfactory opacification of the pulmonary arteries to the segmental level. No evidence of pulmonary embolism. Postoperative changes of median sternotomy and CABG. Mediastinum/Nodes: No enlarged mediastinal, hilar, or axillary lymph nodes. Thyroid gland, trachea, and esophagus demonstrate no  significant findings. Lungs/Pleura: Elevated right hemidiaphragm with adjacent atelectasis in the right lung base. Trace bilateral pleural effusions. No consolidation or pulmonary edema. Upper Abdomen: No acute abnormality. Musculoskeletal: No chest wall abnormality. No acute or significant osseous findings. Review of the MIP images confirms the above findings. IMPRESSION: 1. No evidence of pulmonary embolism. 2. Elevated right hemidiaphragm with adjacent atelectasis in the right lung base. 3. Trace bilateral pleural effusions. Electronically Signed   By: Orvan Falconer M.D.   On: 02/28/2023 11:53  Labs:  CBC: Recent Labs    02/27/23 1409 02/28/23 0633 03/02/23 1041 03/02/23 1047 03/02/23 1952 03/03/23 0721  WBC 8.8 6.9 9.9  --   --  10.4  HGB 14.2 14.9 13.9 14.6 12.2* 12.7*  HCT 45.1 45.9 43.0 43.0 36.0* 38.3*  PLT 159 143* 249  --   --  306    COAGS: Recent Labs    02/27/23 1409 02/27/23 1411 02/28/23 0633 03/02/23 1215  INR 1.1  --  1.0 1.1  APTT  --  33 23* 26    BMP: Recent Labs    02/28/23 0633 03/01/23 0811 03/02/23 1047 03/02/23 1215 03/02/23 1952 03/03/23 0721  NA 135 136 136 137 139 138  K 3.8 4.6 5.6* 3.9 3.9 3.8  CL 98 100 101 104  --  109  CO2 26 26  --  27  --  22  GLUCOSE 152* 187* 117* 132*  --  166*  BUN 23 36* 57* 34*  --  28*  CALCIUM 8.4* 8.8*  --  8.3*  --  7.9*  CREATININE 0.95 1.00 0.90 0.95  --  0.91  GFRNONAA >60 >60  --  >60  --  >60    LIVER FUNCTION TESTS: Recent Labs    05/02/22 0815 02/27/23 1409 03/02/23 1215  BILITOT 0.7 1.1 0.5  AST 20 26 27   ALT 26 31 32  ALKPHOS 84 86 63  PROT 6.8 7.1 5.3*  ALBUMIN 4.4 3.3* 2.6*    Assessment and Plan: Left ICA and left MCA occlusion Patient s/p mechanical thrombectomy with delayed carotid reocclusion requiring ultimate angioplasty and stenting of the cervical ICA with complete intra-cranial revascularization. Patient extubated this afternoon after MRI.  Overall doing well.   Conversant, moving all extremities, following commands.  Groin site intact.  Did get Brilinta loading dose of 180mg  overnight, now to resume 90mg  BID with aspirin 81mg  daily.   Further care per Neuro team.   Electronically Signed: Hoyt Koch, PA 03/03/2023, 4:54 PM   I spent a total of 15 Minutes at the the patient's bedside AND on the patient's hospital floor or unit, greater than 50% of which was counseling/coordinating care for L ICA, MCA occlusion.

## 2023-03-03 NOTE — Procedures (Signed)
Extubation Procedure Note  Patient Details:   Name: Robert [name] DOB: 1941/01/12 MRN: [id number]   Airway Documentation:    Vent end date: 03/03/23 Vent end time: 1307   Evaluation  O2 sats: stable throughout Complications: No apparent complications Patient did tolerate procedure well. Bilateral Breath Sounds: Diminished   Yes Pt extubated to 2L [place]. Positive cuff leak noted  [name] V 03/03/2023, 1:08 PM

## 2023-03-03 NOTE — Progress Notes (Signed)
Echocardiogram attempted, patient went to MRI

## 2023-03-03 NOTE — Progress Notes (Addendum)
STROKE TEAM PROGRESS NOTE   BRIEF HPI Robert [name] is a 82 y.o. male with history of hypertension, CAD status post CABG x 4, history of prostate cancer, with recent admission for sepsis secondary to COVID-19 infection who presented to the [facility] ED with global aphasia, found to have occluded left ICA clot with blocked left M1 segment and left basal ganglia infarct.   SIGNIFICANT HOSPITAL EVENTS 9/26: Code stroke called.  Perfusion with 13 cc infarction 57 cc mismatch.  IR activated.  Status post mechanical thrombectomy of left MCA and stenting of left ICA.  Required intubation. 9/27: Patient now extubated, conversant, moving all limbs, talking.  Loaded on Brilinta  INTERIM HISTORY/SUBJECTIVE  Patient intubated but alert and following commands.  Around 8 PM yesterday evening he became globally aphasic.  Follow-up MRI today showed basal ganglia infarct but no f hemorrhage had extensive open heart surgery in 2006.  Per family, had A-fib in the hospital at that time.  Could have recurred.  Will likely need Holter monitor before discharge.  We will start on Brilinta/aspirin in setting of stenting.  OBJECTIVE  CBC    Component Value Date/Time   WBC 10.4 03/03/2023 0721   RBC 3.92 (L) 03/03/2023 0721   HGB 12.7 (L) 03/03/2023 0721   HCT 38.3 (L) 03/03/2023 0721   PLT 306 03/03/2023 0721   MCV 97.7 03/03/2023 0721   MCH 32.4 03/03/2023 0721   MCHC 33.2 03/03/2023 0721   RDW 13.4 03/03/2023 0721   LYMPHSABS 1.1 03/02/2023 1041   MONOABS 0.8 03/02/2023 1041   EOSABS 0.0 03/02/2023 1041   BASOSABS 0.0 03/02/2023 1041    BMET    Component Value Date/Time   NA 138 03/03/2023 0721   K 3.8 03/03/2023 0721   CL 109 03/03/2023 0721   CO2 22 03/03/2023 0721   GLUCOSE 166 (H) 03/03/2023 0721   BUN 28 (H) 03/03/2023 0721   CREATININE 0.91 03/03/2023 0721   CREATININE 0.91 04/06/2020 0851   CALCIUM 7.9 (L) 03/03/2023 0721   GFRNONAA >60 03/03/2023 0721   GFRNONAA 81 04/06/2020  0851    IMAGING past 24 hours MR BRAIN WO CONTRAST  Result Date: 03/03/2023 CLINICAL DATA:  Provided history: Neuro deficit, acute, stroke suspected. EXAM: MRI HEAD WITHOUT CONTRAST TECHNIQUE: Multiplanar, multiecho pulse sequences of the brain and surrounding structures were obtained without intravenous contrast. COMPARISON:  Prior head CT examinations 03/02/2023. CT angiogram head/neck 03/02/2023. Interventional radiology report from 03/02/2023. FINDINGS: Brain: Mild generalized parenchymal atrophy. 6 mm acute infarct at the junction of the left external capsule and putamen posteriorly (for instance as seen on series 2, image 23). Small to moderate-sized chronic cortical/subcortical infarct within the anterior left frontal lobe and anterior left insula. Small chronic cortically-based infarcts within the left frontoparietal operculum and more posteriorly within the left parietal lobe. Background moderate multifocal T2 FLAIR hyperintense signal abnormality within the cerebral white matter, nonspecific but compatible with chronic small vessel ischemic disease. Chronic lacunar infarct within the left lentiform nucleus. No evidence of an intracranial mass. No chronic intracranial blood products. No extra-axial fluid collection. No midline shift. Vascular: Maintained flow voids within the proximal large arterial vessels. Skull and upper cervical spine: No focal suspicious marrow lesion. Sinuses/Orbits: No mass or acute finding within the imaged orbits. Mild paranasal sinus mucosal thickening. Possible small fluid levels within the sphenoid sinuses. Impression #1 will be called to the ordering clinician or representative by the Radiologist Assistant, and communication documented in the PACS or [facility]  Dashboard. IMPRESSION: 1. 6 mm acute infarct at the junction of the left external capsule and left putamen. 2. Chronic cortically-based left MCA territory infarcts as described. 3. Background moderate cerebral white  matter chronic small vessel ischemic disease. 4. Chronic lacunar infarct within the left lentiform nucleus. 5. Mild generalized cerebral atrophy. 6. Paranasal sinus disease as described. Electronically Signed   By: Jackey Loge D.O.   On: 03/03/2023 12:15   IR PERCUTANEOUS ART THROMBECTOMY/INFUSION INTRACRANIAL INC DIAG ANGIO  Result Date: 03/03/2023 INDICATION: 82 year old male presenting to the emergency room with receptive and expressive aphasia; NIHSS 4. His last known well was 9 p.m. on 03/01/2023. This past medical history significant for hypertension, hyperlipidemia, CAD s/p CABG, history of prostate cancer, with recent admission for sepsis related to COVID-19 infection, discharged on 03/01/23. Premorbid modified Rankin scale 0. Initial head CT showed hyperdense left MCA and chronic ischemic findings. Repeat head CT showed hypodensity within the left basal ganglia. CT angiogram of the head and neck showed an occlusion of the left internal carotid artery at the bifurcation in the neck with reconstitution at the cavernous segment and occlusion of the distal left M1/MCA segment. He was taking emergently to our service for mechanical thrombectomy. EXAM: ULTRASOUND-GUIDED VASCULAR ACCESS DIAGNOSTIC CEREBRAL ANGIOGRAM MECHANICAL THROMBECTOMY FLAT PANEL HEAD CT LEFT INTERNAL CAROTID ARTERY ANGIOPLASTY AND STENTING WITH CEREBRAL PROTECTION DEVICE COMPARISON:  CT/CT angiogram of head and neck 03/02/2023. MEDICATIONS: Cangrelor IV bolus and drip. ANESTHESIA/SEDATION: The procedure was performed under general anesthesia. CONTRAST:  180 mL of Omnipaque 300 milligram/mL FLUOROSCOPY: Radiation Exposure Index (as provided by the fluoroscopic device): 2415 mGy Kerma COMPLICATIONS: None immediate. TECHNIQUE: Informed written consent was obtained from the patient's wife after a thorough discussion of the procedural risks, benefits and alternatives. All questions were addressed. Maximal Sterile Barrier Technique was utilized  including caps, mask, sterile gowns, sterile gloves, sterile drape, hand hygiene and skin antiseptic. A timeout was performed prior to the initiation of the procedure. The right groin was prepped and draped in the usual sterile fashion. Using a micropuncture kit and the modified Seldinger technique, access was gained to the right common femoral artery and an 8 French sheath was placed. Real-time ultrasound guidance was utilized for vascular access including the acquisition of a permanent ultrasound image documenting patency of the accessed vessel. Under fluoroscopy, an 8 French Paragon balloon guide catheter was navigated over a 6 [place] Berenstein 2 catheter and a 0.035" Terumo Glidewire into the aortic arch. The catheter was placed into the left common carotid artery. The Berenstein catheter was removed. Frontal and lateral angiograms of the neck and skull base were obtained. FINDINGS: 1. Normal caliber of the right common femoral artery, adequate vascular access. 2. 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 Distal reconstitution of flow to the paraclinoid ICA is seen via ophthalmic artery with occlusion of the left M1/MCA just distal to the origin of a prominent anterior temporal artery. PROCEDURE: Using biplane roadmap guidance, crossing of the cervical left ICA occlusion with a freeclimb 70 catheter and microwire proved unsuccessful. Then, the [facility] 2 catheter was again coaxially advanced into the left common carotid artery. Using biplane roadmap guidance, the Berenstein catheter was advanced over the wire into the distal cervical segment of the left ICA. Then, the [facility] guide catheter was connected to a syringe and advanced over the Berenstein catheter into the cervical left ICA. The Berenstein catheter was then removed. Manual aspiration was performed retrieving large  amount of clot. The guide catheter was then aspirated until clear back bleed  was seen without additional clot. Left internal carotid artery angiograms with frontal and lateral views of the neck and skull base showed recanalization of the cervical left ICA with brisk anterograde flow into the left ACA and left anterior temporal branch with persistent occlusion of the M1 segment. Attempted navigation of an aspiration catheter through the cervical left ICA proved unsuccessful due to residual stenosis. This resulted in retraction of the guide catheter into the left common carotid artery. Left common carotid artery angiograms with frontal and lateral views of the neck showed severe residual stenosis in the cervical left ICA at the bulb. Using biplane roadmap guidance, a 4 x 30 mm Viatrac balloon was navigated over an are startle 14 microwire into the left carotid bulb. Angioplasty was performed under fluoroscopy. The guide catheter was then advanced into the mid cervical segment of the left ICA under fluoroscopic guidance. Using biplane roadmap guidance, a FreeClimb 70 aspiration catheter was navigated over a Tenzing delivery catheter into the cavernous segment of the left ICA. The aspiration catheter was then advanced to the level of occlusion in the left M1 segment and connected to an aspiration pump. Continuous aspiration was performed for 2 minutes. The guide catheter was advanced to the petrous segment and the catheter balloon was inflated. The aspiration catheter was subsequently removed under constant aspiration. The guide catheter was aspirated for debris. Left internal carotid artery angiograms with frontal lateral views of the head showed recanalization of the left MCA vascular tree with decrease caliber and slow flow in a distal M3/MCA segment. Delayed angiograms showed occlusion of this M3 branch. Using biplane roadmap guidance, a Socratic 035 aspiration catheter was navigated over an Aristotle 24 microguidewire into the cavernous segment of the left ICA. The aspiration catheter was  then advanced to the level of occlusion in the M3 segment and connected to an aspiration pump. Continuous aspiration was performed for 2 minutes. The aspiration catheter was subsequently removed under constant aspiration. The guide catheter was aspirated for debris. Left internal carotid artery angiograms with frontal and lateral views the head showed complete recanalization the left MCA vascular tree with slow flow in distal cortical posterior parietal branches. The guide catheter was retracted into the left common carotid artery while maintaining wire access to the cervical left ICA. Frontal and lateral angiograms of the neck were obtained showing residual severe stenosis at the left carotid bulb. Then, a 5 x 30 mm Viatrac balloon was navigated over an are store 14 microguidewire into the left carotid bulb. Angioplasty was performed under fluoroscopy. Left common carotid artery angiograms with frontal and lateral views of the neck showed improvement of the degree of stenosis. However, there is significant luminal irregularity suggestive of plaque ulceration with residual moderate to severe stenosis. Flat panel CT of the head was obtained and post processed in a separate workstation with concurrent attending physician supervision. Selected images were sent to PACS. No evidence of hemorrhagic complication. At this point, patient was loaded IV Cangrelor. Left common carotid artery angiogram with frontal and lateral views of the neck showed reocclusion of the left ICA. The guide catheter was connected to an aspiration pump and advanced over the wire into the left carotid bulb under constant aspiration. New clot was retrieved. Left internal carotid artery angiograms with frontal and lateral views of the head showed recanalization of the left ICA with preserved patency of the left MCA vascular tree. Next, a  4-7 mm Emboshield nav 6 cerebral protection device was advanced into the upper cervical segment of the left ICA.  The guide catheter was retracted into the left common carotid artery under constant aspiration. Left internal carotid artery angiograms with frontal and lateral views of the head showed residual moderately severe stenosis of the left carotid bulb with very irregular lumen and evidence of plaque ulceration. The cerebral protection device was deployed. Then, a 9-7 mm x 40 mm Xact carotid stent was deployed across the left carotid bifurcation, covering the area of stenosis. Then, a 6 x 30 mm Viatrac balloon was navigated into the recently deployed stent. Angioplasty was performed under fluoroscopy. The balloon was removed and the cerebral section device was recaptured. Left common carotid artery angiograms with frontal and lateral views of the neck showed adequate positioning of the stent with complete resolution of stenosis and brisk anterograde flow. Left internal carotid artery angiograms with frontal and lateral views of the head showed complete patency of the left MCA vascular with resolution of slows flows in distal branches (TICI 3). Brisk flow to the left ACA is also seen. Delayed left common carotid artery angiograms frontal and lateral views of the recently deployed stent showed brisk anterograde flow without evidence of stent thrombosis or residual stenosis. The catheter was subsequently withdrawn. Right common femoral artery angiogram was obtained in right anterior oblique view. The puncture is at the level of the common femoral artery. The artery has normal caliber, adequate for closure device. The sheath was exchanged over the wire for a Perclose prostyle which was utilized for access closure. Immediate hemostasis was achieved. IMPRESSION: 1. Successful mechanical thrombectomy for treatment of a cervical left ICA and left M1/MCA achieving complete recanalization (TICI 3). 2. Underlying severe atherosclerotic disease with evidence of plaque ulceration in the left carotid bulb with active clot formation  treated with stenting and angioplasty with use of cerebral protection device achieving complete resolution of stenosis. 3. No evidence of hemorrhagic complication. PLAN: Transfer to ICU. Transition to DAPT after follow-up CT. Electronically Signed   By: Baldemar Lenis M.D.   On: 03/03/2023 11:36   IR US Guide Vasc Access Right  Result Date: 03/03/2023 INDICATION: 82 year old male presenting to the emergency room with receptive and expressive aphasia; NIHSS 4. His last known well was 9 p.m. on 03/01/2023. This past medical history significant for hypertension, hyperlipidemia, CAD s/p CABG, history of prostate cancer, with recent admission for sepsis related to COVID-19 infection, discharged on 03/01/23. Premorbid modified Rankin scale 0. Initial head CT showed hyperdense left MCA and chronic ischemic findings. Repeat head CT showed hypodensity within the left basal ganglia. CT angiogram of the head and neck showed an occlusion of the left internal carotid artery at the bifurcation in the neck with reconstitution at the cavernous segment and occlusion of the distal left M1/MCA segment. He was taking emergently to our service for mechanical thrombectomy. EXAM: ULTRASOUND-GUIDED VASCULAR ACCESS DIAGNOSTIC CEREBRAL ANGIOGRAM MECHANICAL THROMBECTOMY FLAT PANEL HEAD CT LEFT INTERNAL CAROTID ARTERY ANGIOPLASTY AND STENTING WITH CEREBRAL PROTECTION DEVICE COMPARISON:  CT/CT angiogram of head and neck 03/02/2023. MEDICATIONS: Cangrelor IV bolus and drip. ANESTHESIA/SEDATION: The procedure was performed under general anesthesia. CONTRAST:  180 mL of Omnipaque 300 milligram/mL FLUOROSCOPY: Radiation Exposure Index (as provided by the fluoroscopic device): 2415 mGy Kerma COMPLICATIONS: None immediate. TECHNIQUE: Informed written consent was obtained from the patient's wife after a thorough discussion of the procedural risks, benefits and alternatives. All questions were addressed. Maximal Sterile Barrier  Technique was utilized including caps, mask, sterile gowns, sterile gloves, sterile drape, hand hygiene and skin antiseptic. A timeout was performed prior to the initiation of the procedure. The right groin was prepped and draped in the usual sterile fashion. Using a micropuncture kit and the modified Seldinger technique, access was gained to the right common femoral artery and an 8 French sheath was placed. Real-time ultrasound guidance was utilized for vascular access including the acquisition of a permanent ultrasound image documenting patency of the accessed vessel. Under fluoroscopy, an 8 French Paragon balloon guide catheter was navigated over a 6 [place] Berenstein 2 catheter and a 0.035" Terumo Glidewire into the aortic arch. The catheter was placed into the left common carotid artery. The Berenstein catheter was removed. Frontal and lateral angiograms of the neck and skull base were obtained. FINDINGS: 1. Normal caliber of the right common femoral artery, adequate vascular access. 2. 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 Distal reconstitution of flow to the paraclinoid ICA is seen via ophthalmic artery with occlusion of the left M1/MCA just distal to the origin of a prominent anterior temporal artery. PROCEDURE: Using biplane roadmap guidance, crossing of the cervical left ICA occlusion with a freeclimb 70 catheter and microwire proved unsuccessful. Then, the [facility] 2 catheter was again coaxially advanced into the left common carotid artery. Using biplane roadmap guidance, the Berenstein catheter was advanced over the wire into the distal cervical segment of the left ICA. Then, the [facility] guide catheter was connected to a syringe and advanced over the Berenstein catheter into the cervical left ICA. The Berenstein catheter was then removed. Manual aspiration was performed retrieving large amount of clot. The guide catheter was then aspirated  until clear back bleed was seen without additional clot. Left internal carotid artery angiograms with frontal and lateral views of the neck and skull base showed recanalization of the cervical left ICA with brisk anterograde flow into the left ACA and left anterior temporal branch with persistent occlusion of the M1 segment. Attempted navigation of an aspiration catheter through the cervical left ICA proved unsuccessful due to residual stenosis. This resulted in retraction of the guide catheter into the left common carotid artery. Left common carotid artery angiograms with frontal and lateral views of the neck showed severe residual stenosis in the cervical left ICA at the bulb. Using biplane roadmap guidance, a 4 x 30 mm Viatrac balloon was navigated over an are startle 14 microwire into the left carotid bulb. Angioplasty was performed under fluoroscopy. The guide catheter was then advanced into the mid cervical segment of the left ICA under fluoroscopic guidance. Using biplane roadmap guidance, a FreeClimb 70 aspiration catheter was navigated over a Tenzing delivery catheter into the cavernous segment of the left ICA. The aspiration catheter was then advanced to the level of occlusion in the left M1 segment and connected to an aspiration pump. Continuous aspiration was performed for 2 minutes. The guide catheter was advanced to the petrous segment and the catheter balloon was inflated. The aspiration catheter was subsequently removed under constant aspiration. The guide catheter was aspirated for debris. Left internal carotid artery angiograms with frontal lateral views of the head showed recanalization of the left MCA vascular tree with decrease caliber and slow flow in a distal M3/MCA segment. Delayed angiograms showed occlusion of this M3 branch. Using biplane roadmap guidance, a Socratic 035 aspiration catheter was navigated over an Aristotle 24 microguidewire into the cavernous segment of the  left ICA. The  aspiration catheter was then advanced to the level of occlusion in the M3 segment and connected to an aspiration pump. Continuous aspiration was performed for 2 minutes. The aspiration catheter was subsequently removed under constant aspiration. The guide catheter was aspirated for debris. Left internal carotid artery angiograms with frontal and lateral views the head showed complete recanalization the left MCA vascular tree with slow flow in distal cortical posterior parietal branches. The guide catheter was retracted into the left common carotid artery while maintaining wire access to the cervical left ICA. Frontal and lateral angiograms of the neck were obtained showing residual severe stenosis at the left carotid bulb. Then, a 5 x 30 mm Viatrac balloon was navigated over an are store 14 microguidewire into the left carotid bulb. Angioplasty was performed under fluoroscopy. Left common carotid artery angiograms with frontal and lateral views of the neck showed improvement of the degree of stenosis. However, there is significant luminal irregularity suggestive of plaque ulceration with residual moderate to severe stenosis. Flat panel CT of the head was obtained and post processed in a separate workstation with concurrent attending physician supervision. Selected images were sent to PACS. No evidence of hemorrhagic complication. At this point, patient was loaded IV Cangrelor. Left common carotid artery angiogram with frontal and lateral views of the neck showed reocclusion of the left ICA. The guide catheter was connected to an aspiration pump and advanced over the wire into the left carotid bulb under constant aspiration. New clot was retrieved. Left internal carotid artery angiograms with frontal and lateral views of the head showed recanalization of the left ICA with preserved patency of the left MCA vascular tree. Next, a 4-7 mm Emboshield nav 6 cerebral protection device was advanced into the upper cervical  segment of the left ICA. The guide catheter was retracted into the left common carotid artery under constant aspiration. Left internal carotid artery angiograms with frontal and lateral views of the head showed residual moderately severe stenosis of the left carotid bulb with very irregular lumen and evidence of plaque ulceration. The cerebral protection device was deployed. Then, a 9-7 mm x 40 mm Xact carotid stent was deployed across the left carotid bifurcation, covering the area of stenosis. Then, a 6 x 30 mm Viatrac balloon was navigated into the recently deployed stent. Angioplasty was performed under fluoroscopy. The balloon was removed and the cerebral section device was recaptured. Left common carotid artery angiograms with frontal and lateral views of the neck showed adequate positioning of the stent with complete resolution of stenosis and brisk anterograde flow. Left internal carotid artery angiograms with frontal and lateral views of the head showed complete patency of the left MCA vascular with resolution of slows flows in distal branches (TICI 3). Brisk flow to the left ACA is also seen. Delayed left common carotid artery angiograms frontal and lateral views of the recently deployed stent showed brisk anterograde flow without evidence of stent thrombosis or residual stenosis. The catheter was subsequently withdrawn. Right common femoral artery angiogram was obtained in right anterior oblique view. The puncture is at the level of the common femoral artery. The artery has normal caliber, adequate for closure device. The sheath was exchanged over the wire for a Perclose prostyle which was utilized for access closure. Immediate hemostasis was achieved. IMPRESSION: 1. Successful mechanical thrombectomy for treatment of a cervical left ICA and left M1/MCA achieving complete recanalization (TICI 3). 2. Underlying severe atherosclerotic disease with evidence of plaque ulceration in the  left carotid bulb with  active clot formation treated with stenting and angioplasty with use of cerebral protection device achieving complete resolution of stenosis. 3. No evidence of hemorrhagic complication. PLAN: Transfer to ICU. Transition to DAPT after follow-up CT. Electronically Signed   By: Baldemar Lenis M.D.   On: 03/03/2023 11:36   IR CT Head Ltd  Result Date: 03/03/2023 INDICATION: 82 year old male presenting to the emergency room with receptive and expressive aphasia; NIHSS 4. His last known well was 9 p.m. on 03/01/2023. This past medical history significant for hypertension, hyperlipidemia, CAD s/p CABG, history of prostate cancer, with recent admission for sepsis related to COVID-19 infection, discharged on 03/01/23. Premorbid modified Rankin scale 0. Initial head CT showed hyperdense left MCA and chronic ischemic findings. Repeat head CT showed hypodensity within the left basal ganglia. CT angiogram of the head and neck showed an occlusion of the left internal carotid artery at the bifurcation in the neck with reconstitution at the cavernous segment and occlusion of the distal left M1/MCA segment. He was taking emergently to our service for mechanical thrombectomy. EXAM: ULTRASOUND-GUIDED VASCULAR ACCESS DIAGNOSTIC CEREBRAL ANGIOGRAM MECHANICAL THROMBECTOMY FLAT PANEL HEAD CT LEFT INTERNAL CAROTID ARTERY ANGIOPLASTY AND STENTING WITH CEREBRAL PROTECTION DEVICE COMPARISON:  CT/CT angiogram of head and neck 03/02/2023. MEDICATIONS: Cangrelor IV bolus and drip. ANESTHESIA/SEDATION: The procedure was performed under general anesthesia. CONTRAST:  180 mL of Omnipaque 300 milligram/mL FLUOROSCOPY: Radiation Exposure Index (as provided by the fluoroscopic device): 2415 mGy Kerma COMPLICATIONS: None immediate. TECHNIQUE: Informed written consent was obtained from the patient's wife after a thorough discussion of the procedural risks, benefits and alternatives. All questions were addressed. Maximal Sterile Barrier  Technique was utilized including caps, mask, sterile gowns, sterile gloves, sterile drape, hand hygiene and skin antiseptic. A timeout was performed prior to the initiation of the procedure. The right groin was prepped and draped in the usual sterile fashion. Using a micropuncture kit and the modified Seldinger technique, access was gained to the right common femoral artery and an 8 French sheath was placed. Real-time ultrasound guidance was utilized for vascular access including the acquisition of a permanent ultrasound image documenting patency of the accessed vessel. Under fluoroscopy, an 8 French Paragon balloon guide catheter was navigated over a 6 [place] Berenstein 2 catheter and a 0.035" Terumo Glidewire into the aortic arch. The catheter was placed into the left common carotid artery. The Berenstein catheter was removed. Frontal and lateral angiograms of the neck and skull base were obtained. FINDINGS: 1. Normal caliber of the right common femoral artery, adequate vascular access. 2. 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 Distal reconstitution of flow to the paraclinoid ICA is seen via ophthalmic artery with occlusion of the left M1/MCA just distal to the origin of a prominent anterior temporal artery. PROCEDURE: Using biplane roadmap guidance, crossing of the cervical left ICA occlusion with a freeclimb 70 catheter and microwire proved unsuccessful. Then, the [facility] 2 catheter was again coaxially advanced into the left common carotid artery. Using biplane roadmap guidance, the Berenstein catheter was advanced over the wire into the distal cervical segment of the left ICA. Then, the [facility] guide catheter was connected to a syringe and advanced over the Berenstein catheter into the cervical left ICA. The Berenstein catheter was then removed. Manual aspiration was performed retrieving large amount of clot. The guide catheter was then aspirated  until clear back bleed was seen without additional clot. Left internal  carotid artery angiograms with frontal and lateral views of the neck and skull base showed recanalization of the cervical left ICA with brisk anterograde flow into the left ACA and left anterior temporal branch with persistent occlusion of the M1 segment. Attempted navigation of an aspiration catheter through the cervical left ICA proved unsuccessful due to residual stenosis. This resulted in retraction of the guide catheter into the left common carotid artery. Left common carotid artery angiograms with frontal and lateral views of the neck showed severe residual stenosis in the cervical left ICA at the bulb. Using biplane roadmap guidance, a 4 x 30 mm Viatrac balloon was navigated over an are startle 14 microwire into the left carotid bulb. Angioplasty was performed under fluoroscopy. The guide catheter was then advanced into the mid cervical segment of the left ICA under fluoroscopic guidance. Using biplane roadmap guidance, a FreeClimb 70 aspiration catheter was navigated over a Tenzing delivery catheter into the cavernous segment of the left ICA. The aspiration catheter was then advanced to the level of occlusion in the left M1 segment and connected to an aspiration pump. Continuous aspiration was performed for 2 minutes. The guide catheter was advanced to the petrous segment and the catheter balloon was inflated. The aspiration catheter was subsequently removed under constant aspiration. The guide catheter was aspirated for debris. Left internal carotid artery angiograms with frontal lateral views of the head showed recanalization of the left MCA vascular tree with decrease caliber and slow flow in a distal M3/MCA segment. Delayed angiograms showed occlusion of this M3 branch. Using biplane roadmap guidance, a Socratic 035 aspiration catheter was navigated over an Aristotle 24 microguidewire into the cavernous segment of the left ICA. The  aspiration catheter was then advanced to the level of occlusion in the M3 segment and connected to an aspiration pump. Continuous aspiration was performed for 2 minutes. The aspiration catheter was subsequently removed under constant aspiration. The guide catheter was aspirated for debris. Left internal carotid artery angiograms with frontal and lateral views the head showed complete recanalization the left MCA vascular tree with slow flow in distal cortical posterior parietal branches. The guide catheter was retracted into the left common carotid artery while maintaining wire access to the cervical left ICA. Frontal and lateral angiograms of the neck were obtained showing residual severe stenosis at the left carotid bulb. Then, a 5 x 30 mm Viatrac balloon was navigated over an are store 14 microguidewire into the left carotid bulb. Angioplasty was performed under fluoroscopy. Left common carotid artery angiograms with frontal and lateral views of the neck showed improvement of the degree of stenosis. However, there is significant luminal irregularity suggestive of plaque ulceration with residual moderate to severe stenosis. Flat panel CT of the head was obtained and post processed in a separate workstation with concurrent attending physician supervision. Selected images were sent to PACS. No evidence of hemorrhagic complication. At this point, patient was loaded IV Cangrelor. Left common carotid artery angiogram with frontal and lateral views of the neck showed reocclusion of the left ICA. The guide catheter was connected to an aspiration pump and advanced over the wire into the left carotid bulb under constant aspiration. New clot was retrieved. Left internal carotid artery angiograms with frontal and lateral views of the head showed recanalization of the left ICA with preserved patency of the left MCA vascular tree. Next, a 4-7 mm Emboshield nav 6 cerebral protection device was advanced into the upper cervical  segment of the left ICA. The  guide catheter was retracted into the left common carotid artery under constant aspiration. Left internal carotid artery angiograms with frontal and lateral views of the head showed residual moderately severe stenosis of the left carotid bulb with very irregular lumen and evidence of plaque ulceration. The cerebral protection device was deployed. Then, a 9-7 mm x 40 mm Xact carotid stent was deployed across the left carotid bifurcation, covering the area of stenosis. Then, a 6 x 30 mm Viatrac balloon was navigated into the recently deployed stent. Angioplasty was performed under fluoroscopy. The balloon was removed and the cerebral section device was recaptured. Left common carotid artery angiograms with frontal and lateral views of the neck showed adequate positioning of the stent with complete resolution of stenosis and brisk anterograde flow. Left internal carotid artery angiograms with frontal and lateral views of the head showed complete patency of the left MCA vascular with resolution of slows flows in distal branches (TICI 3). Brisk flow to the left ACA is also seen. Delayed left common carotid artery angiograms frontal and lateral views of the recently deployed stent showed brisk anterograde flow without evidence of stent thrombosis or residual stenosis. The catheter was subsequently withdrawn. Right common femoral artery angiogram was obtained in right anterior oblique view. The puncture is at the level of the common femoral artery. The artery has normal caliber, adequate for closure device. The sheath was exchanged over the wire for a Perclose prostyle which was utilized for access closure. Immediate hemostasis was achieved. IMPRESSION: 1. Successful mechanical thrombectomy for treatment of a cervical left ICA and left M1/MCA achieving complete recanalization (TICI 3). 2. Underlying severe atherosclerotic disease with evidence of plaque ulceration in the left carotid bulb with  active clot formation treated with stenting and angioplasty with use of cerebral protection device achieving complete resolution of stenosis. 3. No evidence of hemorrhagic complication. PLAN: Transfer to ICU. Transition to DAPT after follow-up CT. Electronically Signed   By: Baldemar Lenis M.D.   On: 03/03/2023 11:36   IR INTRAVSC STENT CERV CAROTID W/EMB-PROT MOD SED  Result Date: 03/03/2023 INDICATION: 82 year old male presenting to the emergency room with receptive and expressive aphasia; NIHSS 4. His last known well was 9 p.m. on 03/01/2023. This past medical history significant for hypertension, hyperlipidemia, CAD s/p CABG, history of prostate cancer, with recent admission for sepsis related to COVID-19 infection, discharged on 03/01/23. Premorbid modified Rankin scale 0. Initial head CT showed hyperdense left MCA and chronic ischemic findings. Repeat head CT showed hypodensity within the left basal ganglia. CT angiogram of the head and neck showed an occlusion of the left internal carotid artery at the bifurcation in the neck with reconstitution at the cavernous segment and occlusion of the distal left M1/MCA segment. He was taking emergently to our service for mechanical thrombectomy. EXAM: ULTRASOUND-GUIDED VASCULAR ACCESS DIAGNOSTIC CEREBRAL ANGIOGRAM MECHANICAL THROMBECTOMY FLAT PANEL HEAD CT LEFT INTERNAL CAROTID ARTERY ANGIOPLASTY AND STENTING WITH CEREBRAL PROTECTION DEVICE COMPARISON:  CT/CT angiogram of head and neck 03/02/2023. MEDICATIONS: Cangrelor IV bolus and drip. ANESTHESIA/SEDATION: The procedure was performed under general anesthesia. CONTRAST:  180 mL of Omnipaque 300 milligram/mL FLUOROSCOPY: Radiation Exposure Index (as provided by the fluoroscopic device): 2415 mGy Kerma COMPLICATIONS: None immediate. TECHNIQUE: Informed written consent was obtained from the patient's wife after a thorough discussion of the procedural risks, benefits and alternatives. All questions were  addressed. Maximal Sterile Barrier Technique was utilized including caps, mask, sterile gowns, sterile gloves, sterile drape, hand hygiene and skin antiseptic. A  timeout was performed prior to the initiation of the procedure. The right groin was prepped and draped in the usual sterile fashion. Using a micropuncture kit and the modified Seldinger technique, access was gained to the right common femoral artery and an 8 French sheath was placed. Real-time ultrasound guidance was utilized for vascular access including the acquisition of a permanent ultrasound image documenting patency of the accessed vessel. Under fluoroscopy, an 8 French Paragon balloon guide catheter was navigated over a 6 [place] Berenstein 2 catheter and a 0.035" Terumo Glidewire into the aortic arch. The catheter was placed into the left common carotid artery. The Berenstein catheter was removed. Frontal and lateral angiograms of the neck and skull base were obtained. FINDINGS: 1. Normal caliber of the right common femoral artery, adequate vascular access. 2. 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 Distal reconstitution of flow to the paraclinoid ICA is seen via ophthalmic artery with occlusion of the left M1/MCA just distal to the origin of a prominent anterior temporal artery. PROCEDURE: Using biplane roadmap guidance, crossing of the cervical left ICA occlusion with a freeclimb 70 catheter and microwire proved unsuccessful. Then, the [facility] 2 catheter was again coaxially advanced into the left common carotid artery. Using biplane roadmap guidance, the Berenstein catheter was advanced over the wire into the distal cervical segment of the left ICA. Then, the [facility] guide catheter was connected to a syringe and advanced over the Berenstein catheter into the cervical left ICA. The Berenstein catheter was then removed. Manual aspiration was performed retrieving large amount of clot. The  guide catheter was then aspirated until clear back bleed was seen without additional clot. Left internal carotid artery angiograms with frontal and lateral views of the neck and skull base showed recanalization of the cervical left ICA with brisk anterograde flow into the left ACA and left anterior temporal branch with persistent occlusion of the M1 segment. Attempted navigation of an aspiration catheter through the cervical left ICA proved unsuccessful due to residual stenosis. This resulted in retraction of the guide catheter into the left common carotid artery. Left common carotid artery angiograms with frontal and lateral views of the neck showed severe residual stenosis in the cervical left ICA at the bulb. Using biplane roadmap guidance, a 4 x 30 mm Viatrac balloon was navigated over an are startle 14 microwire into the left carotid bulb. Angioplasty was performed under fluoroscopy. The guide catheter was then advanced into the mid cervical segment of the left ICA under fluoroscopic guidance. Using biplane roadmap guidance, a FreeClimb 70 aspiration catheter was navigated over a Tenzing delivery catheter into the cavernous segment of the left ICA. The aspiration catheter was then advanced to the level of occlusion in the left M1 segment and connected to an aspiration pump. Continuous aspiration was performed for 2 minutes. The guide catheter was advanced to the petrous segment and the catheter balloon was inflated. The aspiration catheter was subsequently removed under constant aspiration. The guide catheter was aspirated for debris. Left internal carotid artery angiograms with frontal lateral views of the head showed recanalization of the left MCA vascular tree with decrease caliber and slow flow in a distal M3/MCA segment. Delayed angiograms showed occlusion of this M3 branch. Using biplane roadmap guidance, a Socratic 035 aspiration catheter was navigated over an Aristotle 24 microguidewire into the  cavernous segment of the left ICA. The aspiration catheter was then advanced to the level of occlusion in the M3 segment  and connected to an aspiration pump. Continuous aspiration was performed for 2 minutes. The aspiration catheter was subsequently removed under constant aspiration. The guide catheter was aspirated for debris. Left internal carotid artery angiograms with frontal and lateral views the head showed complete recanalization the left MCA vascular tree with slow flow in distal cortical posterior parietal branches. The guide catheter was retracted into the left common carotid artery while maintaining wire access to the cervical left ICA. Frontal and lateral angiograms of the neck were obtained showing residual severe stenosis at the left carotid bulb. Then, a 5 x 30 mm Viatrac balloon was navigated over an are store 14 microguidewire into the left carotid bulb. Angioplasty was performed under fluoroscopy. Left common carotid artery angiograms with frontal and lateral views of the neck showed improvement of the degree of stenosis. However, there is significant luminal irregularity suggestive of plaque ulceration with residual moderate to severe stenosis. Flat panel CT of the head was obtained and post processed in a separate workstation with concurrent attending physician supervision. Selected images were sent to PACS. No evidence of hemorrhagic complication. At this point, patient was loaded IV Cangrelor. Left common carotid artery angiogram with frontal and lateral views of the neck showed reocclusion of the left ICA. The guide catheter was connected to an aspiration pump and advanced over the wire into the left carotid bulb under constant aspiration. New clot was retrieved. Left internal carotid artery angiograms with frontal and lateral views of the head showed recanalization of the left ICA with preserved patency of the left MCA vascular tree. Next, a 4-7 mm Emboshield nav 6 cerebral protection device  was advanced into the upper cervical segment of the left ICA. The guide catheter was retracted into the left common carotid artery under constant aspiration. Left internal carotid artery angiograms with frontal and lateral views of the head showed residual moderately severe stenosis of the left carotid bulb with very irregular lumen and evidence of plaque ulceration. The cerebral protection device was deployed. Then, a 9-7 mm x 40 mm Xact carotid stent was deployed across the left carotid bifurcation, covering the area of stenosis. Then, a 6 x 30 mm Viatrac balloon was navigated into the recently deployed stent. Angioplasty was performed under fluoroscopy. The balloon was removed and the cerebral section device was recaptured. Left common carotid artery angiograms with frontal and lateral views of the neck showed adequate positioning of the stent with complete resolution of stenosis and brisk anterograde flow. Left internal carotid artery angiograms with frontal and lateral views of the head showed complete patency of the left MCA vascular with resolution of slows flows in distal branches (TICI 3). Brisk flow to the left ACA is also seen. Delayed left common carotid artery angiograms frontal and lateral views of the recently deployed stent showed brisk anterograde flow without evidence of stent thrombosis or residual stenosis. The catheter was subsequently withdrawn. Right common femoral artery angiogram was obtained in right anterior oblique view. The puncture is at the level of the common femoral artery. The artery has normal caliber, adequate for closure device. The sheath was exchanged over the wire for a Perclose prostyle which was utilized for access closure. Immediate hemostasis was achieved. IMPRESSION: 1. Successful mechanical thrombectomy for treatment of a cervical left ICA and left M1/MCA achieving complete recanalization (TICI 3). 2. Underlying severe atherosclerotic disease with evidence of plaque  ulceration in the left carotid bulb with active clot formation treated with stenting and angioplasty with use of cerebral protection  device achieving complete resolution of stenosis. 3. No evidence of hemorrhagic complication. PLAN: Transfer to ICU. Transition to [facility]AP[facility] after follow-up CT. Electronically Signed   By: Baldemar Lenis M.D.   On: 03/03/2023 11:36   CT HEAD WO CONTRAST  Result Date: 03/02/2023 CLINICAL DATA:  Stroke, follow up tandem occlusion (ICA-MCA) status post mechanical thrombectomy, angioplasty and stenting. Evaluate for possible hemorrhagic complication in the setting of IV cangrelor. EXAM: CT HEAD WITHOUT CONTRAST TECHNIQUE: Contiguous axial images were obtained from the base of the skull through the vertex without intravenous contrast. RADIATION DOSE REDUCTION: This exam was performed according to the departmental dose-optimization program which includes automated exposure control, adjustment of the mA and/or kV according to patient size and/or use of iterative reconstruction technique. COMPARISON:  Head CT 03/02/2023. FINDINGS: Brain: Faint hyperdensity in the left basal ganglia and left frontal operculum are favored to reflect contrast staining in the setting of recent endovascular intervention. No definite acute hemorrhage. 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 No hydrocephalus or extra-axial collection. No mass effect or midline shift. Vascular: No hyperdense vessel or unexpected calcification. Skull: No calvarial fracture or suspicious bone lesion. Skull base is unremarkable. Sinuses/Orbits: No acute finding. Other: None. IMPRESSION: 1. Faint hyperdensity in the left basal ganglia and left frontal operculum are favored to reflect contrast staining in the setting of recent endovascular intervention. No definite acute hemorrhage. 2. 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 Electronically Signed   By: Orvan Falconer M.D.   On: 03/02/2023 22:00   DG Chest Port 1 View  Result Date: 03/02/2023 CLINICAL DATA:  Respiratory failure EXAM: PORTABLE CHEST 1 VIEW COMPARISON:  02/27/2023 FINDINGS: Endotracheal tube seen 2.5 cm above the carina. Nasogastric tube tip seen within the proximal body of the stomach. Lung volumes are extremely small with bibasilar atelectasis present. No pneumothorax or pleural effusion. Coronary artery bypass grafting has been performed. Cardiac size within normal limits. No acute bone abnormality. IMPRESSION: 1. Support apparatus as described above. 2. Extremely small lung volumes with bibasilar atelectasis. Electronically Signed   By: Helyn Numbers M.D.   On: 03/02/2023 20:50   CT HEAD CODE STROKE WO CONTRAST`  Result Date: 03/02/2023 CLINICAL DATA:  Code stroke. Aphasia, inability to identify objects or follow commands EXAM: CT ANGIOGRAPHY HEAD AND NECK CT PERFUSION BRAIN TECHNIQUE: Multidetector CT imaging of the head and neck was performed using the standard protocol during bolus administration of intravenous contrast. Multiplanar CT image reconstructions and MIPs were obtained to evaluate the vascular anatomy. Carotid stenosis measurements (when applicable) are obtained utilizing NASCET criteria, using the distal internal carotid diameter as the denominator. Multiphase CT imaging of the brain was performed following IV bolus contrast injection. Subsequent parametric perfusion maps were calculated using RAPID software. RADIATION DOSE REDUCTION: This exam was performed according to the departmental dose-optimization program which includes automated exposure control, adjustment of the mA and/or kV according to patient size and/or use of iterative reconstruction technique. CONTRAST:  OMNIPAQUE IOHEXOL 350 MG/ML SOLN COMPARISON:  None Available. FINDINGS: CT HEAD FINDINGS Brain: There is no evidence  of acute intracranial hemorrhage. There is suspected early infarct in the left basal ganglia in the MCA distribution (2-24). ASPECTS is 9. There is parenchymal volume loss with prominence of the ventricular system and extra-axial CSF spaces. There is multifocal encephalomalacia in the left frontal lobe in the MCA distribution likely reflecting prior  infarcts. Additional hypodensity in the supratentorial white matter likely reflects underlying chronic small-vessel ischemic change The pituitary and suprasellar region are normal. There is no mass lesion. There is no mass effect or midline shift. Vascular: See below. Skull: Normal. Negative for fracture or focal lesion. Sinuses/Orbits: There is mild mucosal thickening in the paranasal sinuses. The globes and orbits are unremarkable. Other: The mastoid air cells and middle ear cavities are clear. Review of the MIP images confirms the above findings CTA NECK FINDINGS Aortic arch: The imaged aortic arch is normal. The origins of the major branch vessels are patent. The subclavian arteries are patent to the level imaged. Right carotid system: The right common, internal, and external carotid arteries are patent, with calcified plaque at the bifurcation resulting in less than 50% stenosis. There is no evidence of dissection or aneurysm. Left carotid system: The left common carotid artery is patent. The internal carotid artery is occluded from the bifurcation throughout the remainder of its course in the neck. The external carotid artery is patent. Vertebral arteries: There is moderate stenosis of the origin of the right vertebral artery. There is scattered calcified plaque in the remainder of the vertebral arteries without other significant stenosis or occlusion there is no evidence of dissection or aneurysm. Skeleton: There is no acute osseous abnormality or suspicious osseous lesion. There is no visible canal hematoma. Other neck: The soft tissues of the neck are  unremarkable. Upper chest: The imaged lung apices are clear. There is debris in the esophagus. Review of the MIP images confirms the above findings CTA HEAD FINDINGS Anterior circulation: There is reconstitution of flow in the cavernous segment of the left ICA with linear nonocclusive hypodense filling defect likely reflecting clot. The proximal M1 segment is patent, but there is occlusion of the M1 segment just after the origin of the anterior temporal branch. There is reconstitution of flow in the M2 branches of the bifurcation. There is mild calcified plaque in the right intracranial ICA without significant stenosis. The right M1 segment and distal branches are patent, without proximal stenosis or occlusion. The bilateral ACAS are patent, without proximal stenosis or occlusion. The anterior communicating artery is normal. There is no aneurysm or AVM. Posterior circulation: The bilateral V4 segments are patent. The basilar artery is patent. The left PICA origin is not definitely seen. The other major cerebellar arteries appear patent. The bilateral PCAs are patent, without proximal stenosis or occlusion. Small bilateral posterior communicating arteries are identified. There is no aneurysm or AVM. Venous sinuses: Patent. Anatomic variants: None. Review of the MIP images confirms the above findings CT Brain Perfusion Findings: ASPECTS: 10 CBF (<30%) Volume: 13mL Perfusion (Tmax>6.0s) volume: 70mL Mismatch Volume: 57mL Infarction Location:Left MCA distribution. IMPRESSION: 1. Occluded left internal carotid artery from the bifurcation throughout the remainder of its course in the neck. There is reconstitution of flow in the cavernous segment with nonocclusive hypodense filling defect likely reflecting clot. 2. Occluded left M1 segment just after the anterior temporal branch with reconstitution of flow in the M2 segments of the bifurcation. 3. Possible early infarct in the left basal ganglia.  ASPECTS is 9. 4. 13 cc  infarct core and 57 cc mismatch in the MCA distribution. 5. Moderate right vertebral artery origin stenosis and mild plaque at the right carotid bifurcation. Findings discussed with Dr Selina Cooley at 2:29 pm. Electronically Signed   By: Lesia Hausen M.D.   On: 03/02/2023 14:40   CT ANGIO HEAD NECK W WO CM W PERF (  CODE STROKE)  Result Date: 03/02/2023 CLINICAL DATA:  Code stroke. Aphasia, inability to identify objects or follow commands EXAM: CT ANGIOGRAPHY HEAD AND NECK CT PERFUSION BRAIN TECHNIQUE: Multidetector CT imaging of the head and neck was performed using the standard protocol during bolus administration of intravenous contrast. Multiplanar CT image reconstructions and MIPs were obtained to evaluate the vascular anatomy. Carotid stenosis measurements (when applicable) are obtained utilizing NASCET criteria, using the distal internal carotid diameter as the denominator. Multiphase CT imaging of the brain was performed following IV bolus contrast injection. Subsequent parametric perfusion maps were calculated using RAPID software. RADIATION DOSE REDUCTION: This exam was performed according to the departmental dose-optimization program which includes automated exposure control, adjustment of the mA and/or kV according to patient size and/or use of iterative reconstruction technique. CONTRAST:  OMNIPAQUE IOHEXOL 350 MG/ML SOLN COMPARISON:  None Available. FINDINGS: CT HEAD FINDINGS Brain: There is no evidence of acute intracranial hemorrhage. There is suspected early infarct in the left basal ganglia in the MCA distribution (2-24). ASPECTS is 9. There is parenchymal volume loss with prominence of the ventricular system and extra-axial CSF spaces. There is multifocal encephalomalacia in the left frontal lobe in the MCA distribution likely reflecting prior infarcts. Additional hypodensity in the supratentorial white matter likely reflects underlying chronic small-vessel ischemic change The pituitary and  suprasellar region are normal. There is no mass lesion. There is no mass effect or midline shift. Vascular: See below. Skull: Normal. Negative for fracture or focal lesion. Sinuses/Orbits: There is mild mucosal thickening in the paranasal sinuses. The globes and orbits are unremarkable. Other: The mastoid air cells and middle ear cavities are clear. Review of the MIP images confirms the above findings CTA NECK FINDINGS Aortic arch: The imaged aortic arch is normal. The origins of the major branch vessels are patent. The subclavian arteries are patent to the level imaged. Right carotid system: The right common, internal, and external carotid arteries are patent, with calcified plaque at the bifurcation resulting in less than 50% stenosis. There is no evidence of dissection or aneurysm. Left carotid system: The left common carotid artery is patent. The internal carotid artery is occluded from the bifurcation throughout the remainder of its course in the neck. The external carotid artery is patent. Vertebral arteries: There is moderate stenosis of the origin of the right vertebral artery. There is scattered calcified plaque in the remainder of the vertebral arteries without other significant stenosis or occlusion there is no evidence of dissection or aneurysm. Skeleton: There is no acute osseous abnormality or suspicious osseous lesion. There is no visible canal hematoma. Other neck: The soft tissues of the neck are unremarkable. Upper chest: The imaged lung apices are clear. There is debris in the esophagus. Review of the MIP images confirms the above findings CTA HEAD FINDINGS Anterior circulation: There is reconstitution of flow in the cavernous segment of the left ICA with linear nonocclusive hypodense filling defect likely reflecting clot. The proximal M1 segment is patent, but there is occlusion of the M1 segment just after the origin of the anterior temporal branch. There is reconstitution of flow in the M2  branches of the bifurcation. There is mild calcified plaque in the right intracranial ICA without significant stenosis. The right M1 segment and distal branches are patent, without proximal stenosis or occlusion. The bilateral ACAS are patent, without proximal stenosis or occlusion. The anterior communicating artery is normal. There is no aneurysm or AVM. Posterior circulation: The bilateral V4 segments are patent.  The basilar artery is patent. The left PICA origin is not definitely seen. The other major cerebellar arteries appear patent. The bilateral PCAs are patent, without proximal stenosis or occlusion. Small bilateral posterior communicating arteries are identified. There is no aneurysm or AVM. Venous sinuses: Patent. Anatomic variants: None. Review of the MIP images confirms the above findings CT Brain Perfusion Findings: ASPECTS: 10 CBF (<30%) Volume: 13mL Perfusion (Tmax>6.0s) volume: 70mL Mismatch Volume: 57mL Infarction Location:Left MCA distribution. IMPRESSION: 1. Occluded left internal carotid artery from the bifurcation throughout the remainder of its course in the neck. There is reconstitution of flow in the cavernous segment with nonocclusive hypodense filling defect likely reflecting clot. 2. Occluded left M1 segment just after the anterior temporal branch with reconstitution of flow in the M2 segments of the bifurcation. 3. Possible early infarct in the left basal ganglia.  ASPECTS is 9. 4. 13 cc infarct core and 57 cc mismatch in the MCA distribution. 5. Moderate right vertebral artery origin stenosis and mild plaque at the right carotid bifurcation. Findings discussed with Dr Selina Cooley at 2:29 pm. Electronically Signed   By: Lesia Hausen M.D.   On: 03/02/2023 14:40    Vitals:   03/03/23 0817 03/03/23 0900 03/03/23 1000 03/03/23 1200  BP: (!) 132/57 (!) 133/57 (!) 147/58 138/75  Pulse: (!) 49 (!) 48 (!) 50 (!) 53  Resp: 10 18 (!) 2 14  Temp:      TempSrc:      SpO2: 100% 100% 100% 100%   Weight:         PHYSICAL EXAM General:  Alert, well-nourished, well-developed patient in no acute distress, awake, on ventilator Psych:  Mood and affect appropriate for situation CV: Regular rate and rhythm on monitor Respiratory:  Regular, unlabored respirations on room air GI: Abdomen soft and nontender   NEURO:  Mental Status: Sedated on ventilator but arousable, follows midline commands Speech/Language: speech is without dysarthria or aphasia.  Naming, repetition, fluency, and comprehension intact.  Cranial Nerves:  II: PERRL. Visual fields full.  III, IV, VI: EOMI Limited to the left. Eyelids elevate symmetrically.  V: Sensation is intact to light touch and symmetrical to face.  VII: Face is symmetrical resting and smiling VIII: hearing intact to voice. IX, X: Palate elevates symmetrically. Phonation is normal.  ZO:[id number] shrug 5/5. XII: tongue is midline without fasciculations. Motor: Some right-sided weakness 3 out of 5, all other extremities 5 out of 5. Tone: is normal and bulk is normal Sensation- Intact to light touch bilaterally. Extinction absent to light touch to DSS.   Coordination: FTN intact bilaterally, HKS: no ataxia in BLE.No drift.  Gait- deferred   ASSESSMENT/PLAN  Acute left MCA ischemic infarct in the setting of occluded left ICA s/p IR thrombectomy and stent placement, successful, now with residual 6 mm acute infarct at the junction of the left external capsule and left putamen  Etiology: Left ICA atherosclerotic disease +/- COVID hypercoagulable state.  Patient hyperlipidemia, hypertension, and diabetes type 2 are relatively well-controlled.  We will discharge on aspirin/Brilinta for 3 months followed by Brilinta alone in the setting of ICA stent.  Extubated today, conversing well, moving all extremities and following commands.  We expect this patient to make a very successful recovery considering the severity of his presentation.  Stroke team will  continue to follow.  Code Stroke CT head: Acute vs subacute infarct in the inferior left frontal lobe. ASPECTS  CTA head & neck: Occluded left ICA at bifurcation.  Occluded left  M1 segment.  +/- Early infarct and left basal ganglia. CT perfusion: 13 cc infarct course.  57 cc mismatch in MCA distribution. Post IR CT: Vessel mechanical thrombectomy for treatment of a cervical left ICA and left M1/MCA achieving complete recanalization.  Underlying severe atherosclerotic disease with evidence plaque ulceration and left carotid bulb with active clot formation treated with stenting and angioplasty with use of cerebral protection device achieving complete resolution of stenosis. Post IR MRI : 6 mm acute infarct at the junction of the left external capsule and left putamen.  Chronic cortically-based left MCA territory infarct as described.  Background moderate cerebral white matter chronic small vessel ischemic disease.  Chronic lacunar infarct within the left lentiform nucleus.  Mild generalized cerebral atrophy.  Paranasal sinus disease. 2D Echo: LVEF 60 to 65%.  Left atrial size within normal limits. LDL 46 HgbA1c 6.6 VTE prophylaxis -none in setting of recent IR thrombectomy aspirin 81 mg daily prior to admission, now on aspirin/Brilinta for 3 months and then Brilinta alone. Therapy recommendations: Appreciate PT/OT recs Disposition: TBD  Hypertension Home meds: Amlodipine 10, losartan 100 Stabilizing Blood Pressure Goal: SBP less than 160   Hyperlipidemia Home meds: Lipitor 40 mg, resumed in hospital LDL 46, goal < 70 Continue statin at discharge  Diabetes type II Controlled Home meds: None HgbA1c 6.6, goal < 7.0 CBGs SSI Recommend close follow-up with PCP for better DM control  Dysphagia Patient has post-stroke dysphagia, SLP consulted    Diet   Diet NPO time specified   Advance diet as tolerated  Other Stroke Risk Factors Coronary artery disease status post CABG x  4 Hyperlipidemia Hypertension  Other Active Problems Prostate cancer  Hospital day # 1  Robert Iron, MD Psychiatry resident, PGY 1  STROKE MD NOTE :  I have personally obtained history,examined this patient, reviewed notes, independently viewed imaging studies, participated in medical decision making and plan of care.ROS completed by me personally and pertinent positives fully documented  I have made any additions or clarifications directly to the above note. Agree with note above.  Patient presented with aphasia and right hemiplegia due to left ICA occlusion with distal embolization to the left M1 and underwent successful mechanical thrombectomy requiring rescue left proximal ICA angioplasty and stenting with distal mechanical thrombectomy of the M1 occlusion.  Patient remains sedated and intubated but should be able to be extubated soon as tolerated.  Continue close neurological observation and strict blood pressure control as per post thrombectomy protocol.  Continue aspirin and Brilinta due to recent carotid stent.  Management of COVID status as per critical care team.  Long discussion with wife and daughter at the bedside and answered questions.  Discussed with critical care team.  Discussed with neuro IR team. This patient is critically ill and at significant risk of neurological worsening, death and care requires constant monitoring of vital signs, hemodynamics,respiratory and cardiac monitoring, extensive review of multiple databases, frequent neurological assessment, discussion with family, other specialists and medical decision making of high complexity.I have made any additions or clarifications directly to the above note.This critical care time does not reflect procedure time, or teaching time or supervisory time of PA/NP/Med Resident etc but could involve care discussion time.  I spent 30 minutes of neurocritical care time  in the care of  this patient.     Delia Heady, MD Medical  Director [facility] Pager: [phone] 03/03/2023 8:41 PM  To contact Stroke Continuity provider, please refer to [email or web address]. After hours,  contact General Neurology

## 2023-03-04 DIAGNOSIS — I63232 Cerebral infarction due to unspecified occlusion or stenosis of left carotid arteries: Secondary | ICD-10-CM

## 2023-03-04 LAB — BASIC METABOLIC PANEL
Anion gap: 6 (ref 5–15)
BUN: 22 mg/dL (ref 8–23)
CO2: 25 mmol/L (ref 22–32)
Calcium: 8.1 mg/dL — ABNORMAL LOW (ref 8.9–10.3)
Chloride: 109 mmol/L (ref 98–111)
Creatinine, Ser: 0.9 mg/dL (ref 0.61–1.24)
GFR, Estimated: >60 mL/min (ref >60)
Glucose, Bld: 91 mg/dL (ref 70–99)
Potassium: 3.9 mmol/L (ref 3.5–5.1)
Sodium: 140 mmol/L (ref 135–145)

## 2023-03-04 LAB — GLUCOSE, CAPILLARY
Glucose-Capillary: 140 mg/dL — ABNORMAL HIGH (ref 70–99)
Glucose-Capillary: 147 mg/dL — ABNORMAL HIGH (ref 70–99)
Glucose-Capillary: 151 mg/dL — ABNORMAL HIGH (ref 70–99)
Glucose-Capillary: 153 mg/dL — ABNORMAL HIGH (ref 70–99)
Glucose-Capillary: 155 mg/dL — ABNORMAL HIGH (ref 70–99)
Glucose-Capillary: 90 mg/dL (ref 70–99)
Glucose-Capillary: 91 mg/dL (ref 70–99)

## 2023-03-04 LAB — CULTURE, BLOOD (ROUTINE X 2)
Culture: NO GROWTH
Culture: NO GROWTH

## 2023-03-04 LAB — PHOSPHORUS: Phosphorus: 4.3 mg/dL (ref 2.5–4.6)

## 2023-03-04 MED ORDER — TAMSULOSIN HCL 0.4 MG PO CAPS
0.4000 mg | ORAL_CAPSULE | Freq: Every day | ORAL | Status: DC
  Administered 2023-03-04 – 2023-03-05 (×2): 0.4 mg via ORAL
  Filled 2023-03-04 (×2): qty 1

## 2023-03-04 MED ORDER — INSULIN ASPART 100 UNIT/ML IJ SOLN: 0.0000 [IU] | Freq: Every day | INTRAMUSCULAR | Status: DC

## 2023-03-04 MED ORDER — INSULIN ASPART 100 UNIT/ML IJ SOLN
0.0000 [IU] | Freq: Three times a day (TID) | INTRAMUSCULAR | Status: DC
  Administered 2023-03-05 – 2023-03-06 (×4): 3 [IU] via SUBCUTANEOUS

## 2023-03-04 MED ORDER — AMLODIPINE BESYLATE 10 MG PO TABS
10.0000 mg | ORAL_TABLET | Freq: Every day | ORAL | Status: DC
  Administered 2023-03-05 – 2023-03-06 (×2): 10 mg via ORAL
  Filled 2023-03-04 (×2): qty 1

## 2023-03-04 MED ORDER — LOSARTAN POTASSIUM 50 MG PO TABS
100.0000 mg | ORAL_TABLET | Freq: Every day | ORAL | Status: DC
  Administered 2023-03-04 – 2023-03-06 (×3): 100 mg via ORAL
  Filled 2023-03-04 (×3): qty 2

## 2023-03-04 MED ORDER — PANTOPRAZOLE SODIUM 40 MG PO TBEC
40.0000 mg | DELAYED_RELEASE_TABLET | Freq: Every day | ORAL | Status: DC
  Administered 2023-03-04 – 2023-03-05 (×2): 40 mg via ORAL
  Filled 2023-03-04 (×2): qty 1

## 2023-03-04 NOTE — Progress Notes (Signed)
Inpatient Rehab Admissions Coordinator:  ? ?Per therapy recommendations,  patient was screened for CIR candidacy by Devaney Segers, MS, CCC-SLP. At this time, Pt. Appears to be a a potential candidate for CIR. I will place   order for rehab consult per protocol for full assessment. Please contact me any with questions. ? ?Trine Fread, MS, CCC-SLP ?Rehab Admissions Coordinator  ?[phone] (celll) ?[phone] (office) ? ?

## 2023-03-04 NOTE — Evaluation (Addendum)
Occupational Therapy Evaluation Patient Details Name: Robert [name] MRN: [id number] DOB: 07-18-1940 Today's Date: 03/04/2023   History of Present Illness The pt is an 82 yo male presenting 9/26 with aphasia and inability to name objects. Work up revealed occlusion of L ICA and distal M1, was outside TPA window, taken to IR. S/p thrombectomy 9/26 (R femoral approach), complicated by delayed carotid occlusion requiring stenting and cangrelor. Intubated 9/26-9/27. PMH includes: recent admission for sepsis due to COVID 9/23-9/25, CAD s/p CABG, HTN, and HLD.   Clinical Impression   Pt currently at mod assist level for selfcare sit to stand and for transfers without use of an assistive device.  Increased posterior LOB and LOB to the left with ambulation to and from the bathroom, requiring hand held assist.  Prior to admission pt was independent for selfcare and mobility, living with his spouse.  Vitals stable throughout with HR in the low 80s at rest increasing up to 110 with standing at the sink.  Oxygen sats at 93-94% on room air.  Feel he will benefit from acute care OT at this time in order to increase ADL independence, balance, and safety.  Recommend intensive inpatient follow up therapy, >3 hours/day prior to discharge home to reach safe supervision level.         If plan is discharge home, recommend the following: A little help with walking and/or transfers;A little help with bathing/dressing/bathroom;Assistance with cooking/housework;Help with stairs or ramp for entrance;Assist for transportation;Direct supervision/assist for financial management;Direct supervision/assist for medications management    Functional Status Assessment  Patient has had a recent decline in their functional status and demonstrates the ability to make significant improvements in function in a reasonable and predictable amount of time.  Equipment Recommendations  BSC/3in1    Recommendations for Other Services Rehab  consult     Precautions / Restrictions Precautions Precautions: Fall Restrictions Weight Bearing Restrictions: No      Mobility Bed Mobility Overal bed mobility: Needs Assistance Bed Mobility: Supine to Sit     Supine to sit: Contact guard     General bed mobility comments: Increased time needed to complete    Transfers Overall transfer level: Needs assistance Equipment used: None Transfers: Sit to/from Stand, Bed to chair/wheelchair/BSC Sit to Stand: Min assist     Step pivot transfers: Mod assist     General transfer comment: Increased LOB to the right and posteriorly with mobility without assistive device.   Pt reaching for the end of the bed and door frame for balance.      Balance Overall balance assessment: Needs assistance Sitting-balance support: Feet unsupported Sitting balance-Leahy Scale: Fair Sitting balance - Comments: Pt with LOB left when attempting to cross LE with dressing.     Standing balance-Leahy Scale: Poor Standing balance comment: Needs UE support for mobility                           ADL either performed or assessed with clinical judgement   ADL Overall ADL's : Needs assistance/impaired Eating/Feeding: Independent;Sitting   Grooming: Minimal assistance;Standing;Wash/dry hands   Upper Body Bathing: Supervision/ safety;Sitting Upper Body Bathing Details (indicate cue type and reason): simulated Lower Body Bathing: Moderate assistance;Sit to/from stand   Upper Body Dressing : Supervision/safety;Sitting Upper Body Dressing Details (indicate cue type and reason): simulated sitting Lower Body Dressing: Moderate assistance;Sit to/from stand   Toilet Transfer: Moderate assistance;Ambulation Toilet Transfer Details (indicate cue type and reason): no assistive device  simulated Toileting- Clothing Manipulation and Hygiene: Maximal assistance;Sit to/from stand Toileting - Clothing Manipulation Details (indicate cue type and  reason): Pt with decreased ability to reach his buttocks with the LUE.     Functional mobility during ADLs: Moderate assistance (ambulation without assistive device) General ADL Comments: Pt with decreased flexibility for reaching his LEs to donng gripper socks.  States at home he doesn't wear them however he says he wears lace up shoes and can donn and tie them.     Vision Baseline Vision/History: 1 Wears glasses Ability to See in Adequate Light: 0 Adequate Patient Visual Report: No change from baseline Vision Assessment?: No apparent visual deficits (not formally assessed)     Perception Perception: Within Functional Limits       Praxis Praxis: WFL       Pertinent Vitals/Pain Pain Assessment Pain Assessment: No/denies pain     Extremity/Trunk Assessment Upper Extremity Assessment Upper Extremity Assessment: Overall WFL for tasks assessed   Lower Extremity Assessment Lower Extremity Assessment: Defer to PT evaluation   Cervical / Trunk Assessment Cervical / Trunk Assessment: Normal   Communication Communication Communication: No apparent difficulties Cueing Techniques: Verbal cues   Cognition Arousal: Alert Behavior During Therapy: WFL for tasks assessed/performed Overall Cognitive Status: Impaired/Different from baseline                           Safety/Judgement: Decreased awareness of deficits, Decreased awareness of safety     General Comments: Pt alert and oriented to place, time, and situation.  Decreased initial awareness of balance deficits but able to recognize emergently.                Home Living Family/patient expects to be discharged to:: Private residence Living Arrangements: Spouse/significant other Available Help at Discharge: Available 24 hours/day Type of Home: Other(Comment) (townhouse) Home Access: Level entry     Home Layout: Two level;Able to live on main level with bedroom/bathroom Alternate Level Stairs-Number of  Steps: 14 stairs, only used when company present Alternate Level Stairs-Rails: Left Bathroom Shower/Tub: Producer, television/film/video: Standard Bathroom Accessibility: Yes   Home Equipment: Grab bars - tub/shower;Shower seat          Prior Functioning/Environment Prior Level of Function : Independent/Modified Independent;Driving;History of Falls (last six months)             Mobility Comments: 1 fall in last 6 months, pt states he does not remember but his wife states he triped on a rug. pt reports he is active, likes going out in yard ADLs Comments: pt reports independent, drives, wife does finances and pt manages his medications        OT Problem List: Decreased strength;Decreased activity tolerance;Impaired balance (sitting and/or standing);Decreased cognition;Decreased safety awareness;Decreased knowledge of use of DME or AE      OT Treatment/Interventions: Self-care/ADL training;Therapeutic exercise;DME and/or AE instruction;Balance training;Patient/family education;Cognitive remediation/compensation;Therapeutic activities;Neuromuscular education;Energy conservation    OT Goals(Current goals can be found in the care plan section) Acute Rehab OT Goals Patient Stated Goal: To get his balance better and go home. OT Goal Formulation: With patient/family Time For Goal Achievement: 03/18/23 Potential to Achieve Goals: Good  OT Frequency: Min 1X/week       AM-PAC OT "6 Clicks" Daily Activity     Outcome Measure Help from another person eating meals?: None Help from another person taking care of personal grooming?: A Little Help from another person toileting, which includes  using toliet, bedpan, or urinal?: A Lot Help from another person bathing (including washing, rinsing, drying)?: A Lot Help from another person to put on and taking off regular upper body clothing?: A Little Help from another person to put on and taking off regular lower body clothing?: A Lot 6 Click  Score: 16   End of Session Equipment Utilized During Treatment: Gait belt Nurse Communication: Mobility status  Activity Tolerance: Patient tolerated treatment well Patient left: in chair;with call bell/phone within reach;with family/visitor present;with chair alarm set  OT Visit Diagnosis: Unsteadiness on feet (R26.81);Muscle weakness (generalized) (M62.81);Other symptoms and signs involving cognitive function;Other abnormalities of gait and mobility (R26.89)                Time: 4098-1191 OT Time Calculation (min): 25 min Charges:  OT General Charges $OT Visit: 1 Visit OT Evaluation $OT Eval Moderate Complexity: 1 Mod  Perrin Maltese, OTR/L Acute Rehabilitation Services  Office [phone] 03/04/2023

## 2023-03-04 NOTE — Evaluation (Signed)
Speech Language Pathology Evaluation Patient Details Name: Robert [name] MRN: [id number] DOB: 01/26/1941 Today's Date: 03/04/2023 Time: 7829-5621 SLP Time Calculation (min) (ACUTE ONLY): 25 min  Problem List:  Patient Active Problem List   Diagnosis Date Noted   Stroke (cerebrum) (HCC) 03/02/2023   Sepsis (HCC) 02/27/2023   Abrasion, knee, left, initial encounter 01/29/2019   Unilateral primary osteoarthritis, right knee 08/29/2017   Type 2 diabetes mellitus with diabetic neuropathy, unspecified (HCC) 03/10/2017   Neuropathy, diabetic (HCC) 03/10/2017   Malignant neoplasm of prostate (HCC) 07/07/2016   OBESITY 01/08/2009   EDEMA 01/08/2009   ERECTILE DYSFUNCTION 04/24/2008   BPH with urinary obstruction 04/24/2008   Hyperlipidemia 04/13/2007   Essential hypertension 04/13/2007   Coronary atherosclerosis 04/13/2007   Past Medical History:  Past Medical History:  Diagnosis Date   CAD (coronary artery disease)    per pt cardiologist dr Eden Emms, last office visit 02-05-2010, currently followed by pcp , dr fry   Diverticulosis of colon    ED (erectile dysfunction) of non-organic origin    History of colon polyps    02/ 2009 benign   Hyperlipidemia    Hypertension    Nodular prostate with lower urinary tract symptoms    Prostate cancer [facility]) urologist-  dr wrenn/  oncologist-  dr Kathrynn Running   dx 06-15-2016,  Stage T2a, Gleason 3+3,  PSA 4.64,  vol 23.4cc   S/P CABG x 4 05/02/2005   LIMA to LAD,  radial graft to CFx marginal,  SVG to diagonal and RCA   Wears glasses    Past Surgical History:  Past Surgical History:  Procedure Laterality Date   CARDIAC CATHETERIZATION  04-29-2005  dr w. Samule Ohm   severe 2V CAD-- 90% LAD diagonal, 80% CFx ostium,  50% pRCA   COLONOSCOPY  08/03/2007   per Dr. Russella Dar, benign polyp, he declines to have any more    CORONARY ARTERY BYPASS GRAFT  05-02-2005  dr Zenaida Niece tright   LIMA to LAD,  radial graft to CFx marginal,  SVG to diagonal and RCA    EXICIOSN MASS INDEX FINGER Right 07/2015   per pt benign   IR CT HEAD LTD  03/02/2023   IR INTRAVSC STENT CERV CAROTID W/EMB-PROT MOD SED INCL ANGIO  03/02/2023   IR PERCUTANEOUS ART THROMBECTOMY/INFUSION INTRACRANIAL INC DIAG ANGIO  03/02/2023   IR US GUIDE VASC ACCESS RIGHT  03/02/2023   PROSTATE BIOPSY  06/15/2016   RADIOACTIVE SEED IMPLANT [name] 10/20/2016   Procedure: RADIOACTIVE SEED IMPLANT/BRACHYTHERAPY IMPLANT, 71 seeds implanted, no seeds found in bladder;  Surgeon: Bjorn Pippin, MD;  Location: [facility];  Service: Urology;  Laterality: [name];   RADIOLOGY WITH ANESTHESIA [name] 03/02/2023   Procedure: RADIOLOGY WITH ANESTHESIA;  Surgeon: Radiologist, Medication, MD;  Location: [facility] OR;  Service: Radiology;  Laterality: [name];   HPI:  The pt is an 82 yo male presenting 9/26 with aphasia and inability to name objects. Work up revealed occlusion of L ICA and distal M1, was outside TPA window, taken to IR. S/p thrombectomy 9/26 (R femoral approach), complicated by delayed carotid occlusion requiring stenting and cangrelor. Intubated 9/26-9/27. PMH includes: recent admission for sepsis due to COVID 9/23-9/25, CAD s/p CABG, HTN, and HLD.   Assessment / Plan / Recommendation Clinical Impression  Pt seen for skilled ST services for cognitive and language evaluation. The pt was administered the SLUMs for cognition and WAB-revised for language. Upon arrival, the pt's speech was functionally [facility], per pt and pt's family it  has made significant and spontaneous improvement over the last couple of days. Pt reports no cog concerns and little language concerns. The pts SLUMs score was 13/30, which is well below the normal limits. The pt's relative areas of strengths were: orientation and numeric calculation. The pt's areas of relative weakness were: executive function, visuospatial skills, delayed recall with and without interference and extrapolation. The pt had word finding difficulty and intermittent  perseveration for generative naming task. The pt's Bedside WAB Aphasia score was 90, indicating a mild expressive aphasia. The pt's noted areas of strengths were: object naming, spontaneous speech content, sequential commands, and yes/no questions. The pt's general areas of weaknesses were: repetition and spontaneous speech fluency. Pt had occasional phonemic paraphasia and disfluencies. Pt presents with a mild unspecified expressive aphasia and a moderate cognitive impairment. The pt would benefit from continued ST in outpatient or through [facility] to address cog and language concerns. No further acute needs, SLP signing off.    SLP Assessment       Recommendations for follow up therapy are one component of a multi-disciplinary discharge planning process, led by the attending physician.  Recommendations may be updated based on patient status, additional functional criteria and insurance authorization.    Follow Up Recommendations  Outpatient SLP    Assistance Recommended at Discharge  PRN  Functional Status Assessment Patient has had a recent decline in their functional status and demonstrates the ability to make significant improvements in function in a reasonable and predictable amount of time.  Frequency and Duration           SLP Evaluation Cognition  Overall Cognitive Status: Impaired/Different from baseline Arousal/Alertness: Awake/alert Orientation Level: Oriented X4 Year: 2024 Day of Week: Correct Attention: Sustained Memory: Impaired Memory Impairment: Retrieval deficit;Decreased recall of new information;Decreased short term memory Decreased Short Term Memory: Functional basic Awareness: Appears intact Problem Solving: Impaired Problem Solving Impairment: Functional complex Executive Function: Self Correcting;Organizing;Sequencing Sequencing: Impaired Sequencing Impairment: Functional basic Organizing: Impaired Organizing Impairment: Functional complex Self Correcting:  Appears intact Safety/Judgment: Appears intact Comments: Pt reports increased difficulty with       Comprehension  Auditory Comprehension Overall Auditory Comprehension: Appears within functional limits for tasks assessed Yes/No Questions: Within Functional Limits Commands: Within Functional Limits Conversation: Complex Reading Comprehension Reading Status: Not tested    Expression Expression Primary Mode of Expression: Verbal Verbal Expression Overall Verbal Expression: Impaired Initiation: No impairment Automatic Speech: Name;Month of year;Day of week Level of Generative/Spontaneous Verbalization: Conversation Repetition: Impaired Level of Impairment: Sentence level Naming: No impairment Responsive: 76-100% accurate Confrontation: Within functional limits Divergent: 50-74% accurate Verbal Errors: Perseveration;Phonemic paraphasias;Aware of errors Pragmatics: No impairment Written Expression Dominant Hand: Right Written Expression: Not tested   Oral / Motor  Motor Speech Respiration: Within functional limits Phonation: Normal Resonance: Within functional limits Articulation: Within functional limitis Intelligibility: Intelligible            Dione Housekeeper M.S. CCC-SLP

## 2023-03-04 NOTE — Progress Notes (Signed)
STROKE TEAM PROGRESS NOTE   BRIEF HPI [name] is a 82 y.o. male with history of hypertension, CAD status post CABG x 4, history of prostate cancer, with recent admission for sepsis secondary to COVID-19 infection who presented to the [facility] ED with global aphasia, found to have occluded left ICA clot with blocked left M1 segment and left basal ganglia infarct.   SIGNIFICANT HOSPITAL EVENTS 9/26: Code stroke called.  Perfusion with 13 cc infarction 57 cc mismatch.  IR activated.  Status post mechanical thrombectomy of left MCA and stenting of left ICA.  Required intubation. 9/27: Patient now extubated, conversant, moving all limbs, talking.  Loaded on Brilinta  INTERIM HISTORY/SUBJECTIVE Wife at the bedside. Pt sitting in chair, still has frequent paraphasic errors but improved per wife. No significant weakness. On ASA and brilinta. PT and OT recommend CIR but pt wants to go home.   OBJECTIVE  CBC    Component Value Date/Time   WBC 10.4 03/03/2023 0721   RBC 3.92 (L) 03/03/2023 0721   HGB 12.7 (L) 03/03/2023 0721   HCT 38.3 (L) 03/03/2023 0721   PLT 306 03/03/2023 0721   MCV 97.7 03/03/2023 0721   MCH 32.4 03/03/2023 0721   MCHC 33.2 03/03/2023 0721   RDW 13.4 03/03/2023 0721   LYMPHSABS 1.1 03/02/2023 1041   MONOABS 0.8 03/02/2023 1041   EOSABS 0.0 03/02/2023 1041   BASOSABS 0.0 03/02/2023 1041    BMET    Component Value Date/Time   NA 140 03/04/2023 0805   K 3.9 03/04/2023 0805   CL 109 03/04/2023 0805   CO2 25 03/04/2023 0805   GLUCOSE 91 03/04/2023 0805   BUN 22 03/04/2023 0805   CREATININE 0.90 03/04/2023 0805   CREATININE 0.91 04/06/2020 0851   CALCIUM 8.1 (L) 03/04/2023 0805   GFRNONAA >60 03/04/2023 0805   GFRNONAA 81 04/06/2020 0851    IMAGING past 24 hours ECHOCARDIOGRAM COMPLETE  Result Date: 03/03/2023    ECHOCARDIOGRAM REPORT   Patient Name:   Robert [name] Date of Exam: 03/03/2023 Medical Rec #:  [id number]         Height:        66.0 in Accession #:    [id number]        Weight:       230.2 lb Date of Birth:  1940-11-29         BSA:          2.123 m Patient Age:    82 years          BP:           149/57 mmHg Patient Gender: M                 HR:           53 bpm. Exam Location:  Inpatient Procedure: 2D Echo, Cardiac Doppler and Color Doppler Indications:    Stroke I63.9  History:        Patient has no prior history of Echocardiogram examinations.                 CAD, Prior CABG, Stroke; Risk Factors:Hypertension, Diabetes and                 Dyslipidemia.  Sonographer:    Lucendia Herrlich RCS Referring Phys: [phone] DENISE A WOLFE IMPRESSIONS  1. Left ventricular ejection fraction, by estimation, is 60 to 65%. The left ventricle has normal function. The left ventricle has no  regional wall motion abnormalities. Left ventricular diastolic parameters are consistent with Grade I diastolic dysfunction (impaired relaxation).  2. Right ventricular systolic function is normal. The right ventricular size is normal. There is normal pulmonary artery systolic pressure.  3. The mitral valve is normal in structure. Trivial mitral valve regurgitation. No evidence of mitral stenosis. Moderate mitral annular calcification.  4. The aortic valve is calcified. There is mild calcification of the aortic valve. There is mild thickening of the aortic valve. Aortic valve regurgitation is not visualized. No aortic stenosis is present.  5. The inferior vena cava is normal in size with greater than 50% respiratory variability, suggesting right atrial pressure of 3 mmHg. FINDINGS  Left Ventricle: Left ventricular ejection fraction, by estimation, is 60 to 65%. The left ventricle has normal function. The left ventricle has no regional wall motion abnormalities. The left ventricular internal cavity size was normal in size. There is  no left ventricular hypertrophy. Left ventricular diastolic parameters are consistent with Grade I diastolic dysfunction (impaired relaxation).  Indeterminate filling pressures. Right Ventricle: The right ventricular size is normal. No increase in right ventricular wall thickness. Right ventricular systolic function is normal. There is normal pulmonary artery systolic pressure. The tricuspid regurgitant velocity is 2.67 m/s, and  with an assumed right atrial pressure of 3 mmHg, the estimated right ventricular systolic pressure is 31.5 mmHg. Left Atrium: Left atrial size was normal in size. Right Atrium: Right atrial size was normal in size. Pericardium: There is no evidence of pericardial effusion. Mitral Valve: The mitral valve is normal in structure. Moderate mitral annular calcification. Trivial mitral valve regurgitation. No evidence of mitral valve stenosis. Tricuspid Valve: The tricuspid valve is normal in structure. Tricuspid valve regurgitation is mild . No evidence of tricuspid stenosis. Aortic Valve: The aortic valve is calcified. There is mild calcification of the aortic valve. There is mild thickening of the aortic valve. Aortic valve regurgitation is not visualized. No aortic stenosis is present. Aortic valve peak gradient measures 7.2 mmHg. Pulmonic Valve: The pulmonic valve was normal in structure. Pulmonic valve regurgitation is not visualized. No evidence of pulmonic stenosis. Aorta: The aortic root is normal in size and structure. Venous: The inferior vena cava is normal in size with greater than 50% respiratory variability, suggesting right atrial pressure of 3 mmHg. IAS/Shunts: No atrial level shunt detected by color flow Doppler.  LEFT VENTRICLE PLAX 2D LVIDd:         3.70 cm   Diastology LVIDs:         2.30 cm   LV e' medial:    6.22 cm/s LV PW:         1.29 cm   LV E/e' medial:  12.3 LV IVS:        1.30 cm   LV e' lateral:   8.41 cm/s LVOT diam:     2.00 cm   LV E/e' lateral: 9.1 LV SV:         58 LV SV Index:   27 LVOT Area:     3.14 cm  RIGHT VENTRICLE RV S prime:     6.98 cm/s TAPSE (M-mode): 1.1 cm LEFT ATRIUM           Index         RIGHT ATRIUM           Index LA diam:      3.90 cm 1.84 cm/m   RA Area:     13.40 cm LA Vol (A2C): 54.4  ml 25.63 ml/m  RA Volume:   30.10 ml  14.18 ml/m LA Vol (A4C): 60.8 ml 28.64 ml/m  AORTIC VALVE AV Area (Vmax): 2.05 cm AV Vmax:        134.00 cm/s AV Peak Grad:   7.2 mmHg LVOT Vmax:      87.45 cm/s LVOT Vmean:     55.275 cm/s LVOT VTI:       0.186 m  AORTA Ao Root diam: 2.90 cm Ao Asc diam:  3.50 cm MITRAL VALVE               TRICUSPID VALVE MV Area (PHT): 3.85 cm    TR Peak grad:   28.5 mmHg MV Decel Time: 197 msec    TR Vmax:        267.00 cm/s MR Peak grad: 68.9 mmHg MR Vmax:      415.00 cm/s  SHUNTS MV E velocity: 76.30 cm/s  Systemic VTI:  0.19 m MV A velocity: 75.40 cm/s  Systemic Diam: 2.00 cm MV E/A ratio:  1.01 Chilton Si MD Electronically signed by Chilton Si MD Signature Date/Time: 03/03/2023/3:20:20 PM    Final    MR BRAIN WO CONTRAST  Result Date: 03/03/2023 CLINICAL DATA:  Provided history: Neuro deficit, acute, stroke suspected. EXAM: MRI HEAD WITHOUT CONTRAST TECHNIQUE: Multiplanar, multiecho pulse sequences of the brain and surrounding structures were obtained without intravenous contrast. COMPARISON:  Prior head CT examinations 03/02/2023. CT angiogram head/neck 03/02/2023. Interventional radiology report from 03/02/2023. FINDINGS: Brain: Mild generalized parenchymal atrophy. 6 mm acute infarct at the junction of the left external capsule and putamen posteriorly (for instance as seen on series 2, image 23). Small to moderate-sized chronic cortical/subcortical infarct within the anterior left frontal lobe and anterior left insula. Small chronic cortically-based infarcts within the left frontoparietal operculum and more posteriorly within the left parietal lobe. Background moderate multifocal T2 FLAIR hyperintense signal abnormality within the cerebral white matter, nonspecific but compatible with chronic small vessel ischemic disease. Chronic lacunar infarct within the  left lentiform nucleus. No evidence of an intracranial mass. No chronic intracranial blood products. No extra-axial fluid collection. No midline shift. Vascular: Maintained flow voids within the proximal large arterial vessels. Skull and upper cervical spine: No focal suspicious marrow lesion. Sinuses/Orbits: No mass or acute finding within the imaged orbits. Mild paranasal sinus mucosal thickening. Possible small fluid levels within the sphenoid sinuses. Impression #1 will be called to the ordering clinician or representative by the Radiologist Assistant, and communication documented in the PACS or [employer]. IMPRESSION: 1. 6 mm acute infarct at the junction of the left external capsule and left putamen. 2. Chronic cortically-based left MCA territory infarcts as described. 3. Background moderate cerebral white matter chronic small vessel ischemic disease. 4. Chronic lacunar infarct within the left lentiform nucleus. 5. Mild generalized cerebral atrophy. 6. Paranasal sinus disease as described. Electronically Signed   By: Jackey Loge D.O.   On: 03/03/2023 12:15    Vitals:   03/04/23 0630 03/04/23 0700 03/04/23 0800 03/04/23 0900  BP: (!) 127/49 134/60 (!) 148/68 (!) 138/59  Pulse: 74 62 61 96  Resp: 20 18 18  (!) 30  Temp:      TempSrc:      SpO2: 95% 97% 94% 90%  Weight:         PHYSICAL EXAM  Temp:  [98 F (36.7 C)-98.1 F (36.7 C)] 98.1 F (36.7 C) (09/28 0330) Pulse Rate:  [53-96] 96 (09/28 0900) Resp:  [5-30] 30 (09/28 0900)  BP: (86-153)/(42-75) 138/59 (09/28 0900) SpO2:  [90 %-100 %] 90 % (09/28 0900) FiO2 (%):  [40 %] 40 % (09/27 1114)  General - Well nourished, well developed, in no apparent distress.  Ophthalmologic - fundi not visualized due to noncooperation.  Cardiovascular - Regular rhythm and rate.  Mental Status -  Level of arousal and orientation to time, place, and person were intact. Language exam showed frequent paraphasic errors, but following all  commands, able to name and repeat simple sentences. Has difficulty with paraphasic errors repeating complex sentences.   Cranial Nerves II - XII - II - Visual field intact OU. III, IV, VI - Extraocular movements intact. V - Facial sensation intact bilaterally. VII - Facial movement intact bilaterally. VIII - Hearing & vestibular intact bilaterally. X - Palate elevates symmetrically. XI - Chin turning & shoulder shrug intact bilaterally. XII - Tongue protrusion intact.  Motor Strength - The patient's strength was normal in all extremities and pronator drift was absent.  Bulk was normal and fasciculations were absent.   Motor Tone - Muscle tone was assessed at the neck and appendages and was normal.  Reflexes - The patient's reflexes were symmetrical in all extremities and he had no pathological reflexes.  Sensory - Light touch, temperature/pinprick were assessed and were symmetrical.    Coordination - The patient had normal movements in the hands with no ataxia or dysmetria.  Tremor was absent.  Gait and Station - deferred.   ASSESSMENT/PLAN  Stroke - Acute left BG ischemic infarct with left ICA and M1 occlusion s/p IR with TICI3 and carotid stent, likely large vessel disease Code Stroke CT head: likely chronic infarct in the inferior left frontal lobe.  CTA head & neck: Occluded left ICA at bifurcation.  Occluded left M1 segment.  +/- Early infarct and left basal ganglia. CT perfusion: 13/70 S/p IR with TICI3 and reocclusion of carotid followed by carotid stenting MRI : 6 mm acute infarct at the junction of the left external capsule and left putamen.  Chronic cortically-based left MCA territory infarct as described.   2D Echo: LVEF 60 to 65%.  Left atrial size within normal limits. LDL 46 HgbA1c 6.6 VTE prophylaxis - SCDs aspirin 81 mg daily prior to admission, now on aspirin and Brilinta for carotid stenting. Duration per IR Therapy recommendations: CIR, but pt does not want  CIR Disposition: pending   Hypertension Home meds: Amlodipine 10, losartan 100 Stable on the high end Resume home BP meds Long term BP goal normotensive  Hyperlipidemia Home meds: Lipitor 40 mg, resumed in hospital LDL 46, goal < 70 Continue statin at discharge  Other Stroke Risk Factors Coronary artery disease status post CABG x 4 Advanced age  Other Active Problems Prostate cancer Recent COVID infection with sepsis  Hospital day # 2  This patient is critically ill due to stroke with left ICA occlusion and MCA occlusion s/p IR with carotid stenting, recent COVID infection and at significant risk of neurological worsening, death form recurrent stroke, hemorrhagic conversion, sepsis. This patient's care requires constant monitoring of vital signs, hemodynamics, respiratory and cardiac monitoring, review of multiple databases, neurological assessment, discussion with family, other specialists and medical decision making of high complexity. I spent 35 minutes of neurocritical care time in the care of this patient. I had long discussion with pt and wife at bedside, updated pt current condition, treatment plan and potential prognosis, and answered all the questions. They expressed understanding and appreciation.   Marvel Plan, MD PhD  Stroke Neurology 03/04/2023 8:35 PM    To contact Stroke Continuity provider, please refer to [email or web address]. After hours, contact General Neurology

## 2023-03-04 NOTE — Progress Notes (Signed)
Received pt to the unit.

## 2023-03-04 NOTE — Evaluation (Signed)
Physical Therapy Evaluation Patient Details Name: Robert [name] MRN: [id number] DOB: [birth year] Today's Date: 03/04/2023  History of Present Illness  The pt is an 82 yo male presenting 9/26 with aphasia and inability to name objects. Work up revealed occlusion of L ICA and distal M1, was outside TPA window, taken to IR. S/p thrombectomy 9/26 (R femoral approach), complicated by delayed carotid occlusion requiring stenting and cangrelor. Intubated 9/26-9/27. PMH includes: recent admission for sepsis due to COVID 9/23-9/25, CAD s/p CABG, HTN, and HLD.   Clinical Impression  Pt in bed upon arrival of PT, agreeable to evaluation at this time. Prior to admission the pt was independent without need for DME, reports his wife managed finances but otherwise he was independent with ADLs and IADLs. The pt required minA for sit-stand transfers and min-modA to manage short bout of ambulation in the room as pt with staggering steps and reaching for UE support from therapist and environment. Will benefit from use of RW, but pt is hopeful to return to independence, is hopeful that with short stint or intensive therapies he can return home at supervision level so that wife does not need to physically assist with mobility.       If plan is discharge home, recommend the following: A little help with walking and/or transfers;A little help with bathing/dressing/bathroom;Assistance with cooking/housework;Direct supervision/assist for medications management;Direct supervision/assist for financial management;Assist for transportation;Help with stairs or ramp for entrance   Can travel by private vehicle        Equipment Recommendations Rolling walker (2 wheels)  Recommendations for Other Services  Rehab consult    Functional Status Assessment Patient has had a recent decline in their functional status and demonstrates the ability to make significant improvements in function in a reasonable and predictable amount of  time.     Precautions / Restrictions Precautions Precautions: Fall Restrictions Weight Bearing Restrictions: No      Mobility  Bed Mobility Overal bed mobility: Needs Assistance Bed Mobility: Supine to Sit     Supine to sit: Contact guard, Used rails     General bed mobility comments: Increased time needed to complete    Transfers Overall transfer level: Needs assistance Equipment used: 1 person hand held assist Transfers: Sit to/from Stand, Bed to chair/wheelchair/BSC Sit to Stand: Min assist   Step pivot transfers: Mod assist       General transfer comment: Increased LOB to the right and posteriorly with mobility without assistive device.   Pt reaching for the end of the bed and door frame for balance.    Ambulation/Gait Ambulation/Gait assistance: Mod assist Gait Distance (Feet): 15 Feet Assistive device: 1 person hand held assist Gait Pattern/deviations: Step-through pattern, Decreased stride length, Shuffle, Staggering left, Staggering right, Narrow base of support Gait velocity: decreased     General Gait Details: pt with multiple staggering steps, reaching for UE support from therapist and furniture, poor insight to deficits, reports no need for assistance despite reaching for assistance.   Modified Rankin (Stroke Patients Only) Modified Rankin (Stroke Patients Only) Pre-Morbid Rankin Score: No symptoms Modified Rankin: Moderately severe disability     Balance Overall balance assessment: Needs assistance Sitting-balance support: Feet unsupported Sitting balance-Leahy Scale: Fair Sitting balance - Comments: Pt with LOB left when attempting to cross LE with dressing.   Standing balance support: No upper extremity supported Standing balance-Leahy Scale: Poor Standing balance comment: Needs UE support for mobility  Pertinent Vitals/Pain Pain Assessment Pain Assessment: No/denies pain    Home Living  Family/patient expects to be discharged to:: Private residence Living Arrangements: Spouse/significant other Available Help at Discharge: Available 24 hours/day Type of Home: Other(Comment) (townhouse) Home Access: Level entry     Alternate Level Stairs-Number of Steps: 14 stairs, only used when company present Home Layout: Two level;Able to live on main level with bedroom/bathroom Home Equipment: Grab bars - tub/shower;Shower seat      Prior Function Prior Level of Function : Independent/Modified Independent;Driving;History of Falls (last six months)             Mobility Comments: 1 fall in last 6 months, pt states he does not remember but his wife states he triped on a rug. pt reports he is active, likes going out in yard ADLs Comments: pt reports independent, drives, wife does finances and pt manages his medications     Extremity/Trunk Assessment   Upper Extremity Assessment Upper Extremity Assessment: Defer to OT evaluation    Lower Extremity Assessment Lower Extremity Assessment: Overall WFL for tasks assessed    Cervical / Trunk Assessment Cervical / Trunk Assessment: Normal  Communication   Communication Communication: No apparent difficulties Cueing Techniques: Verbal cues  Cognition Arousal: Alert Behavior During Therapy: WFL for tasks assessed/performed Overall Cognitive Status: Impaired/Different from baseline Area of Impairment: Memory, Safety/judgement                     Memory: Decreased short-term memory   Safety/Judgement: Decreased awareness of deficits, Decreased awareness of safety     General Comments: Pt alert and oriented to place, time, and situation.  Decreased initial awareness of balance deficits but able to recognize emergently.        General Comments General comments (skin integrity, edema, etc.): VSS, femoral site clean, dry, and intact. pt denies any pain. poor insight to deficits    Exercises     Assessment/Plan     PT Assessment Patient needs continued PT services  PT Problem List Decreased strength;Decreased activity tolerance;Decreased balance;Decreased mobility;Decreased coordination       PT Treatment Interventions DME instruction;Gait training;Stair training;Functional mobility training;Balance training;Therapeutic activities;Therapeutic exercise;Neuromuscular re-education    PT Goals (Current goals can be found in the Care Plan section)  Acute Rehab PT Goals Patient Stated Goal: return home and to independence PT Goal Formulation: With patient Time For Goal Achievement: 03/18/23 Potential to Achieve Goals: Good    Frequency Min 1X/week     Co-evaluation PT/OT/SLP Co-Evaluation/Treatment: Yes Reason for Co-Treatment: To address functional/ADL transfers;For patient/therapist safety PT goals addressed during session: Mobility/safety with mobility;Balance;Strengthening/ROM         AM-PAC PT "6 Clicks" Mobility  Outcome Measure Help needed turning from your back to your side while in a flat bed without using bedrails?: A Little Help needed moving from lying on your back to sitting on the side of a flat bed without using bedrails?: A Little Help needed moving to and from a bed to a chair (including a wheelchair)?: A Little Help needed standing up from a chair using your arms (e.g., wheelchair or bedside chair)?: A Little Help needed to walk in hospital room?: A Lot Help needed climbing 3-5 steps with a railing? : A Lot 6 Click Score: 16    End of Session Equipment Utilized During Treatment: Gait belt Activity Tolerance: Patient tolerated treatment well Patient left: in chair;with call bell/phone within reach;with chair alarm set;with family/visitor present Nurse Communication: Mobility status PT Visit  Diagnosis: Unsteadiness on feet (R26.81);Other abnormalities of gait and mobility (R26.89);Muscle weakness (generalized) (M62.81);Difficulty in walking, not elsewhere classified  (R26.2)    Time: 5409-8119 PT Time Calculation (min) (ACUTE ONLY): 33 min   Charges:   PT Evaluation $PT Eval Moderate Complexity: 1 Mod   PT General Charges $$ ACUTE PT VISIT: 1 Visit         Vickki Muff, PT, DPT   Acute Rehabilitation Department Office [phone] Secure Chat Communication Preferred  Ronnie Derby 03/04/2023, 1:20 PM

## 2023-03-05 DIAGNOSIS — I63232 Cerebral infarction due to unspecified occlusion or stenosis of left carotid arteries: Secondary | ICD-10-CM | POA: Diagnosis not present

## 2023-03-05 LAB — GLUCOSE, CAPILLARY
Glucose-Capillary: 137 mg/dL — ABNORMAL HIGH (ref 70–99)
Glucose-Capillary: 123 mg/dL — ABNORMAL HIGH (ref 70–99)
Glucose-Capillary: 124 mg/dL — ABNORMAL HIGH (ref 70–99)
Glucose-Capillary: 177 mg/dL — ABNORMAL HIGH (ref 70–99)

## 2023-03-05 MED ORDER — MELATONIN 3 MG PO TABS
3.0000 mg | ORAL_TABLET | Freq: Every day | ORAL | Status: DC
  Administered 2023-03-05: 3 mg via ORAL
  Filled 2023-03-05: qty 1

## 2023-03-05 NOTE — Progress Notes (Signed)
Physical Therapy Treatment Patient Details Name: Robert [name] MRN: [id number] DOB: June 13, 1940 Today's Date: 03/05/2023   History of Present Illness The pt is an 82 yo male presenting 9/26 with aphasia and inability to name objects. Work up revealed occlusion of L ICA and distal M1, was outside TPA window, taken to IR. S/p thrombectomy 9/26 (R femoral approach), complicated by delayed carotid occlusion requiring stenting and cangrelor. Intubated 9/26-9/27. PMH includes: recent admission for sepsis due to COVID 9/23-9/25, CAD s/p CABG, HTN, and HLD.    PT Comments  The pt was seen for continued mobility progression and at request of MD to see if pt could progress to d/c home with HHPT. The pt was able to demo great progress with safety awareness and stability this session, as well as generally improved endurance. He was able to complete sit-stand transfers with CGA and ambulated in hallway with CGA with use of RW (compared to modA for gait yesterday). He continues to demo deficits in dynamic stability, especially with addition of balance challenges such as head turns and changes in direction, and will therefore continue to benefit from skilled PT to progress back towards initial independence. The pt's daughter was also present and reports that in addition to the patient's wife, additional family can be present for support and assist as needed. Discussed importance of fall prevention and strategies to implement after initial d/c home. Will benefit from additional acute PT visit tomorrow if possible.    Dynamic Gait Index (DGI): 12/24 (<19 indicates increased risk for falls)    If plan is discharge home, recommend the following: A little help with walking and/or transfers;A little help with bathing/dressing/bathroom;Assistance with cooking/housework;Direct supervision/assist for medications management;Direct supervision/assist for financial management;Assist for transportation;Help with stairs or ramp  for entrance   Can travel by private vehicle        Equipment Recommendations  Rolling walker (2 wheels) (family thinks they have a BSC)    Recommendations for Other Services       Precautions / Restrictions Precautions Precautions: Fall Restrictions Weight Bearing Restrictions: No     Mobility  Bed Mobility Overal bed mobility: Needs Assistance             General bed mobility comments: pt OOB in recliner at start and end of session    Transfers Overall transfer level: Needs assistance Equipment used: Rolling walker (2 wheels), 1 person hand held assist Transfers: Sit to/from Stand, Bed to chair/wheelchair/BSC Sit to Stand: Contact guard assist, Min assist   Step pivot transfers: Contact guard assist       General transfer comment: minA with HHA, CGA from recliner. cues for hand placement on armrests    Ambulation/Gait Ambulation/Gait assistance: Min assist, Contact guard assist Gait Distance (Feet): 300 Feet Assistive device: Rolling walker (2 wheels) Gait Pattern/deviations: Step-through pattern, Decreased stride length, Trunk flexed Gait velocity: decreased Gait velocity interpretation: <1.31 ft/sec, indicative of household ambulator   General Gait Details: pt with flexed trunk but no staggering steps or LOB this session. He needed cues for directions and was unable to remember multi-step commands, but demos good stability even with minor challenge with BUE support. minA with HHA compared to modA of yesterday, CGA with RW   Stairs Stairs: Yes Stairs assistance: Contact guard assist Stair Management: Two rails, Step to pattern, Forwards Number of Stairs: 2     Modified Rankin (Stroke Patients Only) Modified Rankin (Stroke Patients Only) Pre-Morbid Rankin Score: No symptoms Modified Rankin: Moderately severe disability  Balance Overall balance assessment: Needs assistance Sitting-balance support: Feet unsupported Sitting balance-Leahy Scale:  Fair Sitting balance - Comments: no LOB with static sitting   Standing balance support: Bilateral upper extremity supported, Single extremity supported, During functional activity Standing balance-Leahy Scale: Fair Standing balance comment: best stability with BUE support, poor tolerance of challenge                 Standardized Balance Assessment Standardized Balance Assessment : Dynamic Gait Index   Dynamic Gait Index Level Surface: Mild Impairment Change in Gait Speed: Mild Impairment Gait with Horizontal Head Turns: Moderate Impairment Gait with Vertical Head Turns: Moderate Impairment Gait and Pivot Turn: Mild Impairment Step Over Obstacle: Mild Impairment Step Around Obstacles: Moderate Impairment Steps: Moderate Impairment Total Score: 12      Cognition Arousal: Alert Behavior During Therapy: WFL for tasks assessed/performed Overall Cognitive Status: Impaired/Different from baseline Area of Impairment: Memory, Safety/judgement                     Memory: Decreased short-term memory   Safety/Judgement: Decreased awareness of deficits, Decreased awareness of safety     General Comments: Pt alert and oriented to place, time, and situation. at times limited by [facility], pt also with poor STM multiple times in session but responds well to simple instructions and able to demo som problem solving when reading hallway signs. daughter reports it is approaching normal        Exercises      General Comments General comments (skin integrity, edema, etc.): daughter present and supportive, VSS      Pertinent Vitals/Pain Pain Assessment Pain Assessment: No/denies pain     PT Goals (current goals can now be found in the care plan section) Acute Rehab PT Goals Patient Stated Goal: return home and to independence PT Goal Formulation: With patient Time For Goal Achievement: 03/18/23 Potential to Achieve Goals: Good Progress towards PT goals: Progressing toward  goals    Frequency    Min 1X/week       AM-PAC PT "6 Clicks" Mobility   Outcome Measure  Help needed turning from your back to your side while in a flat bed without using bedrails?: A Little Help needed moving from lying on your back to sitting on the side of a flat bed without using bedrails?: A Little Help needed moving to and from a bed to a chair (including a wheelchair)?: A Little Help needed standing up from a chair using your arms (e.g., wheelchair or bedside chair)?: A Little Help needed to walk in hospital room?: A Little Help needed climbing 3-5 steps with a railing? : A Little 6 Click Score: 18    End of Session Equipment Utilized During Treatment: Gait belt Activity Tolerance: Patient tolerated treatment well Patient left: in chair;with call bell/phone within reach;with chair alarm set;with family/visitor present Nurse Communication: Mobility status PT Visit Diagnosis: Unsteadiness on feet (R26.81);Other abnormalities of gait and mobility (R26.89);Muscle weakness (generalized) (M62.81);Difficulty in walking, not elsewhere classified (R26.2)     Time: 2956-2130 PT Time Calculation (min) (ACUTE ONLY): 24 min  Charges:    $Gait Training: 8-22 mins $Therapeutic Activity: 8-22 mins PT General Charges $$ ACUTE PT VISIT: 1 Visit                     Vickki Muff, PT, DPT   Acute Rehabilitation Department Office [phone] Secure Chat Communication Preferred   Ronnie Derby 03/05/2023, 1:59 PM

## 2023-03-05 NOTE — Progress Notes (Addendum)
STROKE TEAM PROGRESS NOTE   BRIEF HPI [name] is a 82 y.o. male with history of hypertension, CAD status post CABG x 4, history of prostate cancer, with recent admission for sepsis secondary to COVID-19 infection who presented to the [facility] ED with global aphasia, found to have occluded left ICA clot with blocked left M1 segment and left basal ganglia infarct.   SIGNIFICANT HOSPITAL EVENTS 9/26: Code stroke called.  Perfusion with 13 cc infarction 57 cc mismatch.  IR activated.  Status post mechanical thrombectomy of left MCA and stenting of left ICA.  Required intubation. 9/27: Patient now extubated, conversant, moving all limbs, talking.  Loaded on Brilinta  INTERIM HISTORY/SUBJECTIVE Wife at the bedside.  Discussed further patient's desire to leave hospital for home.  Wife and patient appeared to be under the impression that they were clear for home health.  Discussed PT/OT recs that he be transferred upstairs for acute rehab.  Patient is adamant on returning home, and confident in his ability to walk independently.  Neurological exam unchanged from yesterday.  Aphasia is somewhat improved.  OBJECTIVE  CBC    Component Value Date/Time   WBC 10.4 03/03/2023 0721   RBC 3.92 (L) 03/03/2023 0721   HGB 12.7 (L) 03/03/2023 0721   HCT 38.3 (L) 03/03/2023 0721   PLT 306 03/03/2023 0721   MCV 97.7 03/03/2023 0721   MCH 32.4 03/03/2023 0721   MCHC 33.2 03/03/2023 0721   RDW 13.4 03/03/2023 0721   LYMPHSABS 1.1 03/02/2023 1041   MONOABS 0.8 03/02/2023 1041   EOSABS 0.0 03/02/2023 1041   BASOSABS 0.0 03/02/2023 1041    BMET    Component Value Date/Time   NA 140 03/04/2023 0805   K 3.9 03/04/2023 0805   CL 109 03/04/2023 0805   CO2 25 03/04/2023 0805   GLUCOSE 91 03/04/2023 0805   BUN 22 03/04/2023 0805   CREATININE 0.90 03/04/2023 0805   CREATININE 0.91 04/06/2020 0851   CALCIUM 8.1 (L) 03/04/2023 0805   GFRNONAA >60 03/04/2023 0805   GFRNONAA 81 04/06/2020 0851     IMAGING past 24 hours No results found.  Vitals:   03/05/23 0300 03/05/23 0344 03/05/23 0758 03/05/23 1030  BP: (!) 141/67  (!) 155/60 (!) 143/65  Pulse:   90 70  Resp:   19 17  Temp:   98.3 F (36.8 C) 97.8 F (36.6 C)  TempSrc:  Oral Oral Oral  SpO2: 93%  97% 94%  Weight:         PHYSICAL EXAM  Temp:  [97.8 F (36.6 C)-98.5 F (36.9 C)] 97.8 F (36.6 C) (09/29 1030) Pulse Rate:  [67-90] 70 (09/29 1030) Resp:  [15-25] 17 (09/29 1030) BP: (137-173)/(59-68) 143/65 (09/29 1030) SpO2:  [92 %-97 %] 94 % (09/29 1030)  General - Well nourished, well developed, in no apparent distress.  Ophthalmologic - fundi not visualized due to noncooperation.  Cardiovascular - Regular rhythm and rate.  Mental Status -  Level of arousal and orientation to time, place, and person were intact. Language exam showed frequent paraphasic errors, but following all commands, able to name and repeat simple sentences. Has difficulty with paraphasic errors repeating complex sentences.   Cranial Nerves II - XII - II - Visual field intact OU. III, IV, VI - Extraocular movements intact. V - Facial sensation intact bilaterally. VII - Facial movement intact bilaterally. VIII - Hearing & vestibular intact bilaterally. X - Palate elevates symmetrically. XI - Chin turning & shoulder  shrug intact bilaterally. XII - Tongue protrusion intact.  Motor Strength - The patient's strength was normal in all extremities and pronator drift was absent.  Bulk was normal and fasciculations were absent.   Motor Tone - Muscle tone was assessed at the neck and appendages and was normal.  Reflexes - The patient's reflexes were symmetrical in all extremities and he had no pathological reflexes.  Sensory - Light touch, temperature/pinprick were assessed and were symmetrical.    Coordination - The patient had normal movements in the hands with no ataxia or dysmetria.  Tremor was absent.  Gait and Station -  deferred.   ASSESSMENT/[name]  Stroke - Acute left BG ischemic infarct with left ICA and M1 occlusion s/p IR with TICI3 and carotid stent, likely large vessel disease Code Stroke CT head: likely chronic infarct in the inferior left frontal lobe.  CTA head & neck: Occluded left ICA at bifurcation.  Occluded left M1 segment.  +/- Early infarct and left basal ganglia. CT perfusion: 13/70 S/p IR with TICI3 and reocclusion of carotid followed by carotid stenting MRI : 6 mm acute infarct at the junction of the left external capsule and left putamen.  Chronic cortically-based left MCA territory infarct as described.   2D Echo: LVEF 60 to 65%.  Left atrial size within normal limits. LDL 46 HgbA1c 6.6 VTE prophylaxis - SCDs aspirin 81 mg daily prior to admission, now on aspirin and Brilinta for carotid stenting. Duration per IR Therapy recommendations: HH PT but pending OT tomorrow Disposition: pending   Hypertension Home meds: Amlodipine 10, losartan 100 Stable on the high end Resume home BP meds Long term BP goal normotensive  Hyperlipidemia Home meds: Lipitor 40 mg, resumed in hospital LDL 46, goal < 70 Continue statin at discharge  Other Stroke Risk Factors Coronary artery disease status post CABG x 4 Advanced age  Other Active Problems Prostate cancer Recent COVID infection with sepsis  Hospital day # 3     ATTENDING NOTE: I reviewed above note and agree with the assessment and [name]. Pt was seen and examined.   Wife at the bedside. Pt lying in bed, neuro unchanged. On ASA and brilinta, BP improved with home BP meds resumed. PT worked with pt today and recommend HH, but OT recommended CIR yesterday and pending re-eval tomorrow. Pt wants to go home with [facility].   For detailed assessment and [name], please refer to above/below as I have made changes wherever appropriate.   Robert Plan, MD PhD Stroke Neurology 03/05/2023 4:40 PM     To contact Stroke Continuity provider,  please refer to [email or web address]. After hours, contact General Neurology

## 2023-03-05 NOTE — Progress Notes (Signed)
Patient complained of constipation. Given ordered laxative and prune juice at 6AM; awaiting results.

## 2023-03-05 NOTE — Plan of Care (Signed)
  Problem: Education: Goal: Knowledge of disease or condition will improve Outcome: Progressing   Problem: Ischemic Stroke/TIA Tissue Perfusion: Goal: Complications of ischemic stroke/TIA will be minimized Outcome: Progressing   Problem: Coping: Goal: Will verbalize positive feelings about self Outcome: Progressing   Problem: Self-Care: Goal: Ability to participate in self-care as condition permits will improve Outcome: Progressing   Problem: Tissue Perfusion: Goal: Adequacy of tissue perfusion will improve Outcome: Progressing

## 2023-03-06 ENCOUNTER — Other Ambulatory Visit (HOSPITAL_COMMUNITY): Payer: Self-pay

## 2023-03-06 ENCOUNTER — Other Ambulatory Visit: Payer: Self-pay

## 2023-03-06 DIAGNOSIS — I63232 Cerebral infarction due to unspecified occlusion or stenosis of left carotid arteries: Secondary | ICD-10-CM | POA: Diagnosis not present

## 2023-03-06 LAB — CBC
HCT: 35.1 % — ABNORMAL LOW (ref 39.0–52.0)
Hemoglobin: 11.1 g/dL — ABNORMAL LOW (ref 13.0–17.0)
MCH: 30.9 pg (ref 26.0–34.0)
MCHC: 31.6 g/dL (ref 30.0–36.0)
MCV: 97.8 fL (ref 80.0–100.0)
Platelets: 178 10*3/uL (ref 150–400)
RBC: 3.59 MIL/uL — ABNORMAL LOW (ref 4.22–5.81)
RDW: 13.4 % (ref 11.5–15.5)
WBC: 9.6 10*3/uL (ref 4.0–10.5)
nRBC: 0 % (ref 0.0–0.2)

## 2023-03-06 LAB — BASIC METABOLIC PANEL
Anion gap: 6 (ref 5–15)
BUN: 16 mg/dL (ref 8–23)
CO2: 26 mmol/L (ref 22–32)
Calcium: 7.9 mg/dL — ABNORMAL LOW (ref 8.9–10.3)
Chloride: 104 mmol/L (ref 98–111)
Creatinine, Ser: 0.75 mg/dL (ref 0.61–1.24)
GFR, Estimated: >60 mL/min (ref >60)
Glucose, Bld: 131 mg/dL — ABNORMAL HIGH (ref 70–99)
Potassium: 4.1 mmol/L (ref 3.5–5.1)
Sodium: 136 mmol/L (ref 135–145)

## 2023-03-06 LAB — GLUCOSE, CAPILLARY
Glucose-Capillary: 124 mg/dL — ABNORMAL HIGH (ref 70–99)
Glucose-Capillary: 160 mg/dL — ABNORMAL HIGH (ref 70–99)

## 2023-03-06 LAB — TRIGLYCERIDES: Triglycerides: 86 mg/dL (ref <150)

## 2023-03-06 MED ORDER — TICAGRELOR 90 MG PO TABS
90.0000 mg | ORAL_TABLET | Freq: Two times a day (BID) | ORAL | 0 refills | Status: DC
  Filled 2023-03-06: qty 180, 90d supply, fill #0
  Filled 2023-03-06: qty 60, 30d supply, fill #0
  Filled 2023-03-27 – 2023-03-28 (×2): qty 120, 60d supply, fill #1

## 2023-03-06 MED ORDER — TICAGRELOR 90 MG PO TABS
90.0000 mg | ORAL_TABLET | Freq: Two times a day (BID) | ORAL | 0 refills | Status: DC
  Filled 2023-03-06: qty 180, 90d supply, fill #0

## 2023-03-06 NOTE — Progress Notes (Signed)
DC instructions given to pt and his wife. They voiced complete understanding. Brilinta Coupon given to the wife. Walker received.

## 2023-03-06 NOTE — Progress Notes (Deleted)
Stroke Discharge Summary  Patient ID: Robert [name]   MRN: [id number]      DOB: Oct 20, 1940  Date of Admission: 03/02/2023 Date of Discharge: 03/06/2023  Attending Physician:  Stroke, Md, MD Consultant(s):     Interventional radiology   Patient's PCP:  Nelwyn Salisbury, MD  DISCHARGE PRIMARY DIAGNOSIS: Acute left ischemic infarct of left M1 secondary to occluded left ICA s/p IR thrombectomy and carotid stent  Patient Active Problem List   Diagnosis Date Noted   Stroke (cerebrum) (HCC) 03/02/2023   Sepsis (HCC) 02/27/2023   Abrasion, knee, left, initial encounter 01/29/2019   Unilateral primary osteoarthritis, right knee 08/29/2017   Type 2 diabetes mellitus with diabetic neuropathy, unspecified (HCC) 03/10/2017   Neuropathy, diabetic (HCC) 03/10/2017   Malignant neoplasm of prostate (HCC) 07/07/2016   OBESITY 01/08/2009   EDEMA 01/08/2009   ERECTILE DYSFUNCTION 04/24/2008   BPH with urinary obstruction 04/24/2008   Hyperlipidemia 04/13/2007   Essential hypertension 04/13/2007   Coronary atherosclerosis 04/13/2007     Secondary Diagnoses: Hypertension Hyperlipidemia Advanced age Prostate cancer  Allergies as of 03/06/2023   No Known Allergies      Medication List     STOP taking these medications    dexamethasone 4 MG tablet Commonly known as: DECADRON   doxycycline 100 MG tablet Commonly known as: VIBRA-TABS       TAKE these medications    albuterol 108 (90 Base) MCG/ACT inhaler Commonly known as: VENTOLIN HFA Inhale 2 puffs into the lungs every 6 (six) hours as needed for wheezing or shortness of breath.   amLODipine 10 MG tablet Commonly known as: NORVASC Take 1 tablet (10 mg total) by mouth daily.   aspirin 81 MG tablet Take 81 mg by mouth daily.   atorvastatin 40 MG tablet Commonly known as: LIPITOR Take 1 tablet (40 mg total) by mouth daily.   latanoprost 0.005 % ophthalmic solution Commonly known as: XALATAN Place 1 drop into both  eyes at bedtime.   losartan 100 MG tablet Commonly known as: COZAAR Take 1 (one) tablet daily (Take 1 tablet (100 mg total) by mouth daily.)   meloxicam 15 MG tablet Commonly known as: MOBIC Take 1 (one) tablet daily (Take 1 tablet (15 mg total) by mouth daily.)   potassium chloride 10 MEQ tablet Commonly known as: KLOR-CON Take 1 tablet (10 mEq total) by mouth daily.   tamsulosin 0.4 MG Caps capsule Commonly known as: FLOMAX Take 0.4 mg by mouth daily.   ticagrelor 90 MG Tabs tablet Commonly known as: BRILINTA Take 1 tablet (90 mg total) by mouth 2 (two) times daily.               Durable Medical Equipment  (From admission, onward)           Start     Ordered   03/06/23 1114  For home use only DME Walker rolling  Once       Question Answer Comment  Walker: With 5 Inch Wheels   Patient needs a walker to treat with the following condition Stroke (HCC)      03/06/23 1113            LABORATORY STUDIES CBC    Component Value Date/Time   WBC 9.6 03/06/2023 0657   RBC 3.59 (L) 03/06/2023 0657   HGB 11.1 (L) 03/06/2023 0657   HCT 35.1 (L) 03/06/2023 0657   PLT 178 03/06/2023 0657   MCV 97.8 03/06/2023 0657  MCH 30.9 03/06/2023 0657   MCHC 31.6 03/06/2023 0657   RDW 13.4 03/06/2023 0657   LYMPHSABS 1.1 03/02/2023 1041   MONOABS 0.8 03/02/2023 1041   EOSABS 0.0 03/02/2023 1041   BASOSABS 0.0 03/02/2023 1041   CMP    Component Value Date/Time   NA 136 03/06/2023 0657   K 4.1 03/06/2023 0657   CL 104 03/06/2023 0657   CO2 26 03/06/2023 0657   GLUCOSE 131 (H) 03/06/2023 0657   BUN 16 03/06/2023 0657   CREATININE 0.75 03/06/2023 0657   CREATININE 0.91 04/06/2020 0851   CALCIUM 7.9 (L) 03/06/2023 0657   PROT 5.3 (L) 03/02/2023 1215   ALBUMIN 2.6 (L) 03/02/2023 1215   AST 27 03/02/2023 1215   ALT 32 03/02/2023 1215   ALKPHOS 63 03/02/2023 1215   BILITOT 0.5 03/02/2023 1215   GFRNONAA >60 03/06/2023 0657   GFRNONAA 81 04/06/2020 0851   GFRAA  94 04/06/2020 0851   COAGS Lab Results  Component Value Date   INR 1.1 03/02/2023   INR 1.0 02/28/2023   INR 1.1 02/27/2023   Lipid Panel    Component Value Date/Time   CHOL 114 03/03/2023 0722   TRIG 86 03/06/2023 0657   HDL 17 (L) 03/03/2023 0722   CHOLHDL 6.7 03/03/2023 0722   VLDL 51 (H) 03/03/2023 0722   LDLCALC 46 03/03/2023 0722   LDLCALC 72 04/06/2020 0851   HgbA1C  Lab Results  Component Value Date   HGBA1C 6.6 (H) 03/03/2023   Alcohol Level    Component Value Date/Time   ETH <10 03/02/2023 1215     SIGNIFICANT DIAGNOSTIC STUDIES ECHOCARDIOGRAM COMPLETE  Result Date: 03/03/2023    ECHOCARDIOGRAM REPORT   Patient Name:   Robert [name] Date of Exam: 03/03/2023 Medical Rec #:  [id number]         Height:       66.0 in Accession #:    [id number]        Weight:       230.2 lb Date of Birth:  04-Jun-1941         BSA:          2.123 m Patient Age:    82 years          BP:           149/57 mmHg Patient Gender: M                 HR:           53 bpm. Exam Location:  Inpatient Procedure: 2D Echo, Cardiac Doppler and Color Doppler Indications:    Stroke I63.9  History:        Patient has no prior history of Echocardiogram examinations.                 CAD, Prior CABG, Stroke; Risk Factors:Hypertension, Diabetes and                 Dyslipidemia.  Sonographer:    Lucendia Herrlich RCS Referring Phys: [id number] DENISE A WOLFE IMPRESSIONS  1. Left ventricular ejection fraction, by estimation, is 60 to 65%. The left ventricle has normal function. The left ventricle has no regional wall motion abnormalities. Left ventricular diastolic parameters are consistent with Grade I diastolic dysfunction (impaired relaxation).  2. Right ventricular systolic function is normal. The right ventricular size is normal. There is normal pulmonary artery systolic pressure.  3. The mitral valve is normal in structure. Trivial mitral valve regurgitation. No evidence  MCH 30.9 03/06/2023 0657   MCHC 31.6 03/06/2023 0657   RDW 13.4 03/06/2023 0657   LYMPHSABS 1.1 03/02/2023 1041   MONOABS 0.8 03/02/2023 1041   EOSABS 0.0 03/02/2023 1041   BASOSABS 0.0 03/02/2023 1041   CMP    Component Value Date/Time   NA 136 03/06/2023 0657   K 4.1 03/06/2023 0657   CL 104 03/06/2023 0657   CO2 26 03/06/2023 0657   GLUCOSE 131 (H) 03/06/2023 0657   BUN 16 03/06/2023 0657   CREATININE 0.75 03/06/2023 0657   CREATININE 0.91 04/06/2020 0851   CALCIUM 7.9 (L) 03/06/2023 0657   PROT 5.3 (L) 03/02/2023 1215   ALBUMIN 2.6 (L) 03/02/2023 1215   AST 27 03/02/2023 1215   ALT 32 03/02/2023 1215   ALKPHOS 63 03/02/2023 1215   BILITOT 0.5 03/02/2023 1215   GFRNONAA >60 03/06/2023 0657   GFRNONAA 81 04/06/2020 0851   GFRAA  94 04/06/2020 0851   COAGS Lab Results  Component Value Date   INR 1.1 03/02/2023   INR 1.0 02/28/2023   INR 1.1 02/27/2023   Lipid Panel    Component Value Date/Time   CHOL 114 03/03/2023 0722   TRIG 86 03/06/2023 0657   HDL 17 (L) 03/03/2023 0722   CHOLHDL 6.7 03/03/2023 0722   VLDL 51 (H) 03/03/2023 0722   LDLCALC 46 03/03/2023 0722   LDLCALC 72 04/06/2020 0851   HgbA1C  Lab Results  Component Value Date   HGBA1C 6.6 (H) 03/03/2023   Alcohol Level    Component Value Date/Time   ETH <10 03/02/2023 1215     SIGNIFICANT DIAGNOSTIC STUDIES ECHOCARDIOGRAM COMPLETE  Result Date: 03/03/2023    ECHOCARDIOGRAM REPORT   Patient Name:   Robert [name] Date of Exam: 03/03/2023 Medical Rec #:  [id number]         Height:       66.0 in Accession #:    [id number]        Weight:       230.2 lb Date of Birth:  04-Jun-1941         BSA:          2.123 m Patient Age:    82 years          BP:           149/57 mmHg Patient Gender: M                 HR:           53 bpm. Exam Location:  Inpatient Procedure: 2D Echo, Cardiac Doppler and Color Doppler Indications:    Stroke I63.9  History:        Patient has no prior history of Echocardiogram examinations.                 CAD, Prior CABG, Stroke; Risk Factors:Hypertension, Diabetes and                 Dyslipidemia.  Sonographer:    Lucendia Herrlich RCS Referring Phys: [id number] DENISE A WOLFE IMPRESSIONS  1. Left ventricular ejection fraction, by estimation, is 60 to 65%. The left ventricle has normal function. The left ventricle has no regional wall motion abnormalities. Left ventricular diastolic parameters are consistent with Grade I diastolic dysfunction (impaired relaxation).  2. Right ventricular systolic function is normal. The right ventricular size is normal. There is normal pulmonary artery systolic pressure.  3. The mitral valve is normal in structure. Trivial mitral valve regurgitation. No evidence  Stroke Discharge Summary  Patient ID: Robert [name]   MRN: [id number]      DOB: Oct 20, 1940  Date of Admission: 03/02/2023 Date of Discharge: 03/06/2023  Attending Physician:  Stroke, Md, MD Consultant(s):     Interventional radiology   Patient's PCP:  Nelwyn Salisbury, MD  DISCHARGE PRIMARY DIAGNOSIS: Acute left ischemic infarct of left M1 secondary to occluded left ICA s/p IR thrombectomy and carotid stent  Patient Active Problem List   Diagnosis Date Noted   Stroke (cerebrum) (HCC) 03/02/2023   Sepsis (HCC) 02/27/2023   Abrasion, knee, left, initial encounter 01/29/2019   Unilateral primary osteoarthritis, right knee 08/29/2017   Type 2 diabetes mellitus with diabetic neuropathy, unspecified (HCC) 03/10/2017   Neuropathy, diabetic (HCC) 03/10/2017   Malignant neoplasm of prostate (HCC) 07/07/2016   OBESITY 01/08/2009   EDEMA 01/08/2009   ERECTILE DYSFUNCTION 04/24/2008   BPH with urinary obstruction 04/24/2008   Hyperlipidemia 04/13/2007   Essential hypertension 04/13/2007   Coronary atherosclerosis 04/13/2007     Secondary Diagnoses: Hypertension Hyperlipidemia Advanced age Prostate cancer  Allergies as of 03/06/2023   No Known Allergies      Medication List     STOP taking these medications    dexamethasone 4 MG tablet Commonly known as: DECADRON   doxycycline 100 MG tablet Commonly known as: VIBRA-TABS       TAKE these medications    albuterol 108 (90 Base) MCG/ACT inhaler Commonly known as: VENTOLIN HFA Inhale 2 puffs into the lungs every 6 (six) hours as needed for wheezing or shortness of breath.   amLODipine 10 MG tablet Commonly known as: NORVASC Take 1 tablet (10 mg total) by mouth daily.   aspirin 81 MG tablet Take 81 mg by mouth daily.   atorvastatin 40 MG tablet Commonly known as: LIPITOR Take 1 tablet (40 mg total) by mouth daily.   latanoprost 0.005 % ophthalmic solution Commonly known as: XALATAN Place 1 drop into both  eyes at bedtime.   losartan 100 MG tablet Commonly known as: COZAAR Take 1 (one) tablet daily (Take 1 tablet (100 mg total) by mouth daily.)   meloxicam 15 MG tablet Commonly known as: MOBIC Take 1 (one) tablet daily (Take 1 tablet (15 mg total) by mouth daily.)   potassium chloride 10 MEQ tablet Commonly known as: KLOR-CON Take 1 tablet (10 mEq total) by mouth daily.   tamsulosin 0.4 MG Caps capsule Commonly known as: FLOMAX Take 0.4 mg by mouth daily.   ticagrelor 90 MG Tabs tablet Commonly known as: BRILINTA Take 1 tablet (90 mg total) by mouth 2 (two) times daily.               Durable Medical Equipment  (From admission, onward)           Start     Ordered   03/06/23 1114  For home use only DME Walker rolling  Once       Question Answer Comment  Walker: With 5 Inch Wheels   Patient needs a walker to treat with the following condition Stroke (HCC)      03/06/23 1113            LABORATORY STUDIES CBC    Component Value Date/Time   WBC 9.6 03/06/2023 0657   RBC 3.59 (L) 03/06/2023 0657   HGB 11.1 (L) 03/06/2023 0657   HCT 35.1 (L) 03/06/2023 0657   PLT 178 03/06/2023 0657   MCV 97.8 03/06/2023 0657  Stroke Discharge Summary  Patient ID: Robert [name]   MRN: [id number]      DOB: Oct 20, 1940  Date of Admission: 03/02/2023 Date of Discharge: 03/06/2023  Attending Physician:  Stroke, Md, MD Consultant(s):     Interventional radiology   Patient's PCP:  Nelwyn Salisbury, MD  DISCHARGE PRIMARY DIAGNOSIS: Acute left ischemic infarct of left M1 secondary to occluded left ICA s/p IR thrombectomy and carotid stent  Patient Active Problem List   Diagnosis Date Noted   Stroke (cerebrum) (HCC) 03/02/2023   Sepsis (HCC) 02/27/2023   Abrasion, knee, left, initial encounter 01/29/2019   Unilateral primary osteoarthritis, right knee 08/29/2017   Type 2 diabetes mellitus with diabetic neuropathy, unspecified (HCC) 03/10/2017   Neuropathy, diabetic (HCC) 03/10/2017   Malignant neoplasm of prostate (HCC) 07/07/2016   OBESITY 01/08/2009   EDEMA 01/08/2009   ERECTILE DYSFUNCTION 04/24/2008   BPH with urinary obstruction 04/24/2008   Hyperlipidemia 04/13/2007   Essential hypertension 04/13/2007   Coronary atherosclerosis 04/13/2007     Secondary Diagnoses: Hypertension Hyperlipidemia Advanced age Prostate cancer  Allergies as of 03/06/2023   No Known Allergies      Medication List     STOP taking these medications    dexamethasone 4 MG tablet Commonly known as: DECADRON   doxycycline 100 MG tablet Commonly known as: VIBRA-TABS       TAKE these medications    albuterol 108 (90 Base) MCG/ACT inhaler Commonly known as: VENTOLIN HFA Inhale 2 puffs into the lungs every 6 (six) hours as needed for wheezing or shortness of breath.   amLODipine 10 MG tablet Commonly known as: NORVASC Take 1 tablet (10 mg total) by mouth daily.   aspirin 81 MG tablet Take 81 mg by mouth daily.   atorvastatin 40 MG tablet Commonly known as: LIPITOR Take 1 tablet (40 mg total) by mouth daily.   latanoprost 0.005 % ophthalmic solution Commonly known as: XALATAN Place 1 drop into both  eyes at bedtime.   losartan 100 MG tablet Commonly known as: COZAAR Take 1 (one) tablet daily (Take 1 tablet (100 mg total) by mouth daily.)   meloxicam 15 MG tablet Commonly known as: MOBIC Take 1 (one) tablet daily (Take 1 tablet (15 mg total) by mouth daily.)   potassium chloride 10 MEQ tablet Commonly known as: KLOR-CON Take 1 tablet (10 mEq total) by mouth daily.   tamsulosin 0.4 MG Caps capsule Commonly known as: FLOMAX Take 0.4 mg by mouth daily.   ticagrelor 90 MG Tabs tablet Commonly known as: BRILINTA Take 1 tablet (90 mg total) by mouth 2 (two) times daily.               Durable Medical Equipment  (From admission, onward)           Start     Ordered   03/06/23 1114  For home use only DME Walker rolling  Once       Question Answer Comment  Walker: With 5 Inch Wheels   Patient needs a walker to treat with the following condition Stroke (HCC)      03/06/23 1113            LABORATORY STUDIES CBC    Component Value Date/Time   WBC 9.6 03/06/2023 0657   RBC 3.59 (L) 03/06/2023 0657   HGB 11.1 (L) 03/06/2023 0657   HCT 35.1 (L) 03/06/2023 0657   PLT 178 03/06/2023 0657   MCV 97.8 03/06/2023 0657  MCH 30.9 03/06/2023 0657   MCHC 31.6 03/06/2023 0657   RDW 13.4 03/06/2023 0657   LYMPHSABS 1.1 03/02/2023 1041   MONOABS 0.8 03/02/2023 1041   EOSABS 0.0 03/02/2023 1041   BASOSABS 0.0 03/02/2023 1041   CMP    Component Value Date/Time   NA 136 03/06/2023 0657   K 4.1 03/06/2023 0657   CL 104 03/06/2023 0657   CO2 26 03/06/2023 0657   GLUCOSE 131 (H) 03/06/2023 0657   BUN 16 03/06/2023 0657   CREATININE 0.75 03/06/2023 0657   CREATININE 0.91 04/06/2020 0851   CALCIUM 7.9 (L) 03/06/2023 0657   PROT 5.3 (L) 03/02/2023 1215   ALBUMIN 2.6 (L) 03/02/2023 1215   AST 27 03/02/2023 1215   ALT 32 03/02/2023 1215   ALKPHOS 63 03/02/2023 1215   BILITOT 0.5 03/02/2023 1215   GFRNONAA >60 03/06/2023 0657   GFRNONAA 81 04/06/2020 0851   GFRAA  94 04/06/2020 0851   COAGS Lab Results  Component Value Date   INR 1.1 03/02/2023   INR 1.0 02/28/2023   INR 1.1 02/27/2023   Lipid Panel    Component Value Date/Time   CHOL 114 03/03/2023 0722   TRIG 86 03/06/2023 0657   HDL 17 (L) 03/03/2023 0722   CHOLHDL 6.7 03/03/2023 0722   VLDL 51 (H) 03/03/2023 0722   LDLCALC 46 03/03/2023 0722   LDLCALC 72 04/06/2020 0851   HgbA1C  Lab Results  Component Value Date   HGBA1C 6.6 (H) 03/03/2023   Alcohol Level    Component Value Date/Time   ETH <10 03/02/2023 1215     SIGNIFICANT DIAGNOSTIC STUDIES ECHOCARDIOGRAM COMPLETE  Result Date: 03/03/2023    ECHOCARDIOGRAM REPORT   Patient Name:   Robert [name] Date of Exam: 03/03/2023 Medical Rec #:  [id number]         Height:       66.0 in Accession #:    [id number]        Weight:       230.2 lb Date of Birth:  04-Jun-1941         BSA:          2.123 m Patient Age:    82 years          BP:           149/57 mmHg Patient Gender: M                 HR:           53 bpm. Exam Location:  Inpatient Procedure: 2D Echo, Cardiac Doppler and Color Doppler Indications:    Stroke I63.9  History:        Patient has no prior history of Echocardiogram examinations.                 CAD, Prior CABG, Stroke; Risk Factors:Hypertension, Diabetes and                 Dyslipidemia.  Sonographer:    Lucendia Herrlich RCS Referring Phys: [id number] DENISE A WOLFE IMPRESSIONS  1. Left ventricular ejection fraction, by estimation, is 60 to 65%. The left ventricle has normal function. The left ventricle has no regional wall motion abnormalities. Left ventricular diastolic parameters are consistent with Grade I diastolic dysfunction (impaired relaxation).  2. Right ventricular systolic function is normal. The right ventricular size is normal. There is normal pulmonary artery systolic pressure.  3. The mitral valve is normal in structure. Trivial mitral valve regurgitation. No evidence  MCH 30.9 03/06/2023 0657   MCHC 31.6 03/06/2023 0657   RDW 13.4 03/06/2023 0657   LYMPHSABS 1.1 03/02/2023 1041   MONOABS 0.8 03/02/2023 1041   EOSABS 0.0 03/02/2023 1041   BASOSABS 0.0 03/02/2023 1041   CMP    Component Value Date/Time   NA 136 03/06/2023 0657   K 4.1 03/06/2023 0657   CL 104 03/06/2023 0657   CO2 26 03/06/2023 0657   GLUCOSE 131 (H) 03/06/2023 0657   BUN 16 03/06/2023 0657   CREATININE 0.75 03/06/2023 0657   CREATININE 0.91 04/06/2020 0851   CALCIUM 7.9 (L) 03/06/2023 0657   PROT 5.3 (L) 03/02/2023 1215   ALBUMIN 2.6 (L) 03/02/2023 1215   AST 27 03/02/2023 1215   ALT 32 03/02/2023 1215   ALKPHOS 63 03/02/2023 1215   BILITOT 0.5 03/02/2023 1215   GFRNONAA >60 03/06/2023 0657   GFRNONAA 81 04/06/2020 0851   GFRAA  94 04/06/2020 0851   COAGS Lab Results  Component Value Date   INR 1.1 03/02/2023   INR 1.0 02/28/2023   INR 1.1 02/27/2023   Lipid Panel    Component Value Date/Time   CHOL 114 03/03/2023 0722   TRIG 86 03/06/2023 0657   HDL 17 (L) 03/03/2023 0722   CHOLHDL 6.7 03/03/2023 0722   VLDL 51 (H) 03/03/2023 0722   LDLCALC 46 03/03/2023 0722   LDLCALC 72 04/06/2020 0851   HgbA1C  Lab Results  Component Value Date   HGBA1C 6.6 (H) 03/03/2023   Alcohol Level    Component Value Date/Time   ETH <10 03/02/2023 1215     SIGNIFICANT DIAGNOSTIC STUDIES ECHOCARDIOGRAM COMPLETE  Result Date: 03/03/2023    ECHOCARDIOGRAM REPORT   Patient Name:   Robert [name] Date of Exam: 03/03/2023 Medical Rec #:  [id number]         Height:       66.0 in Accession #:    [id number]        Weight:       230.2 lb Date of Birth:  04-Jun-1941         BSA:          2.123 m Patient Age:    82 years          BP:           149/57 mmHg Patient Gender: M                 HR:           53 bpm. Exam Location:  Inpatient Procedure: 2D Echo, Cardiac Doppler and Color Doppler Indications:    Stroke I63.9  History:        Patient has no prior history of Echocardiogram examinations.                 CAD, Prior CABG, Stroke; Risk Factors:Hypertension, Diabetes and                 Dyslipidemia.  Sonographer:    Lucendia Herrlich RCS Referring Phys: [id number] DENISE A WOLFE IMPRESSIONS  1. Left ventricular ejection fraction, by estimation, is 60 to 65%. The left ventricle has normal function. The left ventricle has no regional wall motion abnormalities. Left ventricular diastolic parameters are consistent with Grade I diastolic dysfunction (impaired relaxation).  2. Right ventricular systolic function is normal. The right ventricular size is normal. There is normal pulmonary artery systolic pressure.  3. The mitral valve is normal in structure. Trivial mitral valve regurgitation. No evidence  MCH 30.9 03/06/2023 0657   MCHC 31.6 03/06/2023 0657   RDW 13.4 03/06/2023 0657   LYMPHSABS 1.1 03/02/2023 1041   MONOABS 0.8 03/02/2023 1041   EOSABS 0.0 03/02/2023 1041   BASOSABS 0.0 03/02/2023 1041   CMP    Component Value Date/Time   NA 136 03/06/2023 0657   K 4.1 03/06/2023 0657   CL 104 03/06/2023 0657   CO2 26 03/06/2023 0657   GLUCOSE 131 (H) 03/06/2023 0657   BUN 16 03/06/2023 0657   CREATININE 0.75 03/06/2023 0657   CREATININE 0.91 04/06/2020 0851   CALCIUM 7.9 (L) 03/06/2023 0657   PROT 5.3 (L) 03/02/2023 1215   ALBUMIN 2.6 (L) 03/02/2023 1215   AST 27 03/02/2023 1215   ALT 32 03/02/2023 1215   ALKPHOS 63 03/02/2023 1215   BILITOT 0.5 03/02/2023 1215   GFRNONAA >60 03/06/2023 0657   GFRNONAA 81 04/06/2020 0851   GFRAA  94 04/06/2020 0851   COAGS Lab Results  Component Value Date   INR 1.1 03/02/2023   INR 1.0 02/28/2023   INR 1.1 02/27/2023   Lipid Panel    Component Value Date/Time   CHOL 114 03/03/2023 0722   TRIG 86 03/06/2023 0657   HDL 17 (L) 03/03/2023 0722   CHOLHDL 6.7 03/03/2023 0722   VLDL 51 (H) 03/03/2023 0722   LDLCALC 46 03/03/2023 0722   LDLCALC 72 04/06/2020 0851   HgbA1C  Lab Results  Component Value Date   HGBA1C 6.6 (H) 03/03/2023   Alcohol Level    Component Value Date/Time   ETH <10 03/02/2023 1215     SIGNIFICANT DIAGNOSTIC STUDIES ECHOCARDIOGRAM COMPLETE  Result Date: 03/03/2023    ECHOCARDIOGRAM REPORT   Patient Name:   Robert [name] Date of Exam: 03/03/2023 Medical Rec #:  [id number]         Height:       66.0 in Accession #:    [id number]        Weight:       230.2 lb Date of Birth:  04-Jun-1941         BSA:          2.123 m Patient Age:    82 years          BP:           149/57 mmHg Patient Gender: M                 HR:           53 bpm. Exam Location:  Inpatient Procedure: 2D Echo, Cardiac Doppler and Color Doppler Indications:    Stroke I63.9  History:        Patient has no prior history of Echocardiogram examinations.                 CAD, Prior CABG, Stroke; Risk Factors:Hypertension, Diabetes and                 Dyslipidemia.  Sonographer:    Lucendia Herrlich RCS Referring Phys: [id number] DENISE A WOLFE IMPRESSIONS  1. Left ventricular ejection fraction, by estimation, is 60 to 65%. The left ventricle has normal function. The left ventricle has no regional wall motion abnormalities. Left ventricular diastolic parameters are consistent with Grade I diastolic dysfunction (impaired relaxation).  2. Right ventricular systolic function is normal. The right ventricular size is normal. There is normal pulmonary artery systolic pressure.  3. The mitral valve is normal in structure. Trivial mitral valve regurgitation. No evidence  Stroke Discharge Summary  Patient ID: Robert [name]   MRN: [id number]      DOB: Oct 20, 1940  Date of Admission: 03/02/2023 Date of Discharge: 03/06/2023  Attending Physician:  Stroke, Md, MD Consultant(s):     Interventional radiology   Patient's PCP:  Nelwyn Salisbury, MD  DISCHARGE PRIMARY DIAGNOSIS: Acute left ischemic infarct of left M1 secondary to occluded left ICA s/p IR thrombectomy and carotid stent  Patient Active Problem List   Diagnosis Date Noted   Stroke (cerebrum) (HCC) 03/02/2023   Sepsis (HCC) 02/27/2023   Abrasion, knee, left, initial encounter 01/29/2019   Unilateral primary osteoarthritis, right knee 08/29/2017   Type 2 diabetes mellitus with diabetic neuropathy, unspecified (HCC) 03/10/2017   Neuropathy, diabetic (HCC) 03/10/2017   Malignant neoplasm of prostate (HCC) 07/07/2016   OBESITY 01/08/2009   EDEMA 01/08/2009   ERECTILE DYSFUNCTION 04/24/2008   BPH with urinary obstruction 04/24/2008   Hyperlipidemia 04/13/2007   Essential hypertension 04/13/2007   Coronary atherosclerosis 04/13/2007     Secondary Diagnoses: Hypertension Hyperlipidemia Advanced age Prostate cancer  Allergies as of 03/06/2023   No Known Allergies      Medication List     STOP taking these medications    dexamethasone 4 MG tablet Commonly known as: DECADRON   doxycycline 100 MG tablet Commonly known as: VIBRA-TABS       TAKE these medications    albuterol 108 (90 Base) MCG/ACT inhaler Commonly known as: VENTOLIN HFA Inhale 2 puffs into the lungs every 6 (six) hours as needed for wheezing or shortness of breath.   amLODipine 10 MG tablet Commonly known as: NORVASC Take 1 tablet (10 mg total) by mouth daily.   aspirin 81 MG tablet Take 81 mg by mouth daily.   atorvastatin 40 MG tablet Commonly known as: LIPITOR Take 1 tablet (40 mg total) by mouth daily.   latanoprost 0.005 % ophthalmic solution Commonly known as: XALATAN Place 1 drop into both  eyes at bedtime.   losartan 100 MG tablet Commonly known as: COZAAR Take 1 (one) tablet daily (Take 1 tablet (100 mg total) by mouth daily.)   meloxicam 15 MG tablet Commonly known as: MOBIC Take 1 (one) tablet daily (Take 1 tablet (15 mg total) by mouth daily.)   potassium chloride 10 MEQ tablet Commonly known as: KLOR-CON Take 1 tablet (10 mEq total) by mouth daily.   tamsulosin 0.4 MG Caps capsule Commonly known as: FLOMAX Take 0.4 mg by mouth daily.   ticagrelor 90 MG Tabs tablet Commonly known as: BRILINTA Take 1 tablet (90 mg total) by mouth 2 (two) times daily.               Durable Medical Equipment  (From admission, onward)           Start     Ordered   03/06/23 1114  For home use only DME Walker rolling  Once       Question Answer Comment  Walker: With 5 Inch Wheels   Patient needs a walker to treat with the following condition Stroke (HCC)      03/06/23 1113            LABORATORY STUDIES CBC    Component Value Date/Time   WBC 9.6 03/06/2023 0657   RBC 3.59 (L) 03/06/2023 0657   HGB 11.1 (L) 03/06/2023 0657   HCT 35.1 (L) 03/06/2023 0657   PLT 178 03/06/2023 0657   MCV 97.8 03/06/2023 0657  MCH 30.9 03/06/2023 0657   MCHC 31.6 03/06/2023 0657   RDW 13.4 03/06/2023 0657   LYMPHSABS 1.1 03/02/2023 1041   MONOABS 0.8 03/02/2023 1041   EOSABS 0.0 03/02/2023 1041   BASOSABS 0.0 03/02/2023 1041   CMP    Component Value Date/Time   NA 136 03/06/2023 0657   K 4.1 03/06/2023 0657   CL 104 03/06/2023 0657   CO2 26 03/06/2023 0657   GLUCOSE 131 (H) 03/06/2023 0657   BUN 16 03/06/2023 0657   CREATININE 0.75 03/06/2023 0657   CREATININE 0.91 04/06/2020 0851   CALCIUM 7.9 (L) 03/06/2023 0657   PROT 5.3 (L) 03/02/2023 1215   ALBUMIN 2.6 (L) 03/02/2023 1215   AST 27 03/02/2023 1215   ALT 32 03/02/2023 1215   ALKPHOS 63 03/02/2023 1215   BILITOT 0.5 03/02/2023 1215   GFRNONAA >60 03/06/2023 0657   GFRNONAA 81 04/06/2020 0851   GFRAA  94 04/06/2020 0851   COAGS Lab Results  Component Value Date   INR 1.1 03/02/2023   INR 1.0 02/28/2023   INR 1.1 02/27/2023   Lipid Panel    Component Value Date/Time   CHOL 114 03/03/2023 0722   TRIG 86 03/06/2023 0657   HDL 17 (L) 03/03/2023 0722   CHOLHDL 6.7 03/03/2023 0722   VLDL 51 (H) 03/03/2023 0722   LDLCALC 46 03/03/2023 0722   LDLCALC 72 04/06/2020 0851   HgbA1C  Lab Results  Component Value Date   HGBA1C 6.6 (H) 03/03/2023   Alcohol Level    Component Value Date/Time   ETH <10 03/02/2023 1215     SIGNIFICANT DIAGNOSTIC STUDIES ECHOCARDIOGRAM COMPLETE  Result Date: 03/03/2023    ECHOCARDIOGRAM REPORT   Patient Name:   Robert [name] Date of Exam: 03/03/2023 Medical Rec #:  [id number]         Height:       66.0 in Accession #:    [id number]        Weight:       230.2 lb Date of Birth:  04-Jun-1941         BSA:          2.123 m Patient Age:    82 years          BP:           149/57 mmHg Patient Gender: M                 HR:           53 bpm. Exam Location:  Inpatient Procedure: 2D Echo, Cardiac Doppler and Color Doppler Indications:    Stroke I63.9  History:        Patient has no prior history of Echocardiogram examinations.                 CAD, Prior CABG, Stroke; Risk Factors:Hypertension, Diabetes and                 Dyslipidemia.  Sonographer:    Lucendia Herrlich RCS Referring Phys: [id number] DENISE A WOLFE IMPRESSIONS  1. Left ventricular ejection fraction, by estimation, is 60 to 65%. The left ventricle has normal function. The left ventricle has no regional wall motion abnormalities. Left ventricular diastolic parameters are consistent with Grade I diastolic dysfunction (impaired relaxation).  2. Right ventricular systolic function is normal. The right ventricular size is normal. There is normal pulmonary artery systolic pressure.  3. The mitral valve is normal in structure. Trivial mitral valve regurgitation. No evidence  MCH 30.9 03/06/2023 0657   MCHC 31.6 03/06/2023 0657   RDW 13.4 03/06/2023 0657   LYMPHSABS 1.1 03/02/2023 1041   MONOABS 0.8 03/02/2023 1041   EOSABS 0.0 03/02/2023 1041   BASOSABS 0.0 03/02/2023 1041   CMP    Component Value Date/Time   NA 136 03/06/2023 0657   K 4.1 03/06/2023 0657   CL 104 03/06/2023 0657   CO2 26 03/06/2023 0657   GLUCOSE 131 (H) 03/06/2023 0657   BUN 16 03/06/2023 0657   CREATININE 0.75 03/06/2023 0657   CREATININE 0.91 04/06/2020 0851   CALCIUM 7.9 (L) 03/06/2023 0657   PROT 5.3 (L) 03/02/2023 1215   ALBUMIN 2.6 (L) 03/02/2023 1215   AST 27 03/02/2023 1215   ALT 32 03/02/2023 1215   ALKPHOS 63 03/02/2023 1215   BILITOT 0.5 03/02/2023 1215   GFRNONAA >60 03/06/2023 0657   GFRNONAA 81 04/06/2020 0851   GFRAA  94 04/06/2020 0851   COAGS Lab Results  Component Value Date   INR 1.1 03/02/2023   INR 1.0 02/28/2023   INR 1.1 02/27/2023   Lipid Panel    Component Value Date/Time   CHOL 114 03/03/2023 0722   TRIG 86 03/06/2023 0657   HDL 17 (L) 03/03/2023 0722   CHOLHDL 6.7 03/03/2023 0722   VLDL 51 (H) 03/03/2023 0722   LDLCALC 46 03/03/2023 0722   LDLCALC 72 04/06/2020 0851   HgbA1C  Lab Results  Component Value Date   HGBA1C 6.6 (H) 03/03/2023   Alcohol Level    Component Value Date/Time   ETH <10 03/02/2023 1215     SIGNIFICANT DIAGNOSTIC STUDIES ECHOCARDIOGRAM COMPLETE  Result Date: 03/03/2023    ECHOCARDIOGRAM REPORT   Patient Name:   Robert [name] Date of Exam: 03/03/2023 Medical Rec #:  [id number]         Height:       66.0 in Accession #:    [id number]        Weight:       230.2 lb Date of Birth:  04-Jun-1941         BSA:          2.123 m Patient Age:    82 years          BP:           149/57 mmHg Patient Gender: M                 HR:           53 bpm. Exam Location:  Inpatient Procedure: 2D Echo, Cardiac Doppler and Color Doppler Indications:    Stroke I63.9  History:        Patient has no prior history of Echocardiogram examinations.                 CAD, Prior CABG, Stroke; Risk Factors:Hypertension, Diabetes and                 Dyslipidemia.  Sonographer:    Lucendia Herrlich RCS Referring Phys: [id number] DENISE A WOLFE IMPRESSIONS  1. Left ventricular ejection fraction, by estimation, is 60 to 65%. The left ventricle has normal function. The left ventricle has no regional wall motion abnormalities. Left ventricular diastolic parameters are consistent with Grade I diastolic dysfunction (impaired relaxation).  2. Right ventricular systolic function is normal. The right ventricular size is normal. There is normal pulmonary artery systolic pressure.  3. The mitral valve is normal in structure. Trivial mitral valve regurgitation. No evidence  MCH 30.9 03/06/2023 0657   MCHC 31.6 03/06/2023 0657   RDW 13.4 03/06/2023 0657   LYMPHSABS 1.1 03/02/2023 1041   MONOABS 0.8 03/02/2023 1041   EOSABS 0.0 03/02/2023 1041   BASOSABS 0.0 03/02/2023 1041   CMP    Component Value Date/Time   NA 136 03/06/2023 0657   K 4.1 03/06/2023 0657   CL 104 03/06/2023 0657   CO2 26 03/06/2023 0657   GLUCOSE 131 (H) 03/06/2023 0657   BUN 16 03/06/2023 0657   CREATININE 0.75 03/06/2023 0657   CREATININE 0.91 04/06/2020 0851   CALCIUM 7.9 (L) 03/06/2023 0657   PROT 5.3 (L) 03/02/2023 1215   ALBUMIN 2.6 (L) 03/02/2023 1215   AST 27 03/02/2023 1215   ALT 32 03/02/2023 1215   ALKPHOS 63 03/02/2023 1215   BILITOT 0.5 03/02/2023 1215   GFRNONAA >60 03/06/2023 0657   GFRNONAA 81 04/06/2020 0851   GFRAA  94 04/06/2020 0851   COAGS Lab Results  Component Value Date   INR 1.1 03/02/2023   INR 1.0 02/28/2023   INR 1.1 02/27/2023   Lipid Panel    Component Value Date/Time   CHOL 114 03/03/2023 0722   TRIG 86 03/06/2023 0657   HDL 17 (L) 03/03/2023 0722   CHOLHDL 6.7 03/03/2023 0722   VLDL 51 (H) 03/03/2023 0722   LDLCALC 46 03/03/2023 0722   LDLCALC 72 04/06/2020 0851   HgbA1C  Lab Results  Component Value Date   HGBA1C 6.6 (H) 03/03/2023   Alcohol Level    Component Value Date/Time   ETH <10 03/02/2023 1215     SIGNIFICANT DIAGNOSTIC STUDIES ECHOCARDIOGRAM COMPLETE  Result Date: 03/03/2023    ECHOCARDIOGRAM REPORT   Patient Name:   Robert [name] Date of Exam: 03/03/2023 Medical Rec #:  [id number]         Height:       66.0 in Accession #:    [id number]        Weight:       230.2 lb Date of Birth:  04-Jun-1941         BSA:          2.123 m Patient Age:    82 years          BP:           149/57 mmHg Patient Gender: M                 HR:           53 bpm. Exam Location:  Inpatient Procedure: 2D Echo, Cardiac Doppler and Color Doppler Indications:    Stroke I63.9  History:        Patient has no prior history of Echocardiogram examinations.                 CAD, Prior CABG, Stroke; Risk Factors:Hypertension, Diabetes and                 Dyslipidemia.  Sonographer:    Lucendia Herrlich RCS Referring Phys: [id number] DENISE A WOLFE IMPRESSIONS  1. Left ventricular ejection fraction, by estimation, is 60 to 65%. The left ventricle has normal function. The left ventricle has no regional wall motion abnormalities. Left ventricular diastolic parameters are consistent with Grade I diastolic dysfunction (impaired relaxation).  2. Right ventricular systolic function is normal. The right ventricular size is normal. There is normal pulmonary artery systolic pressure.  3. The mitral valve is normal in structure. Trivial mitral valve regurgitation. No evidence  MCH 30.9 03/06/2023 0657   MCHC 31.6 03/06/2023 0657   RDW 13.4 03/06/2023 0657   LYMPHSABS 1.1 03/02/2023 1041   MONOABS 0.8 03/02/2023 1041   EOSABS 0.0 03/02/2023 1041   BASOSABS 0.0 03/02/2023 1041   CMP    Component Value Date/Time   NA 136 03/06/2023 0657   K 4.1 03/06/2023 0657   CL 104 03/06/2023 0657   CO2 26 03/06/2023 0657   GLUCOSE 131 (H) 03/06/2023 0657   BUN 16 03/06/2023 0657   CREATININE 0.75 03/06/2023 0657   CREATININE 0.91 04/06/2020 0851   CALCIUM 7.9 (L) 03/06/2023 0657   PROT 5.3 (L) 03/02/2023 1215   ALBUMIN 2.6 (L) 03/02/2023 1215   AST 27 03/02/2023 1215   ALT 32 03/02/2023 1215   ALKPHOS 63 03/02/2023 1215   BILITOT 0.5 03/02/2023 1215   GFRNONAA >60 03/06/2023 0657   GFRNONAA 81 04/06/2020 0851   GFRAA  94 04/06/2020 0851   COAGS Lab Results  Component Value Date   INR 1.1 03/02/2023   INR 1.0 02/28/2023   INR 1.1 02/27/2023   Lipid Panel    Component Value Date/Time   CHOL 114 03/03/2023 0722   TRIG 86 03/06/2023 0657   HDL 17 (L) 03/03/2023 0722   CHOLHDL 6.7 03/03/2023 0722   VLDL 51 (H) 03/03/2023 0722   LDLCALC 46 03/03/2023 0722   LDLCALC 72 04/06/2020 0851   HgbA1C  Lab Results  Component Value Date   HGBA1C 6.6 (H) 03/03/2023   Alcohol Level    Component Value Date/Time   ETH <10 03/02/2023 1215     SIGNIFICANT DIAGNOSTIC STUDIES ECHOCARDIOGRAM COMPLETE  Result Date: 03/03/2023    ECHOCARDIOGRAM REPORT   Patient Name:   Robert [name] Date of Exam: 03/03/2023 Medical Rec #:  [id number]         Height:       66.0 in Accession #:    [id number]        Weight:       230.2 lb Date of Birth:  04-Jun-1941         BSA:          2.123 m Patient Age:    82 years          BP:           149/57 mmHg Patient Gender: M                 HR:           53 bpm. Exam Location:  Inpatient Procedure: 2D Echo, Cardiac Doppler and Color Doppler Indications:    Stroke I63.9  History:        Patient has no prior history of Echocardiogram examinations.                 CAD, Prior CABG, Stroke; Risk Factors:Hypertension, Diabetes and                 Dyslipidemia.  Sonographer:    Lucendia Herrlich RCS Referring Phys: [id number] DENISE A WOLFE IMPRESSIONS  1. Left ventricular ejection fraction, by estimation, is 60 to 65%. The left ventricle has normal function. The left ventricle has no regional wall motion abnormalities. Left ventricular diastolic parameters are consistent with Grade I diastolic dysfunction (impaired relaxation).  2. Right ventricular systolic function is normal. The right ventricular size is normal. There is normal pulmonary artery systolic pressure.  3. The mitral valve is normal in structure. Trivial mitral valve regurgitation. No evidence  Stroke Discharge Summary  Patient ID: Robert [name]   MRN: [id number]      DOB: Oct 20, 1940  Date of Admission: 03/02/2023 Date of Discharge: 03/06/2023  Attending Physician:  Stroke, Md, MD Consultant(s):     Interventional radiology   Patient's PCP:  Nelwyn Salisbury, MD  DISCHARGE PRIMARY DIAGNOSIS: Acute left ischemic infarct of left M1 secondary to occluded left ICA s/p IR thrombectomy and carotid stent  Patient Active Problem List   Diagnosis Date Noted   Stroke (cerebrum) (HCC) 03/02/2023   Sepsis (HCC) 02/27/2023   Abrasion, knee, left, initial encounter 01/29/2019   Unilateral primary osteoarthritis, right knee 08/29/2017   Type 2 diabetes mellitus with diabetic neuropathy, unspecified (HCC) 03/10/2017   Neuropathy, diabetic (HCC) 03/10/2017   Malignant neoplasm of prostate (HCC) 07/07/2016   OBESITY 01/08/2009   EDEMA 01/08/2009   ERECTILE DYSFUNCTION 04/24/2008   BPH with urinary obstruction 04/24/2008   Hyperlipidemia 04/13/2007   Essential hypertension 04/13/2007   Coronary atherosclerosis 04/13/2007     Secondary Diagnoses: Hypertension Hyperlipidemia Advanced age Prostate cancer  Allergies as of 03/06/2023   No Known Allergies      Medication List     STOP taking these medications    dexamethasone 4 MG tablet Commonly known as: DECADRON   doxycycline 100 MG tablet Commonly known as: VIBRA-TABS       TAKE these medications    albuterol 108 (90 Base) MCG/ACT inhaler Commonly known as: VENTOLIN HFA Inhale 2 puffs into the lungs every 6 (six) hours as needed for wheezing or shortness of breath.   amLODipine 10 MG tablet Commonly known as: NORVASC Take 1 tablet (10 mg total) by mouth daily.   aspirin 81 MG tablet Take 81 mg by mouth daily.   atorvastatin 40 MG tablet Commonly known as: LIPITOR Take 1 tablet (40 mg total) by mouth daily.   latanoprost 0.005 % ophthalmic solution Commonly known as: XALATAN Place 1 drop into both  eyes at bedtime.   losartan 100 MG tablet Commonly known as: COZAAR Take 1 (one) tablet daily (Take 1 tablet (100 mg total) by mouth daily.)   meloxicam 15 MG tablet Commonly known as: MOBIC Take 1 (one) tablet daily (Take 1 tablet (15 mg total) by mouth daily.)   potassium chloride 10 MEQ tablet Commonly known as: KLOR-CON Take 1 tablet (10 mEq total) by mouth daily.   tamsulosin 0.4 MG Caps capsule Commonly known as: FLOMAX Take 0.4 mg by mouth daily.   ticagrelor 90 MG Tabs tablet Commonly known as: BRILINTA Take 1 tablet (90 mg total) by mouth 2 (two) times daily.               Durable Medical Equipment  (From admission, onward)           Start     Ordered   03/06/23 1114  For home use only DME Walker rolling  Once       Question Answer Comment  Walker: With 5 Inch Wheels   Patient needs a walker to treat with the following condition Stroke (HCC)      03/06/23 1113            LABORATORY STUDIES CBC    Component Value Date/Time   WBC 9.6 03/06/2023 0657   RBC 3.59 (L) 03/06/2023 0657   HGB 11.1 (L) 03/06/2023 0657   HCT 35.1 (L) 03/06/2023 0657   PLT 178 03/06/2023 0657   MCV 97.8 03/06/2023 0657  MCH 30.9 03/06/2023 0657   MCHC 31.6 03/06/2023 0657   RDW 13.4 03/06/2023 0657   LYMPHSABS 1.1 03/02/2023 1041   MONOABS 0.8 03/02/2023 1041   EOSABS 0.0 03/02/2023 1041   BASOSABS 0.0 03/02/2023 1041   CMP    Component Value Date/Time   NA 136 03/06/2023 0657   K 4.1 03/06/2023 0657   CL 104 03/06/2023 0657   CO2 26 03/06/2023 0657   GLUCOSE 131 (H) 03/06/2023 0657   BUN 16 03/06/2023 0657   CREATININE 0.75 03/06/2023 0657   CREATININE 0.91 04/06/2020 0851   CALCIUM 7.9 (L) 03/06/2023 0657   PROT 5.3 (L) 03/02/2023 1215   ALBUMIN 2.6 (L) 03/02/2023 1215   AST 27 03/02/2023 1215   ALT 32 03/02/2023 1215   ALKPHOS 63 03/02/2023 1215   BILITOT 0.5 03/02/2023 1215   GFRNONAA >60 03/06/2023 0657   GFRNONAA 81 04/06/2020 0851   GFRAA  94 04/06/2020 0851   COAGS Lab Results  Component Value Date   INR 1.1 03/02/2023   INR 1.0 02/28/2023   INR 1.1 02/27/2023   Lipid Panel    Component Value Date/Time   CHOL 114 03/03/2023 0722   TRIG 86 03/06/2023 0657   HDL 17 (L) 03/03/2023 0722   CHOLHDL 6.7 03/03/2023 0722   VLDL 51 (H) 03/03/2023 0722   LDLCALC 46 03/03/2023 0722   LDLCALC 72 04/06/2020 0851   HgbA1C  Lab Results  Component Value Date   HGBA1C 6.6 (H) 03/03/2023   Alcohol Level    Component Value Date/Time   ETH <10 03/02/2023 1215     SIGNIFICANT DIAGNOSTIC STUDIES ECHOCARDIOGRAM COMPLETE  Result Date: 03/03/2023    ECHOCARDIOGRAM REPORT   Patient Name:   Robert [name] Date of Exam: 03/03/2023 Medical Rec #:  [id number]         Height:       66.0 in Accession #:    [id number]        Weight:       230.2 lb Date of Birth:  04-Jun-1941         BSA:          2.123 m Patient Age:    82 years          BP:           149/57 mmHg Patient Gender: M                 HR:           53 bpm. Exam Location:  Inpatient Procedure: 2D Echo, Cardiac Doppler and Color Doppler Indications:    Stroke I63.9  History:        Patient has no prior history of Echocardiogram examinations.                 CAD, Prior CABG, Stroke; Risk Factors:Hypertension, Diabetes and                 Dyslipidemia.  Sonographer:    Lucendia Herrlich RCS Referring Phys: [id number] DENISE A WOLFE IMPRESSIONS  1. Left ventricular ejection fraction, by estimation, is 60 to 65%. The left ventricle has normal function. The left ventricle has no regional wall motion abnormalities. Left ventricular diastolic parameters are consistent with Grade I diastolic dysfunction (impaired relaxation).  2. Right ventricular systolic function is normal. The right ventricular size is normal. There is normal pulmonary artery systolic pressure.  3. The mitral valve is normal in structure. Trivial mitral valve regurgitation. No evidence  Stroke Discharge Summary  Patient ID: Robert [name]   MRN: [id number]      DOB: Oct 20, 1940  Date of Admission: 03/02/2023 Date of Discharge: 03/06/2023  Attending Physician:  Stroke, Md, MD Consultant(s):     Interventional radiology   Patient's PCP:  Nelwyn Salisbury, MD  DISCHARGE PRIMARY DIAGNOSIS: Acute left ischemic infarct of left M1 secondary to occluded left ICA s/p IR thrombectomy and carotid stent  Patient Active Problem List   Diagnosis Date Noted   Stroke (cerebrum) (HCC) 03/02/2023   Sepsis (HCC) 02/27/2023   Abrasion, knee, left, initial encounter 01/29/2019   Unilateral primary osteoarthritis, right knee 08/29/2017   Type 2 diabetes mellitus with diabetic neuropathy, unspecified (HCC) 03/10/2017   Neuropathy, diabetic (HCC) 03/10/2017   Malignant neoplasm of prostate (HCC) 07/07/2016   OBESITY 01/08/2009   EDEMA 01/08/2009   ERECTILE DYSFUNCTION 04/24/2008   BPH with urinary obstruction 04/24/2008   Hyperlipidemia 04/13/2007   Essential hypertension 04/13/2007   Coronary atherosclerosis 04/13/2007     Secondary Diagnoses: Hypertension Hyperlipidemia Advanced age Prostate cancer  Allergies as of 03/06/2023   No Known Allergies      Medication List     STOP taking these medications    dexamethasone 4 MG tablet Commonly known as: DECADRON   doxycycline 100 MG tablet Commonly known as: VIBRA-TABS       TAKE these medications    albuterol 108 (90 Base) MCG/ACT inhaler Commonly known as: VENTOLIN HFA Inhale 2 puffs into the lungs every 6 (six) hours as needed for wheezing or shortness of breath.   amLODipine 10 MG tablet Commonly known as: NORVASC Take 1 tablet (10 mg total) by mouth daily.   aspirin 81 MG tablet Take 81 mg by mouth daily.   atorvastatin 40 MG tablet Commonly known as: LIPITOR Take 1 tablet (40 mg total) by mouth daily.   latanoprost 0.005 % ophthalmic solution Commonly known as: XALATAN Place 1 drop into both  eyes at bedtime.   losartan 100 MG tablet Commonly known as: COZAAR Take 1 (one) tablet daily (Take 1 tablet (100 mg total) by mouth daily.)   meloxicam 15 MG tablet Commonly known as: MOBIC Take 1 (one) tablet daily (Take 1 tablet (15 mg total) by mouth daily.)   potassium chloride 10 MEQ tablet Commonly known as: KLOR-CON Take 1 tablet (10 mEq total) by mouth daily.   tamsulosin 0.4 MG Caps capsule Commonly known as: FLOMAX Take 0.4 mg by mouth daily.   ticagrelor 90 MG Tabs tablet Commonly known as: BRILINTA Take 1 tablet (90 mg total) by mouth 2 (two) times daily.               Durable Medical Equipment  (From admission, onward)           Start     Ordered   03/06/23 1114  For home use only DME Walker rolling  Once       Question Answer Comment  Walker: With 5 Inch Wheels   Patient needs a walker to treat with the following condition Stroke (HCC)      03/06/23 1113            LABORATORY STUDIES CBC    Component Value Date/Time   WBC 9.6 03/06/2023 0657   RBC 3.59 (L) 03/06/2023 0657   HGB 11.1 (L) 03/06/2023 0657   HCT 35.1 (L) 03/06/2023 0657   PLT 178 03/06/2023 0657   MCV 97.8 03/06/2023 0657  MCH 30.9 03/06/2023 0657   MCHC 31.6 03/06/2023 0657   RDW 13.4 03/06/2023 0657   LYMPHSABS 1.1 03/02/2023 1041   MONOABS 0.8 03/02/2023 1041   EOSABS 0.0 03/02/2023 1041   BASOSABS 0.0 03/02/2023 1041   CMP    Component Value Date/Time   NA 136 03/06/2023 0657   K 4.1 03/06/2023 0657   CL 104 03/06/2023 0657   CO2 26 03/06/2023 0657   GLUCOSE 131 (H) 03/06/2023 0657   BUN 16 03/06/2023 0657   CREATININE 0.75 03/06/2023 0657   CREATININE 0.91 04/06/2020 0851   CALCIUM 7.9 (L) 03/06/2023 0657   PROT 5.3 (L) 03/02/2023 1215   ALBUMIN 2.6 (L) 03/02/2023 1215   AST 27 03/02/2023 1215   ALT 32 03/02/2023 1215   ALKPHOS 63 03/02/2023 1215   BILITOT 0.5 03/02/2023 1215   GFRNONAA >60 03/06/2023 0657   GFRNONAA 81 04/06/2020 0851   GFRAA  94 04/06/2020 0851   COAGS Lab Results  Component Value Date   INR 1.1 03/02/2023   INR 1.0 02/28/2023   INR 1.1 02/27/2023   Lipid Panel    Component Value Date/Time   CHOL 114 03/03/2023 0722   TRIG 86 03/06/2023 0657   HDL 17 (L) 03/03/2023 0722   CHOLHDL 6.7 03/03/2023 0722   VLDL 51 (H) 03/03/2023 0722   LDLCALC 46 03/03/2023 0722   LDLCALC 72 04/06/2020 0851   HgbA1C  Lab Results  Component Value Date   HGBA1C 6.6 (H) 03/03/2023   Alcohol Level    Component Value Date/Time   ETH <10 03/02/2023 1215     SIGNIFICANT DIAGNOSTIC STUDIES ECHOCARDIOGRAM COMPLETE  Result Date: 03/03/2023    ECHOCARDIOGRAM REPORT   Patient Name:   Robert [name] Date of Exam: 03/03/2023 Medical Rec #:  [id number]         Height:       66.0 in Accession #:    [id number]        Weight:       230.2 lb Date of Birth:  04-Jun-1941         BSA:          2.123 m Patient Age:    82 years          BP:           149/57 mmHg Patient Gender: M                 HR:           53 bpm. Exam Location:  Inpatient Procedure: 2D Echo, Cardiac Doppler and Color Doppler Indications:    Stroke I63.9  History:        Patient has no prior history of Echocardiogram examinations.                 CAD, Prior CABG, Stroke; Risk Factors:Hypertension, Diabetes and                 Dyslipidemia.  Sonographer:    Lucendia Herrlich RCS Referring Phys: [id number] DENISE A WOLFE IMPRESSIONS  1. Left ventricular ejection fraction, by estimation, is 60 to 65%. The left ventricle has normal function. The left ventricle has no regional wall motion abnormalities. Left ventricular diastolic parameters are consistent with Grade I diastolic dysfunction (impaired relaxation).  2. Right ventricular systolic function is normal. The right ventricular size is normal. There is normal pulmonary artery systolic pressure.  3. The mitral valve is normal in structure. Trivial mitral valve regurgitation. No evidence  Stroke Discharge Summary  Patient ID: Robert [name]   MRN: [id number]      DOB: Oct 20, 1940  Date of Admission: 03/02/2023 Date of Discharge: 03/06/2023  Attending Physician:  Stroke, Md, MD Consultant(s):     Interventional radiology   Patient's PCP:  Nelwyn Salisbury, MD  DISCHARGE PRIMARY DIAGNOSIS: Acute left ischemic infarct of left M1 secondary to occluded left ICA s/p IR thrombectomy and carotid stent  Patient Active Problem List   Diagnosis Date Noted   Stroke (cerebrum) (HCC) 03/02/2023   Sepsis (HCC) 02/27/2023   Abrasion, knee, left, initial encounter 01/29/2019   Unilateral primary osteoarthritis, right knee 08/29/2017   Type 2 diabetes mellitus with diabetic neuropathy, unspecified (HCC) 03/10/2017   Neuropathy, diabetic (HCC) 03/10/2017   Malignant neoplasm of prostate (HCC) 07/07/2016   OBESITY 01/08/2009   EDEMA 01/08/2009   ERECTILE DYSFUNCTION 04/24/2008   BPH with urinary obstruction 04/24/2008   Hyperlipidemia 04/13/2007   Essential hypertension 04/13/2007   Coronary atherosclerosis 04/13/2007     Secondary Diagnoses: Hypertension Hyperlipidemia Advanced age Prostate cancer  Allergies as of 03/06/2023   No Known Allergies      Medication List     STOP taking these medications    dexamethasone 4 MG tablet Commonly known as: DECADRON   doxycycline 100 MG tablet Commonly known as: VIBRA-TABS       TAKE these medications    albuterol 108 (90 Base) MCG/ACT inhaler Commonly known as: VENTOLIN HFA Inhale 2 puffs into the lungs every 6 (six) hours as needed for wheezing or shortness of breath.   amLODipine 10 MG tablet Commonly known as: NORVASC Take 1 tablet (10 mg total) by mouth daily.   aspirin 81 MG tablet Take 81 mg by mouth daily.   atorvastatin 40 MG tablet Commonly known as: LIPITOR Take 1 tablet (40 mg total) by mouth daily.   latanoprost 0.005 % ophthalmic solution Commonly known as: XALATAN Place 1 drop into both  eyes at bedtime.   losartan 100 MG tablet Commonly known as: COZAAR Take 1 (one) tablet daily (Take 1 tablet (100 mg total) by mouth daily.)   meloxicam 15 MG tablet Commonly known as: MOBIC Take 1 (one) tablet daily (Take 1 tablet (15 mg total) by mouth daily.)   potassium chloride 10 MEQ tablet Commonly known as: KLOR-CON Take 1 tablet (10 mEq total) by mouth daily.   tamsulosin 0.4 MG Caps capsule Commonly known as: FLOMAX Take 0.4 mg by mouth daily.   ticagrelor 90 MG Tabs tablet Commonly known as: BRILINTA Take 1 tablet (90 mg total) by mouth 2 (two) times daily.               Durable Medical Equipment  (From admission, onward)           Start     Ordered   03/06/23 1114  For home use only DME Walker rolling  Once       Question Answer Comment  Walker: With 5 Inch Wheels   Patient needs a walker to treat with the following condition Stroke (HCC)      03/06/23 1113            LABORATORY STUDIES CBC    Component Value Date/Time   WBC 9.6 03/06/2023 0657   RBC 3.59 (L) 03/06/2023 0657   HGB 11.1 (L) 03/06/2023 0657   HCT 35.1 (L) 03/06/2023 0657   PLT 178 03/06/2023 0657   MCV 97.8 03/06/2023 0657  Stroke Discharge Summary  Patient ID: Robert [name]   MRN: [id number]      DOB: Oct 20, 1940  Date of Admission: 03/02/2023 Date of Discharge: 03/06/2023  Attending Physician:  Stroke, Md, MD Consultant(s):     Interventional radiology   Patient's PCP:  Nelwyn Salisbury, MD  DISCHARGE PRIMARY DIAGNOSIS: Acute left ischemic infarct of left M1 secondary to occluded left ICA s/p IR thrombectomy and carotid stent  Patient Active Problem List   Diagnosis Date Noted   Stroke (cerebrum) (HCC) 03/02/2023   Sepsis (HCC) 02/27/2023   Abrasion, knee, left, initial encounter 01/29/2019   Unilateral primary osteoarthritis, right knee 08/29/2017   Type 2 diabetes mellitus with diabetic neuropathy, unspecified (HCC) 03/10/2017   Neuropathy, diabetic (HCC) 03/10/2017   Malignant neoplasm of prostate (HCC) 07/07/2016   OBESITY 01/08/2009   EDEMA 01/08/2009   ERECTILE DYSFUNCTION 04/24/2008   BPH with urinary obstruction 04/24/2008   Hyperlipidemia 04/13/2007   Essential hypertension 04/13/2007   Coronary atherosclerosis 04/13/2007     Secondary Diagnoses: Hypertension Hyperlipidemia Advanced age Prostate cancer  Allergies as of 03/06/2023   No Known Allergies      Medication List     STOP taking these medications    dexamethasone 4 MG tablet Commonly known as: DECADRON   doxycycline 100 MG tablet Commonly known as: VIBRA-TABS       TAKE these medications    albuterol 108 (90 Base) MCG/ACT inhaler Commonly known as: VENTOLIN HFA Inhale 2 puffs into the lungs every 6 (six) hours as needed for wheezing or shortness of breath.   amLODipine 10 MG tablet Commonly known as: NORVASC Take 1 tablet (10 mg total) by mouth daily.   aspirin 81 MG tablet Take 81 mg by mouth daily.   atorvastatin 40 MG tablet Commonly known as: LIPITOR Take 1 tablet (40 mg total) by mouth daily.   latanoprost 0.005 % ophthalmic solution Commonly known as: XALATAN Place 1 drop into both  eyes at bedtime.   losartan 100 MG tablet Commonly known as: COZAAR Take 1 (one) tablet daily (Take 1 tablet (100 mg total) by mouth daily.)   meloxicam 15 MG tablet Commonly known as: MOBIC Take 1 (one) tablet daily (Take 1 tablet (15 mg total) by mouth daily.)   potassium chloride 10 MEQ tablet Commonly known as: KLOR-CON Take 1 tablet (10 mEq total) by mouth daily.   tamsulosin 0.4 MG Caps capsule Commonly known as: FLOMAX Take 0.4 mg by mouth daily.   ticagrelor 90 MG Tabs tablet Commonly known as: BRILINTA Take 1 tablet (90 mg total) by mouth 2 (two) times daily.               Durable Medical Equipment  (From admission, onward)           Start     Ordered   03/06/23 1114  For home use only DME Walker rolling  Once       Question Answer Comment  Walker: With 5 Inch Wheels   Patient needs a walker to treat with the following condition Stroke (HCC)      03/06/23 1113            LABORATORY STUDIES CBC    Component Value Date/Time   WBC 9.6 03/06/2023 0657   RBC 3.59 (L) 03/06/2023 0657   HGB 11.1 (L) 03/06/2023 0657   HCT 35.1 (L) 03/06/2023 0657   PLT 178 03/06/2023 0657   MCV 97.8 03/06/2023 0657  MCH 30.9 03/06/2023 0657   MCHC 31.6 03/06/2023 0657   RDW 13.4 03/06/2023 0657   LYMPHSABS 1.1 03/02/2023 1041   MONOABS 0.8 03/02/2023 1041   EOSABS 0.0 03/02/2023 1041   BASOSABS 0.0 03/02/2023 1041   CMP    Component Value Date/Time   NA 136 03/06/2023 0657   K 4.1 03/06/2023 0657   CL 104 03/06/2023 0657   CO2 26 03/06/2023 0657   GLUCOSE 131 (H) 03/06/2023 0657   BUN 16 03/06/2023 0657   CREATININE 0.75 03/06/2023 0657   CREATININE 0.91 04/06/2020 0851   CALCIUM 7.9 (L) 03/06/2023 0657   PROT 5.3 (L) 03/02/2023 1215   ALBUMIN 2.6 (L) 03/02/2023 1215   AST 27 03/02/2023 1215   ALT 32 03/02/2023 1215   ALKPHOS 63 03/02/2023 1215   BILITOT 0.5 03/02/2023 1215   GFRNONAA >60 03/06/2023 0657   GFRNONAA 81 04/06/2020 0851   GFRAA  94 04/06/2020 0851   COAGS Lab Results  Component Value Date   INR 1.1 03/02/2023   INR 1.0 02/28/2023   INR 1.1 02/27/2023   Lipid Panel    Component Value Date/Time   CHOL 114 03/03/2023 0722   TRIG 86 03/06/2023 0657   HDL 17 (L) 03/03/2023 0722   CHOLHDL 6.7 03/03/2023 0722   VLDL 51 (H) 03/03/2023 0722   LDLCALC 46 03/03/2023 0722   LDLCALC 72 04/06/2020 0851   HgbA1C  Lab Results  Component Value Date   HGBA1C 6.6 (H) 03/03/2023   Alcohol Level    Component Value Date/Time   ETH <10 03/02/2023 1215     SIGNIFICANT DIAGNOSTIC STUDIES ECHOCARDIOGRAM COMPLETE  Result Date: 03/03/2023    ECHOCARDIOGRAM REPORT   Patient Name:   Robert [name] Date of Exam: 03/03/2023 Medical Rec #:  [id number]         Height:       66.0 in Accession #:    [id number]        Weight:       230.2 lb Date of Birth:  04-Jun-1941         BSA:          2.123 m Patient Age:    82 years          BP:           149/57 mmHg Patient Gender: M                 HR:           53 bpm. Exam Location:  Inpatient Procedure: 2D Echo, Cardiac Doppler and Color Doppler Indications:    Stroke I63.9  History:        Patient has no prior history of Echocardiogram examinations.                 CAD, Prior CABG, Stroke; Risk Factors:Hypertension, Diabetes and                 Dyslipidemia.  Sonographer:    Lucendia Herrlich RCS Referring Phys: [id number] DENISE A WOLFE IMPRESSIONS  1. Left ventricular ejection fraction, by estimation, is 60 to 65%. The left ventricle has normal function. The left ventricle has no regional wall motion abnormalities. Left ventricular diastolic parameters are consistent with Grade I diastolic dysfunction (impaired relaxation).  2. Right ventricular systolic function is normal. The right ventricular size is normal. There is normal pulmonary artery systolic pressure.  3. The mitral valve is normal in structure. Trivial mitral valve regurgitation. No evidence

## 2023-03-06 NOTE — TOC Transition Note (Addendum)
Transition of Care [facility]) - CM/SW Discharge Note   Patient Details  Name: Robert [name] MRN: [id number] Date of Birth: 12-24-1940  Transition of Care [facility]) CM/SW Contact:  Kermit Balo, RN Phone Number: 03/06/2023, 11:18 AM   Clinical Narrative:     Patient is discharging home with home health services through [place]. Information on the AVS. [facility] will contact him for the first home visit. Walker for home ordered through [employer] and will be delivered to his room. 30 day brilinta card provided to beside RN for the patient. Pt spouse to provide supervision at home and transportation to home.  Final next level of care: Home w Home Health Services Barriers to Discharge: No Barriers Identified   Patient Goals and CMS Choice CMS Medicare.gov Compare Post Acute Care list provided to:: Patient Represenative (must comment) Choice offered to / list presented to : Spouse  Discharge Placement                         Discharge Plan and Services Additional resources added to the After Visit Summary for                  DME Arranged: Walker rolling DME Agency: [employer] Date DME Agency Contacted: 03/06/23   Representative spoke with at DME Agency: Zack HH Arranged: PT, OT, Speech Therapy HH Agency: [facility] Date [facility] Agency Contacted: 03/06/23   Representative spoke with at [facility] Agency: Amy  Social Determinants of Health (SDOH) Interventions SDOH Screenings   Food Insecurity: No Food Insecurity (02/27/2023)  Housing: Low Risk  (02/27/2023)  Transportation Needs: No Transportation Needs (02/27/2023)  Utilities: Not At Risk (02/27/2023)  Alcohol Screen: Low Risk  (10/20/2020)  Depression (PHQ2-9): Low Risk  (10/25/2022)  Financial Resource Strain: Low Risk  (10/22/2021)  Physical Activity: Sufficiently Active (10/22/2021)  Social Connections: Socially Integrated (10/22/2021)  Stress: No Stress Concern Present (10/22/2021)  Tobacco Use: Low Risk  (03/02/2023)      Readmission Risk Interventions     No data to display

## 2023-03-06 NOTE — Discharge Instructions (Addendum)
Robert [name],  While I wish it were under different circumstances, it was nice to meet you and participate in your care.  You presented to [facility] after you had some trouble with speaking.  Imaging showed an occluded left carotid artery and an occluded vessel in the left brain.  You underwent surgery to remove these clots, which was successful.  Your left carotid artery now has a stent in it to keep it open.  Please follow these instructions:  1.  It is of extraordinary importance that you take your medication as prescribed, and that you do not miss a dose if you do not absolutely have to.  You are taking aspirin/Brilinta dual antiplatelet therapy (also known as DAPT) for 3 months after your discharge.  At that time, you will need an outpatient follow-up visit with interventional radiology to discuss further therapy.  This is to help prevent further strokes.  It is important that you take your already prescribed medications for blood pressure: Amlodipine 10, losartan 100.  It is also important that you take your Lipitor 40 mg, which will keep your cholesterol low and control it better than any lifestyle changes that you make.  2. Our physical therapy and occupational therapy teams believe that you are well enough to go home with help.  If you have not already done so, please consider getting your home officially examined by Home health to assess for fall risk and make necessary changes where possible.  In the meantime, you can reduce her risk by stabilizing and/or removing loose rugs, cleaning up loose items on the ground, wearing shoes inside the house, keeping everything well lighted and not walking in darkness or when you cannot see.  3.  Please see a primary care provider within 1 week of discharge from the hospital.  You will have a follow-up visit with interventional radiology at some point over the next 3 months -- it is important that you keep his appointment.   Again, thank you for  letting me participate in her care and I wish you all of the best.  Sincerely, Luiz Iron, MD Psychiatry Resident, PGY 1

## 2023-03-06 NOTE — Discharge Summary (Signed)
Stroke Discharge Summary  Patient ID: Robert [name]       MRN: [id number]      DOB: 26-Jan-1941  Date of Admission: 03/02/2023 Date of Discharge: 03/06/2023  Attending Physician:  stroke MD Consultant(s):     Interventional radiology   Patient's PCP:  Nelwyn Salisbury, MD  DISCHARGE PRIMARY DIAGNOSIS:  Stroke - Acute left BG ischemic infarct with left ICA and M1 occlusion s/p IR with TICI3 and carotid stent, likely large vessel disease   Secondary Diagnoses: Hypertension Hyperlipidemia CAD s/p CABG Prostate cancer Recent COVID infection with sepsis  Allergies as of 03/06/2023   No Known Allergies      Medication List     STOP taking these medications    dexamethasone 4 MG tablet Commonly known as: DECADRON   doxycycline 100 MG tablet Commonly known as: VIBRA-TABS       TAKE these medications    albuterol 108 (90 Base) MCG/ACT inhaler Commonly known as: VENTOLIN HFA Inhale 2 puffs into the lungs every 6 (six) hours as needed for wheezing or shortness of breath.   amLODipine 10 MG tablet Commonly known as: NORVASC Take 1 tablet (10 mg total) by mouth daily.   aspirin 81 MG tablet Take 81 mg by mouth daily.   atorvastatin 40 MG tablet Commonly known as: LIPITOR Take 1 tablet (40 mg total) by mouth daily.   Brilinta 90 MG Tabs tablet Generic drug: ticagrelor Take 1 tablet (90 mg total) by mouth 2 (two) times daily.   latanoprost 0.005 % ophthalmic solution Commonly known as: XALATAN Place 1 drop into both eyes at bedtime.   losartan 100 MG tablet Commonly known as: COZAAR Take 1 (one) tablet daily (Take 1 tablet (100 mg total) by mouth daily.)   meloxicam 15 MG tablet Commonly known as: MOBIC Take 1 (one) tablet daily (Take 1 tablet (15 mg total) by mouth daily.)   potassium chloride 10 MEQ tablet Commonly known as: KLOR-CON Take 1 tablet (10 mEq total) by mouth daily.   tamsulosin 0.4 MG Caps capsule Commonly known as: FLOMAX Take 0.4 mg by  mouth daily.        LABORATORY STUDIES CBC    Component Value Date/Time   WBC 9.6 03/06/2023 0657   RBC 3.59 (L) 03/06/2023 0657   HGB 11.1 (L) 03/06/2023 0657   HCT 35.1 (L) 03/06/2023 0657   PLT 178 03/06/2023 0657   MCV 97.8 03/06/2023 0657   MCH 30.9 03/06/2023 0657   MCHC 31.6 03/06/2023 0657   RDW 13.4 03/06/2023 0657   LYMPHSABS 1.1 03/02/2023 1041   MONOABS 0.8 03/02/2023 1041   EOSABS 0.0 03/02/2023 1041   BASOSABS 0.0 03/02/2023 1041   CMP    Component Value Date/Time   NA 136 03/06/2023 0657   K 4.1 03/06/2023 0657   CL 104 03/06/2023 0657   CO2 26 03/06/2023 0657   GLUCOSE 131 (H) 03/06/2023 0657   BUN 16 03/06/2023 0657   CREATININE 0.75 03/06/2023 0657   CREATININE 0.91 04/06/2020 0851   CALCIUM 7.9 (L) 03/06/2023 0657   PROT 5.3 (L) 03/02/2023 1215   ALBUMIN 2.6 (L) 03/02/2023 1215   AST 27 03/02/2023 1215   ALT 32 03/02/2023 1215   ALKPHOS 63 03/02/2023 1215   BILITOT 0.5 03/02/2023 1215   GFRNONAA >60 03/06/2023 0657   GFRNONAA 81 04/06/2020 0851   GFRAA 94 04/06/2020 0851   COAGS Lab Results  Component Value Date   INR 1.1 03/02/2023   INR 1.0  Stroke Discharge Summary  Patient ID: Robert [name]       MRN: [id number]      DOB: 26-Jan-1941  Date of Admission: 03/02/2023 Date of Discharge: 03/06/2023  Attending Physician:  stroke MD Consultant(s):     Interventional radiology   Patient's PCP:  Nelwyn Salisbury, MD  DISCHARGE PRIMARY DIAGNOSIS:  Stroke - Acute left BG ischemic infarct with left ICA and M1 occlusion s/p IR with TICI3 and carotid stent, likely large vessel disease   Secondary Diagnoses: Hypertension Hyperlipidemia CAD s/p CABG Prostate cancer Recent COVID infection with sepsis  Allergies as of 03/06/2023   No Known Allergies      Medication List     STOP taking these medications    dexamethasone 4 MG tablet Commonly known as: DECADRON   doxycycline 100 MG tablet Commonly known as: VIBRA-TABS       TAKE these medications    albuterol 108 (90 Base) MCG/ACT inhaler Commonly known as: VENTOLIN HFA Inhale 2 puffs into the lungs every 6 (six) hours as needed for wheezing or shortness of breath.   amLODipine 10 MG tablet Commonly known as: NORVASC Take 1 tablet (10 mg total) by mouth daily.   aspirin 81 MG tablet Take 81 mg by mouth daily.   atorvastatin 40 MG tablet Commonly known as: LIPITOR Take 1 tablet (40 mg total) by mouth daily.   Brilinta 90 MG Tabs tablet Generic drug: ticagrelor Take 1 tablet (90 mg total) by mouth 2 (two) times daily.   latanoprost 0.005 % ophthalmic solution Commonly known as: XALATAN Place 1 drop into both eyes at bedtime.   losartan 100 MG tablet Commonly known as: COZAAR Take 1 (one) tablet daily (Take 1 tablet (100 mg total) by mouth daily.)   meloxicam 15 MG tablet Commonly known as: MOBIC Take 1 (one) tablet daily (Take 1 tablet (15 mg total) by mouth daily.)   potassium chloride 10 MEQ tablet Commonly known as: KLOR-CON Take 1 tablet (10 mEq total) by mouth daily.   tamsulosin 0.4 MG Caps capsule Commonly known as: FLOMAX Take 0.4 mg by  mouth daily.        LABORATORY STUDIES CBC    Component Value Date/Time   WBC 9.6 03/06/2023 0657   RBC 3.59 (L) 03/06/2023 0657   HGB 11.1 (L) 03/06/2023 0657   HCT 35.1 (L) 03/06/2023 0657   PLT 178 03/06/2023 0657   MCV 97.8 03/06/2023 0657   MCH 30.9 03/06/2023 0657   MCHC 31.6 03/06/2023 0657   RDW 13.4 03/06/2023 0657   LYMPHSABS 1.1 03/02/2023 1041   MONOABS 0.8 03/02/2023 1041   EOSABS 0.0 03/02/2023 1041   BASOSABS 0.0 03/02/2023 1041   CMP    Component Value Date/Time   NA 136 03/06/2023 0657   K 4.1 03/06/2023 0657   CL 104 03/06/2023 0657   CO2 26 03/06/2023 0657   GLUCOSE 131 (H) 03/06/2023 0657   BUN 16 03/06/2023 0657   CREATININE 0.75 03/06/2023 0657   CREATININE 0.91 04/06/2020 0851   CALCIUM 7.9 (L) 03/06/2023 0657   PROT 5.3 (L) 03/02/2023 1215   ALBUMIN 2.6 (L) 03/02/2023 1215   AST 27 03/02/2023 1215   ALT 32 03/02/2023 1215   ALKPHOS 63 03/02/2023 1215   BILITOT 0.5 03/02/2023 1215   GFRNONAA >60 03/06/2023 0657   GFRNONAA 81 04/06/2020 0851   GFRAA 94 04/06/2020 0851   COAGS Lab Results  Component Value Date   INR 1.1 03/02/2023   INR 1.0  02/28/2023   INR 1.1 02/27/2023   Lipid Panel    Component Value Date/Time   CHOL 114 03/03/2023 0722   TRIG 86 03/06/2023 0657   HDL 17 (L) 03/03/2023 0722   CHOLHDL 6.7 03/03/2023 0722   VLDL 51 (H) 03/03/2023 0722   LDLCALC 46 03/03/2023 0722   LDLCALC 72 04/06/2020 0851   HgbA1C  Lab Results  Component Value Date   HGBA1C 6.6 (H) 03/03/2023   Alcohol Level    Component Value Date/Time   ETH <10 03/02/2023 1215     SIGNIFICANT DIAGNOSTIC STUDIES ECHOCARDIOGRAM COMPLETE  Result Date: 03/03/2023    ECHOCARDIOGRAM REPORT   Patient Name:   Robert [name] Date of Exam: 03/03/2023 Medical Rec #:  [id number]         Height:       66.0 in Accession #:    [id number]        Weight:       230.2 lb Date of Birth:  January 23, 1941         BSA:          2.123 m Patient Age:    82 years           BP:           149/57 mmHg Patient Gender: M                 HR:           53 bpm. Exam Location:  Inpatient Procedure: 2D Echo, Cardiac Doppler and Color Doppler Indications:    Stroke I63.9  History:        Patient has no prior history of Echocardiogram examinations.                 CAD, Prior CABG, Stroke; Risk Factors:Hypertension, Diabetes and                 Dyslipidemia.  Sonographer:    Lucendia Herrlich RCS Referring Phys: [phone] DENISE A WOLFE IMPRESSIONS  1. Left ventricular ejection fraction, by estimation, is 60 to 65%. The left ventricle has normal function. The left ventricle has no regional wall motion abnormalities. Left ventricular diastolic parameters are consistent with Grade I diastolic dysfunction (impaired relaxation).  2. Right ventricular systolic function is normal. The right ventricular size is normal. There is normal pulmonary artery systolic pressure.  3. The mitral valve is normal in structure. Trivial mitral valve regurgitation. No evidence of mitral stenosis. Moderate mitral annular calcification.  4. The aortic valve is calcified. There is mild calcification of the aortic valve. There is mild thickening of the aortic valve. Aortic valve regurgitation is not visualized. No aortic stenosis is present.  5. The inferior vena cava is normal in size with greater than 50% respiratory variability, suggesting right atrial pressure of 3 mmHg. FINDINGS  Left Ventricle: Left ventricular ejection fraction, by estimation, is 60 to 65%. The left ventricle has normal function. The left ventricle has no regional wall motion abnormalities. The left ventricular internal cavity size was normal in size. There is  no left ventricular hypertrophy. Left ventricular diastolic parameters are consistent with Grade I diastolic dysfunction (impaired relaxation). Indeterminate filling pressures. Right Ventricle: The right ventricular size is normal. No increase in right ventricular wall thickness. Right ventricular  systolic function is normal. There is normal pulmonary artery systolic pressure. The tricuspid regurgitant velocity is 2.67 m/s, and  with an assumed right atrial pressure of 3 mmHg, the estimated right ventricular systolic pressure  Stroke Discharge Summary  Patient ID: Robert [name]       MRN: [id number]      DOB: 26-Jan-1941  Date of Admission: 03/02/2023 Date of Discharge: 03/06/2023  Attending Physician:  stroke MD Consultant(s):     Interventional radiology   Patient's PCP:  Nelwyn Salisbury, MD  DISCHARGE PRIMARY DIAGNOSIS:  Stroke - Acute left BG ischemic infarct with left ICA and M1 occlusion s/p IR with TICI3 and carotid stent, likely large vessel disease   Secondary Diagnoses: Hypertension Hyperlipidemia CAD s/p CABG Prostate cancer Recent COVID infection with sepsis  Allergies as of 03/06/2023   No Known Allergies      Medication List     STOP taking these medications    dexamethasone 4 MG tablet Commonly known as: DECADRON   doxycycline 100 MG tablet Commonly known as: VIBRA-TABS       TAKE these medications    albuterol 108 (90 Base) MCG/ACT inhaler Commonly known as: VENTOLIN HFA Inhale 2 puffs into the lungs every 6 (six) hours as needed for wheezing or shortness of breath.   amLODipine 10 MG tablet Commonly known as: NORVASC Take 1 tablet (10 mg total) by mouth daily.   aspirin 81 MG tablet Take 81 mg by mouth daily.   atorvastatin 40 MG tablet Commonly known as: LIPITOR Take 1 tablet (40 mg total) by mouth daily.   Brilinta 90 MG Tabs tablet Generic drug: ticagrelor Take 1 tablet (90 mg total) by mouth 2 (two) times daily.   latanoprost 0.005 % ophthalmic solution Commonly known as: XALATAN Place 1 drop into both eyes at bedtime.   losartan 100 MG tablet Commonly known as: COZAAR Take 1 (one) tablet daily (Take 1 tablet (100 mg total) by mouth daily.)   meloxicam 15 MG tablet Commonly known as: MOBIC Take 1 (one) tablet daily (Take 1 tablet (15 mg total) by mouth daily.)   potassium chloride 10 MEQ tablet Commonly known as: KLOR-CON Take 1 tablet (10 mEq total) by mouth daily.   tamsulosin 0.4 MG Caps capsule Commonly known as: FLOMAX Take 0.4 mg by  mouth daily.        LABORATORY STUDIES CBC    Component Value Date/Time   WBC 9.6 03/06/2023 0657   RBC 3.59 (L) 03/06/2023 0657   HGB 11.1 (L) 03/06/2023 0657   HCT 35.1 (L) 03/06/2023 0657   PLT 178 03/06/2023 0657   MCV 97.8 03/06/2023 0657   MCH 30.9 03/06/2023 0657   MCHC 31.6 03/06/2023 0657   RDW 13.4 03/06/2023 0657   LYMPHSABS 1.1 03/02/2023 1041   MONOABS 0.8 03/02/2023 1041   EOSABS 0.0 03/02/2023 1041   BASOSABS 0.0 03/02/2023 1041   CMP    Component Value Date/Time   NA 136 03/06/2023 0657   K 4.1 03/06/2023 0657   CL 104 03/06/2023 0657   CO2 26 03/06/2023 0657   GLUCOSE 131 (H) 03/06/2023 0657   BUN 16 03/06/2023 0657   CREATININE 0.75 03/06/2023 0657   CREATININE 0.91 04/06/2020 0851   CALCIUM 7.9 (L) 03/06/2023 0657   PROT 5.3 (L) 03/02/2023 1215   ALBUMIN 2.6 (L) 03/02/2023 1215   AST 27 03/02/2023 1215   ALT 32 03/02/2023 1215   ALKPHOS 63 03/02/2023 1215   BILITOT 0.5 03/02/2023 1215   GFRNONAA >60 03/06/2023 0657   GFRNONAA 81 04/06/2020 0851   GFRAA 94 04/06/2020 0851   COAGS Lab Results  Component Value Date   INR 1.1 03/02/2023   INR 1.0  02/28/2023   INR 1.1 02/27/2023   Lipid Panel    Component Value Date/Time   CHOL 114 03/03/2023 0722   TRIG 86 03/06/2023 0657   HDL 17 (L) 03/03/2023 0722   CHOLHDL 6.7 03/03/2023 0722   VLDL 51 (H) 03/03/2023 0722   LDLCALC 46 03/03/2023 0722   LDLCALC 72 04/06/2020 0851   HgbA1C  Lab Results  Component Value Date   HGBA1C 6.6 (H) 03/03/2023   Alcohol Level    Component Value Date/Time   ETH <10 03/02/2023 1215     SIGNIFICANT DIAGNOSTIC STUDIES ECHOCARDIOGRAM COMPLETE  Result Date: 03/03/2023    ECHOCARDIOGRAM REPORT   Patient Name:   Robert [name] Date of Exam: 03/03/2023 Medical Rec #:  [id number]         Height:       66.0 in Accession #:    [id number]        Weight:       230.2 lb Date of Birth:  January 23, 1941         BSA:          2.123 m Patient Age:    82 years           BP:           149/57 mmHg Patient Gender: M                 HR:           53 bpm. Exam Location:  Inpatient Procedure: 2D Echo, Cardiac Doppler and Color Doppler Indications:    Stroke I63.9  History:        Patient has no prior history of Echocardiogram examinations.                 CAD, Prior CABG, Stroke; Risk Factors:Hypertension, Diabetes and                 Dyslipidemia.  Sonographer:    Lucendia Herrlich RCS Referring Phys: [phone] DENISE A WOLFE IMPRESSIONS  1. Left ventricular ejection fraction, by estimation, is 60 to 65%. The left ventricle has normal function. The left ventricle has no regional wall motion abnormalities. Left ventricular diastolic parameters are consistent with Grade I diastolic dysfunction (impaired relaxation).  2. Right ventricular systolic function is normal. The right ventricular size is normal. There is normal pulmonary artery systolic pressure.  3. The mitral valve is normal in structure. Trivial mitral valve regurgitation. No evidence of mitral stenosis. Moderate mitral annular calcification.  4. The aortic valve is calcified. There is mild calcification of the aortic valve. There is mild thickening of the aortic valve. Aortic valve regurgitation is not visualized. No aortic stenosis is present.  5. The inferior vena cava is normal in size with greater than 50% respiratory variability, suggesting right atrial pressure of 3 mmHg. FINDINGS  Left Ventricle: Left ventricular ejection fraction, by estimation, is 60 to 65%. The left ventricle has normal function. The left ventricle has no regional wall motion abnormalities. The left ventricular internal cavity size was normal in size. There is  no left ventricular hypertrophy. Left ventricular diastolic parameters are consistent with Grade I diastolic dysfunction (impaired relaxation). Indeterminate filling pressures. Right Ventricle: The right ventricular size is normal. No increase in right ventricular wall thickness. Right ventricular  systolic function is normal. There is normal pulmonary artery systolic pressure. The tricuspid regurgitant velocity is 2.67 m/s, and  with an assumed right atrial pressure of 3 mmHg, the estimated right ventricular systolic pressure  Stroke Discharge Summary  Patient ID: Robert [name]       MRN: [id number]      DOB: 26-Jan-1941  Date of Admission: 03/02/2023 Date of Discharge: 03/06/2023  Attending Physician:  stroke MD Consultant(s):     Interventional radiology   Patient's PCP:  Nelwyn Salisbury, MD  DISCHARGE PRIMARY DIAGNOSIS:  Stroke - Acute left BG ischemic infarct with left ICA and M1 occlusion s/p IR with TICI3 and carotid stent, likely large vessel disease   Secondary Diagnoses: Hypertension Hyperlipidemia CAD s/p CABG Prostate cancer Recent COVID infection with sepsis  Allergies as of 03/06/2023   No Known Allergies      Medication List     STOP taking these medications    dexamethasone 4 MG tablet Commonly known as: DECADRON   doxycycline 100 MG tablet Commonly known as: VIBRA-TABS       TAKE these medications    albuterol 108 (90 Base) MCG/ACT inhaler Commonly known as: VENTOLIN HFA Inhale 2 puffs into the lungs every 6 (six) hours as needed for wheezing or shortness of breath.   amLODipine 10 MG tablet Commonly known as: NORVASC Take 1 tablet (10 mg total) by mouth daily.   aspirin 81 MG tablet Take 81 mg by mouth daily.   atorvastatin 40 MG tablet Commonly known as: LIPITOR Take 1 tablet (40 mg total) by mouth daily.   Brilinta 90 MG Tabs tablet Generic drug: ticagrelor Take 1 tablet (90 mg total) by mouth 2 (two) times daily.   latanoprost 0.005 % ophthalmic solution Commonly known as: XALATAN Place 1 drop into both eyes at bedtime.   losartan 100 MG tablet Commonly known as: COZAAR Take 1 (one) tablet daily (Take 1 tablet (100 mg total) by mouth daily.)   meloxicam 15 MG tablet Commonly known as: MOBIC Take 1 (one) tablet daily (Take 1 tablet (15 mg total) by mouth daily.)   potassium chloride 10 MEQ tablet Commonly known as: KLOR-CON Take 1 tablet (10 mEq total) by mouth daily.   tamsulosin 0.4 MG Caps capsule Commonly known as: FLOMAX Take 0.4 mg by  mouth daily.        LABORATORY STUDIES CBC    Component Value Date/Time   WBC 9.6 03/06/2023 0657   RBC 3.59 (L) 03/06/2023 0657   HGB 11.1 (L) 03/06/2023 0657   HCT 35.1 (L) 03/06/2023 0657   PLT 178 03/06/2023 0657   MCV 97.8 03/06/2023 0657   MCH 30.9 03/06/2023 0657   MCHC 31.6 03/06/2023 0657   RDW 13.4 03/06/2023 0657   LYMPHSABS 1.1 03/02/2023 1041   MONOABS 0.8 03/02/2023 1041   EOSABS 0.0 03/02/2023 1041   BASOSABS 0.0 03/02/2023 1041   CMP    Component Value Date/Time   NA 136 03/06/2023 0657   K 4.1 03/06/2023 0657   CL 104 03/06/2023 0657   CO2 26 03/06/2023 0657   GLUCOSE 131 (H) 03/06/2023 0657   BUN 16 03/06/2023 0657   CREATININE 0.75 03/06/2023 0657   CREATININE 0.91 04/06/2020 0851   CALCIUM 7.9 (L) 03/06/2023 0657   PROT 5.3 (L) 03/02/2023 1215   ALBUMIN 2.6 (L) 03/02/2023 1215   AST 27 03/02/2023 1215   ALT 32 03/02/2023 1215   ALKPHOS 63 03/02/2023 1215   BILITOT 0.5 03/02/2023 1215   GFRNONAA >60 03/06/2023 0657   GFRNONAA 81 04/06/2020 0851   GFRAA 94 04/06/2020 0851   COAGS Lab Results  Component Value Date   INR 1.1 03/02/2023   INR 1.0  02/28/2023   INR 1.1 02/27/2023   Lipid Panel    Component Value Date/Time   CHOL 114 03/03/2023 0722   TRIG 86 03/06/2023 0657   HDL 17 (L) 03/03/2023 0722   CHOLHDL 6.7 03/03/2023 0722   VLDL 51 (H) 03/03/2023 0722   LDLCALC 46 03/03/2023 0722   LDLCALC 72 04/06/2020 0851   HgbA1C  Lab Results  Component Value Date   HGBA1C 6.6 (H) 03/03/2023   Alcohol Level    Component Value Date/Time   ETH <10 03/02/2023 1215     SIGNIFICANT DIAGNOSTIC STUDIES ECHOCARDIOGRAM COMPLETE  Result Date: 03/03/2023    ECHOCARDIOGRAM REPORT   Patient Name:   Robert [name] Date of Exam: 03/03/2023 Medical Rec #:  [id number]         Height:       66.0 in Accession #:    [id number]        Weight:       230.2 lb Date of Birth:  January 23, 1941         BSA:          2.123 m Patient Age:    82 years           BP:           149/57 mmHg Patient Gender: M                 HR:           53 bpm. Exam Location:  Inpatient Procedure: 2D Echo, Cardiac Doppler and Color Doppler Indications:    Stroke I63.9  History:        Patient has no prior history of Echocardiogram examinations.                 CAD, Prior CABG, Stroke; Risk Factors:Hypertension, Diabetes and                 Dyslipidemia.  Sonographer:    Lucendia Herrlich RCS Referring Phys: [phone] DENISE A WOLFE IMPRESSIONS  1. Left ventricular ejection fraction, by estimation, is 60 to 65%. The left ventricle has normal function. The left ventricle has no regional wall motion abnormalities. Left ventricular diastolic parameters are consistent with Grade I diastolic dysfunction (impaired relaxation).  2. Right ventricular systolic function is normal. The right ventricular size is normal. There is normal pulmonary artery systolic pressure.  3. The mitral valve is normal in structure. Trivial mitral valve regurgitation. No evidence of mitral stenosis. Moderate mitral annular calcification.  4. The aortic valve is calcified. There is mild calcification of the aortic valve. There is mild thickening of the aortic valve. Aortic valve regurgitation is not visualized. No aortic stenosis is present.  5. The inferior vena cava is normal in size with greater than 50% respiratory variability, suggesting right atrial pressure of 3 mmHg. FINDINGS  Left Ventricle: Left ventricular ejection fraction, by estimation, is 60 to 65%. The left ventricle has normal function. The left ventricle has no regional wall motion abnormalities. The left ventricular internal cavity size was normal in size. There is  no left ventricular hypertrophy. Left ventricular diastolic parameters are consistent with Grade I diastolic dysfunction (impaired relaxation). Indeterminate filling pressures. Right Ventricle: The right ventricular size is normal. No increase in right ventricular wall thickness. Right ventricular  systolic function is normal. There is normal pulmonary artery systolic pressure. The tricuspid regurgitant velocity is 2.67 m/s, and  with an assumed right atrial pressure of 3 mmHg, the estimated right ventricular systolic pressure  Stroke Discharge Summary  Patient ID: Robert [name]       MRN: [id number]      DOB: 26-Jan-1941  Date of Admission: 03/02/2023 Date of Discharge: 03/06/2023  Attending Physician:  stroke MD Consultant(s):     Interventional radiology   Patient's PCP:  Nelwyn Salisbury, MD  DISCHARGE PRIMARY DIAGNOSIS:  Stroke - Acute left BG ischemic infarct with left ICA and M1 occlusion s/p IR with TICI3 and carotid stent, likely large vessel disease   Secondary Diagnoses: Hypertension Hyperlipidemia CAD s/p CABG Prostate cancer Recent COVID infection with sepsis  Allergies as of 03/06/2023   No Known Allergies      Medication List     STOP taking these medications    dexamethasone 4 MG tablet Commonly known as: DECADRON   doxycycline 100 MG tablet Commonly known as: VIBRA-TABS       TAKE these medications    albuterol 108 (90 Base) MCG/ACT inhaler Commonly known as: VENTOLIN HFA Inhale 2 puffs into the lungs every 6 (six) hours as needed for wheezing or shortness of breath.   amLODipine 10 MG tablet Commonly known as: NORVASC Take 1 tablet (10 mg total) by mouth daily.   aspirin 81 MG tablet Take 81 mg by mouth daily.   atorvastatin 40 MG tablet Commonly known as: LIPITOR Take 1 tablet (40 mg total) by mouth daily.   Brilinta 90 MG Tabs tablet Generic drug: ticagrelor Take 1 tablet (90 mg total) by mouth 2 (two) times daily.   latanoprost 0.005 % ophthalmic solution Commonly known as: XALATAN Place 1 drop into both eyes at bedtime.   losartan 100 MG tablet Commonly known as: COZAAR Take 1 (one) tablet daily (Take 1 tablet (100 mg total) by mouth daily.)   meloxicam 15 MG tablet Commonly known as: MOBIC Take 1 (one) tablet daily (Take 1 tablet (15 mg total) by mouth daily.)   potassium chloride 10 MEQ tablet Commonly known as: KLOR-CON Take 1 tablet (10 mEq total) by mouth daily.   tamsulosin 0.4 MG Caps capsule Commonly known as: FLOMAX Take 0.4 mg by  mouth daily.        LABORATORY STUDIES CBC    Component Value Date/Time   WBC 9.6 03/06/2023 0657   RBC 3.59 (L) 03/06/2023 0657   HGB 11.1 (L) 03/06/2023 0657   HCT 35.1 (L) 03/06/2023 0657   PLT 178 03/06/2023 0657   MCV 97.8 03/06/2023 0657   MCH 30.9 03/06/2023 0657   MCHC 31.6 03/06/2023 0657   RDW 13.4 03/06/2023 0657   LYMPHSABS 1.1 03/02/2023 1041   MONOABS 0.8 03/02/2023 1041   EOSABS 0.0 03/02/2023 1041   BASOSABS 0.0 03/02/2023 1041   CMP    Component Value Date/Time   NA 136 03/06/2023 0657   K 4.1 03/06/2023 0657   CL 104 03/06/2023 0657   CO2 26 03/06/2023 0657   GLUCOSE 131 (H) 03/06/2023 0657   BUN 16 03/06/2023 0657   CREATININE 0.75 03/06/2023 0657   CREATININE 0.91 04/06/2020 0851   CALCIUM 7.9 (L) 03/06/2023 0657   PROT 5.3 (L) 03/02/2023 1215   ALBUMIN 2.6 (L) 03/02/2023 1215   AST 27 03/02/2023 1215   ALT 32 03/02/2023 1215   ALKPHOS 63 03/02/2023 1215   BILITOT 0.5 03/02/2023 1215   GFRNONAA >60 03/06/2023 0657   GFRNONAA 81 04/06/2020 0851   GFRAA 94 04/06/2020 0851   COAGS Lab Results  Component Value Date   INR 1.1 03/02/2023   INR 1.0  Stroke Discharge Summary  Patient ID: Robert [name]       MRN: [id number]      DOB: 26-Jan-1941  Date of Admission: 03/02/2023 Date of Discharge: 03/06/2023  Attending Physician:  stroke MD Consultant(s):     Interventional radiology   Patient's PCP:  Nelwyn Salisbury, MD  DISCHARGE PRIMARY DIAGNOSIS:  Stroke - Acute left BG ischemic infarct with left ICA and M1 occlusion s/p IR with TICI3 and carotid stent, likely large vessel disease   Secondary Diagnoses: Hypertension Hyperlipidemia CAD s/p CABG Prostate cancer Recent COVID infection with sepsis  Allergies as of 03/06/2023   No Known Allergies      Medication List     STOP taking these medications    dexamethasone 4 MG tablet Commonly known as: DECADRON   doxycycline 100 MG tablet Commonly known as: VIBRA-TABS       TAKE these medications    albuterol 108 (90 Base) MCG/ACT inhaler Commonly known as: VENTOLIN HFA Inhale 2 puffs into the lungs every 6 (six) hours as needed for wheezing or shortness of breath.   amLODipine 10 MG tablet Commonly known as: NORVASC Take 1 tablet (10 mg total) by mouth daily.   aspirin 81 MG tablet Take 81 mg by mouth daily.   atorvastatin 40 MG tablet Commonly known as: LIPITOR Take 1 tablet (40 mg total) by mouth daily.   Brilinta 90 MG Tabs tablet Generic drug: ticagrelor Take 1 tablet (90 mg total) by mouth 2 (two) times daily.   latanoprost 0.005 % ophthalmic solution Commonly known as: XALATAN Place 1 drop into both eyes at bedtime.   losartan 100 MG tablet Commonly known as: COZAAR Take 1 (one) tablet daily (Take 1 tablet (100 mg total) by mouth daily.)   meloxicam 15 MG tablet Commonly known as: MOBIC Take 1 (one) tablet daily (Take 1 tablet (15 mg total) by mouth daily.)   potassium chloride 10 MEQ tablet Commonly known as: KLOR-CON Take 1 tablet (10 mEq total) by mouth daily.   tamsulosin 0.4 MG Caps capsule Commonly known as: FLOMAX Take 0.4 mg by  mouth daily.        LABORATORY STUDIES CBC    Component Value Date/Time   WBC 9.6 03/06/2023 0657   RBC 3.59 (L) 03/06/2023 0657   HGB 11.1 (L) 03/06/2023 0657   HCT 35.1 (L) 03/06/2023 0657   PLT 178 03/06/2023 0657   MCV 97.8 03/06/2023 0657   MCH 30.9 03/06/2023 0657   MCHC 31.6 03/06/2023 0657   RDW 13.4 03/06/2023 0657   LYMPHSABS 1.1 03/02/2023 1041   MONOABS 0.8 03/02/2023 1041   EOSABS 0.0 03/02/2023 1041   BASOSABS 0.0 03/02/2023 1041   CMP    Component Value Date/Time   NA 136 03/06/2023 0657   K 4.1 03/06/2023 0657   CL 104 03/06/2023 0657   CO2 26 03/06/2023 0657   GLUCOSE 131 (H) 03/06/2023 0657   BUN 16 03/06/2023 0657   CREATININE 0.75 03/06/2023 0657   CREATININE 0.91 04/06/2020 0851   CALCIUM 7.9 (L) 03/06/2023 0657   PROT 5.3 (L) 03/02/2023 1215   ALBUMIN 2.6 (L) 03/02/2023 1215   AST 27 03/02/2023 1215   ALT 32 03/02/2023 1215   ALKPHOS 63 03/02/2023 1215   BILITOT 0.5 03/02/2023 1215   GFRNONAA >60 03/06/2023 0657   GFRNONAA 81 04/06/2020 0851   GFRAA 94 04/06/2020 0851   COAGS Lab Results  Component Value Date   INR 1.1 03/02/2023   INR 1.0  Stroke Discharge Summary  Patient ID: Robert [name]       MRN: [id number]      DOB: 26-Jan-1941  Date of Admission: 03/02/2023 Date of Discharge: 03/06/2023  Attending Physician:  stroke MD Consultant(s):     Interventional radiology   Patient's PCP:  Nelwyn Salisbury, MD  DISCHARGE PRIMARY DIAGNOSIS:  Stroke - Acute left BG ischemic infarct with left ICA and M1 occlusion s/p IR with TICI3 and carotid stent, likely large vessel disease   Secondary Diagnoses: Hypertension Hyperlipidemia CAD s/p CABG Prostate cancer Recent COVID infection with sepsis  Allergies as of 03/06/2023   No Known Allergies      Medication List     STOP taking these medications    dexamethasone 4 MG tablet Commonly known as: DECADRON   doxycycline 100 MG tablet Commonly known as: VIBRA-TABS       TAKE these medications    albuterol 108 (90 Base) MCG/ACT inhaler Commonly known as: VENTOLIN HFA Inhale 2 puffs into the lungs every 6 (six) hours as needed for wheezing or shortness of breath.   amLODipine 10 MG tablet Commonly known as: NORVASC Take 1 tablet (10 mg total) by mouth daily.   aspirin 81 MG tablet Take 81 mg by mouth daily.   atorvastatin 40 MG tablet Commonly known as: LIPITOR Take 1 tablet (40 mg total) by mouth daily.   Brilinta 90 MG Tabs tablet Generic drug: ticagrelor Take 1 tablet (90 mg total) by mouth 2 (two) times daily.   latanoprost 0.005 % ophthalmic solution Commonly known as: XALATAN Place 1 drop into both eyes at bedtime.   losartan 100 MG tablet Commonly known as: COZAAR Take 1 (one) tablet daily (Take 1 tablet (100 mg total) by mouth daily.)   meloxicam 15 MG tablet Commonly known as: MOBIC Take 1 (one) tablet daily (Take 1 tablet (15 mg total) by mouth daily.)   potassium chloride 10 MEQ tablet Commonly known as: KLOR-CON Take 1 tablet (10 mEq total) by mouth daily.   tamsulosin 0.4 MG Caps capsule Commonly known as: FLOMAX Take 0.4 mg by  mouth daily.        LABORATORY STUDIES CBC    Component Value Date/Time   WBC 9.6 03/06/2023 0657   RBC 3.59 (L) 03/06/2023 0657   HGB 11.1 (L) 03/06/2023 0657   HCT 35.1 (L) 03/06/2023 0657   PLT 178 03/06/2023 0657   MCV 97.8 03/06/2023 0657   MCH 30.9 03/06/2023 0657   MCHC 31.6 03/06/2023 0657   RDW 13.4 03/06/2023 0657   LYMPHSABS 1.1 03/02/2023 1041   MONOABS 0.8 03/02/2023 1041   EOSABS 0.0 03/02/2023 1041   BASOSABS 0.0 03/02/2023 1041   CMP    Component Value Date/Time   NA 136 03/06/2023 0657   K 4.1 03/06/2023 0657   CL 104 03/06/2023 0657   CO2 26 03/06/2023 0657   GLUCOSE 131 (H) 03/06/2023 0657   BUN 16 03/06/2023 0657   CREATININE 0.75 03/06/2023 0657   CREATININE 0.91 04/06/2020 0851   CALCIUM 7.9 (L) 03/06/2023 0657   PROT 5.3 (L) 03/02/2023 1215   ALBUMIN 2.6 (L) 03/02/2023 1215   AST 27 03/02/2023 1215   ALT 32 03/02/2023 1215   ALKPHOS 63 03/02/2023 1215   BILITOT 0.5 03/02/2023 1215   GFRNONAA >60 03/06/2023 0657   GFRNONAA 81 04/06/2020 0851   GFRAA 94 04/06/2020 0851   COAGS Lab Results  Component Value Date   INR 1.1 03/02/2023   INR 1.0  02/28/2023   INR 1.1 02/27/2023   Lipid Panel    Component Value Date/Time   CHOL 114 03/03/2023 0722   TRIG 86 03/06/2023 0657   HDL 17 (L) 03/03/2023 0722   CHOLHDL 6.7 03/03/2023 0722   VLDL 51 (H) 03/03/2023 0722   LDLCALC 46 03/03/2023 0722   LDLCALC 72 04/06/2020 0851   HgbA1C  Lab Results  Component Value Date   HGBA1C 6.6 (H) 03/03/2023   Alcohol Level    Component Value Date/Time   ETH <10 03/02/2023 1215     SIGNIFICANT DIAGNOSTIC STUDIES ECHOCARDIOGRAM COMPLETE  Result Date: 03/03/2023    ECHOCARDIOGRAM REPORT   Patient Name:   Robert [name] Date of Exam: 03/03/2023 Medical Rec #:  [id number]         Height:       66.0 in Accession #:    [id number]        Weight:       230.2 lb Date of Birth:  January 23, 1941         BSA:          2.123 m Patient Age:    82 years           BP:           149/57 mmHg Patient Gender: M                 HR:           53 bpm. Exam Location:  Inpatient Procedure: 2D Echo, Cardiac Doppler and Color Doppler Indications:    Stroke I63.9  History:        Patient has no prior history of Echocardiogram examinations.                 CAD, Prior CABG, Stroke; Risk Factors:Hypertension, Diabetes and                 Dyslipidemia.  Sonographer:    Lucendia Herrlich RCS Referring Phys: [phone] DENISE A WOLFE IMPRESSIONS  1. Left ventricular ejection fraction, by estimation, is 60 to 65%. The left ventricle has normal function. The left ventricle has no regional wall motion abnormalities. Left ventricular diastolic parameters are consistent with Grade I diastolic dysfunction (impaired relaxation).  2. Right ventricular systolic function is normal. The right ventricular size is normal. There is normal pulmonary artery systolic pressure.  3. The mitral valve is normal in structure. Trivial mitral valve regurgitation. No evidence of mitral stenosis. Moderate mitral annular calcification.  4. The aortic valve is calcified. There is mild calcification of the aortic valve. There is mild thickening of the aortic valve. Aortic valve regurgitation is not visualized. No aortic stenosis is present.  5. The inferior vena cava is normal in size with greater than 50% respiratory variability, suggesting right atrial pressure of 3 mmHg. FINDINGS  Left Ventricle: Left ventricular ejection fraction, by estimation, is 60 to 65%. The left ventricle has normal function. The left ventricle has no regional wall motion abnormalities. The left ventricular internal cavity size was normal in size. There is  no left ventricular hypertrophy. Left ventricular diastolic parameters are consistent with Grade I diastolic dysfunction (impaired relaxation). Indeterminate filling pressures. Right Ventricle: The right ventricular size is normal. No increase in right ventricular wall thickness. Right ventricular  systolic function is normal. There is normal pulmonary artery systolic pressure. The tricuspid regurgitant velocity is 2.67 m/s, and  with an assumed right atrial pressure of 3 mmHg, the estimated right ventricular systolic pressure  02/28/2023   INR 1.1 02/27/2023   Lipid Panel    Component Value Date/Time   CHOL 114 03/03/2023 0722   TRIG 86 03/06/2023 0657   HDL 17 (L) 03/03/2023 0722   CHOLHDL 6.7 03/03/2023 0722   VLDL 51 (H) 03/03/2023 0722   LDLCALC 46 03/03/2023 0722   LDLCALC 72 04/06/2020 0851   HgbA1C  Lab Results  Component Value Date   HGBA1C 6.6 (H) 03/03/2023   Alcohol Level    Component Value Date/Time   ETH <10 03/02/2023 1215     SIGNIFICANT DIAGNOSTIC STUDIES ECHOCARDIOGRAM COMPLETE  Result Date: 03/03/2023    ECHOCARDIOGRAM REPORT   Patient Name:   Robert [name] Date of Exam: 03/03/2023 Medical Rec #:  [id number]         Height:       66.0 in Accession #:    [id number]        Weight:       230.2 lb Date of Birth:  January 23, 1941         BSA:          2.123 m Patient Age:    82 years           BP:           149/57 mmHg Patient Gender: M                 HR:           53 bpm. Exam Location:  Inpatient Procedure: 2D Echo, Cardiac Doppler and Color Doppler Indications:    Stroke I63.9  History:        Patient has no prior history of Echocardiogram examinations.                 CAD, Prior CABG, Stroke; Risk Factors:Hypertension, Diabetes and                 Dyslipidemia.  Sonographer:    Lucendia Herrlich RCS Referring Phys: [phone] DENISE A WOLFE IMPRESSIONS  1. Left ventricular ejection fraction, by estimation, is 60 to 65%. The left ventricle has normal function. The left ventricle has no regional wall motion abnormalities. Left ventricular diastolic parameters are consistent with Grade I diastolic dysfunction (impaired relaxation).  2. Right ventricular systolic function is normal. The right ventricular size is normal. There is normal pulmonary artery systolic pressure.  3. The mitral valve is normal in structure. Trivial mitral valve regurgitation. No evidence of mitral stenosis. Moderate mitral annular calcification.  4. The aortic valve is calcified. There is mild calcification of the aortic valve. There is mild thickening of the aortic valve. Aortic valve regurgitation is not visualized. No aortic stenosis is present.  5. The inferior vena cava is normal in size with greater than 50% respiratory variability, suggesting right atrial pressure of 3 mmHg. FINDINGS  Left Ventricle: Left ventricular ejection fraction, by estimation, is 60 to 65%. The left ventricle has normal function. The left ventricle has no regional wall motion abnormalities. The left ventricular internal cavity size was normal in size. There is  no left ventricular hypertrophy. Left ventricular diastolic parameters are consistent with Grade I diastolic dysfunction (impaired relaxation). Indeterminate filling pressures. Right Ventricle: The right ventricular size is normal. No increase in right ventricular wall thickness. Right ventricular  systolic function is normal. There is normal pulmonary artery systolic pressure. The tricuspid regurgitant velocity is 2.67 m/s, and  with an assumed right atrial pressure of 3 mmHg, the estimated right ventricular systolic pressure  Stroke Discharge Summary  Patient ID: Robert [name]       MRN: [id number]      DOB: 26-Jan-1941  Date of Admission: 03/02/2023 Date of Discharge: 03/06/2023  Attending Physician:  stroke MD Consultant(s):     Interventional radiology   Patient's PCP:  Nelwyn Salisbury, MD  DISCHARGE PRIMARY DIAGNOSIS:  Stroke - Acute left BG ischemic infarct with left ICA and M1 occlusion s/p IR with TICI3 and carotid stent, likely large vessel disease   Secondary Diagnoses: Hypertension Hyperlipidemia CAD s/p CABG Prostate cancer Recent COVID infection with sepsis  Allergies as of 03/06/2023   No Known Allergies      Medication List     STOP taking these medications    dexamethasone 4 MG tablet Commonly known as: DECADRON   doxycycline 100 MG tablet Commonly known as: VIBRA-TABS       TAKE these medications    albuterol 108 (90 Base) MCG/ACT inhaler Commonly known as: VENTOLIN HFA Inhale 2 puffs into the lungs every 6 (six) hours as needed for wheezing or shortness of breath.   amLODipine 10 MG tablet Commonly known as: NORVASC Take 1 tablet (10 mg total) by mouth daily.   aspirin 81 MG tablet Take 81 mg by mouth daily.   atorvastatin 40 MG tablet Commonly known as: LIPITOR Take 1 tablet (40 mg total) by mouth daily.   Brilinta 90 MG Tabs tablet Generic drug: ticagrelor Take 1 tablet (90 mg total) by mouth 2 (two) times daily.   latanoprost 0.005 % ophthalmic solution Commonly known as: XALATAN Place 1 drop into both eyes at bedtime.   losartan 100 MG tablet Commonly known as: COZAAR Take 1 (one) tablet daily (Take 1 tablet (100 mg total) by mouth daily.)   meloxicam 15 MG tablet Commonly known as: MOBIC Take 1 (one) tablet daily (Take 1 tablet (15 mg total) by mouth daily.)   potassium chloride 10 MEQ tablet Commonly known as: KLOR-CON Take 1 tablet (10 mEq total) by mouth daily.   tamsulosin 0.4 MG Caps capsule Commonly known as: FLOMAX Take 0.4 mg by  mouth daily.        LABORATORY STUDIES CBC    Component Value Date/Time   WBC 9.6 03/06/2023 0657   RBC 3.59 (L) 03/06/2023 0657   HGB 11.1 (L) 03/06/2023 0657   HCT 35.1 (L) 03/06/2023 0657   PLT 178 03/06/2023 0657   MCV 97.8 03/06/2023 0657   MCH 30.9 03/06/2023 0657   MCHC 31.6 03/06/2023 0657   RDW 13.4 03/06/2023 0657   LYMPHSABS 1.1 03/02/2023 1041   MONOABS 0.8 03/02/2023 1041   EOSABS 0.0 03/02/2023 1041   BASOSABS 0.0 03/02/2023 1041   CMP    Component Value Date/Time   NA 136 03/06/2023 0657   K 4.1 03/06/2023 0657   CL 104 03/06/2023 0657   CO2 26 03/06/2023 0657   GLUCOSE 131 (H) 03/06/2023 0657   BUN 16 03/06/2023 0657   CREATININE 0.75 03/06/2023 0657   CREATININE 0.91 04/06/2020 0851   CALCIUM 7.9 (L) 03/06/2023 0657   PROT 5.3 (L) 03/02/2023 1215   ALBUMIN 2.6 (L) 03/02/2023 1215   AST 27 03/02/2023 1215   ALT 32 03/02/2023 1215   ALKPHOS 63 03/02/2023 1215   BILITOT 0.5 03/02/2023 1215   GFRNONAA >60 03/06/2023 0657   GFRNONAA 81 04/06/2020 0851   GFRAA 94 04/06/2020 0851   COAGS Lab Results  Component Value Date   INR 1.1 03/02/2023   INR 1.0  Stroke Discharge Summary  Patient ID: Robert [name]       MRN: [id number]      DOB: 26-Jan-1941  Date of Admission: 03/02/2023 Date of Discharge: 03/06/2023  Attending Physician:  stroke MD Consultant(s):     Interventional radiology   Patient's PCP:  Nelwyn Salisbury, MD  DISCHARGE PRIMARY DIAGNOSIS:  Stroke - Acute left BG ischemic infarct with left ICA and M1 occlusion s/p IR with TICI3 and carotid stent, likely large vessel disease   Secondary Diagnoses: Hypertension Hyperlipidemia CAD s/p CABG Prostate cancer Recent COVID infection with sepsis  Allergies as of 03/06/2023   No Known Allergies      Medication List     STOP taking these medications    dexamethasone 4 MG tablet Commonly known as: DECADRON   doxycycline 100 MG tablet Commonly known as: VIBRA-TABS       TAKE these medications    albuterol 108 (90 Base) MCG/ACT inhaler Commonly known as: VENTOLIN HFA Inhale 2 puffs into the lungs every 6 (six) hours as needed for wheezing or shortness of breath.   amLODipine 10 MG tablet Commonly known as: NORVASC Take 1 tablet (10 mg total) by mouth daily.   aspirin 81 MG tablet Take 81 mg by mouth daily.   atorvastatin 40 MG tablet Commonly known as: LIPITOR Take 1 tablet (40 mg total) by mouth daily.   Brilinta 90 MG Tabs tablet Generic drug: ticagrelor Take 1 tablet (90 mg total) by mouth 2 (two) times daily.   latanoprost 0.005 % ophthalmic solution Commonly known as: XALATAN Place 1 drop into both eyes at bedtime.   losartan 100 MG tablet Commonly known as: COZAAR Take 1 (one) tablet daily (Take 1 tablet (100 mg total) by mouth daily.)   meloxicam 15 MG tablet Commonly known as: MOBIC Take 1 (one) tablet daily (Take 1 tablet (15 mg total) by mouth daily.)   potassium chloride 10 MEQ tablet Commonly known as: KLOR-CON Take 1 tablet (10 mEq total) by mouth daily.   tamsulosin 0.4 MG Caps capsule Commonly known as: FLOMAX Take 0.4 mg by  mouth daily.        LABORATORY STUDIES CBC    Component Value Date/Time   WBC 9.6 03/06/2023 0657   RBC 3.59 (L) 03/06/2023 0657   HGB 11.1 (L) 03/06/2023 0657   HCT 35.1 (L) 03/06/2023 0657   PLT 178 03/06/2023 0657   MCV 97.8 03/06/2023 0657   MCH 30.9 03/06/2023 0657   MCHC 31.6 03/06/2023 0657   RDW 13.4 03/06/2023 0657   LYMPHSABS 1.1 03/02/2023 1041   MONOABS 0.8 03/02/2023 1041   EOSABS 0.0 03/02/2023 1041   BASOSABS 0.0 03/02/2023 1041   CMP    Component Value Date/Time   NA 136 03/06/2023 0657   K 4.1 03/06/2023 0657   CL 104 03/06/2023 0657   CO2 26 03/06/2023 0657   GLUCOSE 131 (H) 03/06/2023 0657   BUN 16 03/06/2023 0657   CREATININE 0.75 03/06/2023 0657   CREATININE 0.91 04/06/2020 0851   CALCIUM 7.9 (L) 03/06/2023 0657   PROT 5.3 (L) 03/02/2023 1215   ALBUMIN 2.6 (L) 03/02/2023 1215   AST 27 03/02/2023 1215   ALT 32 03/02/2023 1215   ALKPHOS 63 03/02/2023 1215   BILITOT 0.5 03/02/2023 1215   GFRNONAA >60 03/06/2023 0657   GFRNONAA 81 04/06/2020 0851   GFRAA 94 04/06/2020 0851   COAGS Lab Results  Component Value Date   INR 1.1 03/02/2023   INR 1.0  02/28/2023   INR 1.1 02/27/2023   Lipid Panel    Component Value Date/Time   CHOL 114 03/03/2023 0722   TRIG 86 03/06/2023 0657   HDL 17 (L) 03/03/2023 0722   CHOLHDL 6.7 03/03/2023 0722   VLDL 51 (H) 03/03/2023 0722   LDLCALC 46 03/03/2023 0722   LDLCALC 72 04/06/2020 0851   HgbA1C  Lab Results  Component Value Date   HGBA1C 6.6 (H) 03/03/2023   Alcohol Level    Component Value Date/Time   ETH <10 03/02/2023 1215     SIGNIFICANT DIAGNOSTIC STUDIES ECHOCARDIOGRAM COMPLETE  Result Date: 03/03/2023    ECHOCARDIOGRAM REPORT   Patient Name:   Robert [name] Date of Exam: 03/03/2023 Medical Rec #:  [id number]         Height:       66.0 in Accession #:    [id number]        Weight:       230.2 lb Date of Birth:  January 23, 1941         BSA:          2.123 m Patient Age:    82 years           BP:           149/57 mmHg Patient Gender: M                 HR:           53 bpm. Exam Location:  Inpatient Procedure: 2D Echo, Cardiac Doppler and Color Doppler Indications:    Stroke I63.9  History:        Patient has no prior history of Echocardiogram examinations.                 CAD, Prior CABG, Stroke; Risk Factors:Hypertension, Diabetes and                 Dyslipidemia.  Sonographer:    Lucendia Herrlich RCS Referring Phys: [phone] DENISE A WOLFE IMPRESSIONS  1. Left ventricular ejection fraction, by estimation, is 60 to 65%. The left ventricle has normal function. The left ventricle has no regional wall motion abnormalities. Left ventricular diastolic parameters are consistent with Grade I diastolic dysfunction (impaired relaxation).  2. Right ventricular systolic function is normal. The right ventricular size is normal. There is normal pulmonary artery systolic pressure.  3. The mitral valve is normal in structure. Trivial mitral valve regurgitation. No evidence of mitral stenosis. Moderate mitral annular calcification.  4. The aortic valve is calcified. There is mild calcification of the aortic valve. There is mild thickening of the aortic valve. Aortic valve regurgitation is not visualized. No aortic stenosis is present.  5. The inferior vena cava is normal in size with greater than 50% respiratory variability, suggesting right atrial pressure of 3 mmHg. FINDINGS  Left Ventricle: Left ventricular ejection fraction, by estimation, is 60 to 65%. The left ventricle has normal function. The left ventricle has no regional wall motion abnormalities. The left ventricular internal cavity size was normal in size. There is  no left ventricular hypertrophy. Left ventricular diastolic parameters are consistent with Grade I diastolic dysfunction (impaired relaxation). Indeterminate filling pressures. Right Ventricle: The right ventricular size is normal. No increase in right ventricular wall thickness. Right ventricular  systolic function is normal. There is normal pulmonary artery systolic pressure. The tricuspid regurgitant velocity is 2.67 m/s, and  with an assumed right atrial pressure of 3 mmHg, the estimated right ventricular systolic pressure  02/28/2023   INR 1.1 02/27/2023   Lipid Panel    Component Value Date/Time   CHOL 114 03/03/2023 0722   TRIG 86 03/06/2023 0657   HDL 17 (L) 03/03/2023 0722   CHOLHDL 6.7 03/03/2023 0722   VLDL 51 (H) 03/03/2023 0722   LDLCALC 46 03/03/2023 0722   LDLCALC 72 04/06/2020 0851   HgbA1C  Lab Results  Component Value Date   HGBA1C 6.6 (H) 03/03/2023   Alcohol Level    Component Value Date/Time   ETH <10 03/02/2023 1215     SIGNIFICANT DIAGNOSTIC STUDIES ECHOCARDIOGRAM COMPLETE  Result Date: 03/03/2023    ECHOCARDIOGRAM REPORT   Patient Name:   Robert [name] Date of Exam: 03/03/2023 Medical Rec #:  [id number]         Height:       66.0 in Accession #:    [id number]        Weight:       230.2 lb Date of Birth:  January 23, 1941         BSA:          2.123 m Patient Age:    82 years           BP:           149/57 mmHg Patient Gender: M                 HR:           53 bpm. Exam Location:  Inpatient Procedure: 2D Echo, Cardiac Doppler and Color Doppler Indications:    Stroke I63.9  History:        Patient has no prior history of Echocardiogram examinations.                 CAD, Prior CABG, Stroke; Risk Factors:Hypertension, Diabetes and                 Dyslipidemia.  Sonographer:    Lucendia Herrlich RCS Referring Phys: [phone] DENISE A WOLFE IMPRESSIONS  1. Left ventricular ejection fraction, by estimation, is 60 to 65%. The left ventricle has normal function. The left ventricle has no regional wall motion abnormalities. Left ventricular diastolic parameters are consistent with Grade I diastolic dysfunction (impaired relaxation).  2. Right ventricular systolic function is normal. The right ventricular size is normal. There is normal pulmonary artery systolic pressure.  3. The mitral valve is normal in structure. Trivial mitral valve regurgitation. No evidence of mitral stenosis. Moderate mitral annular calcification.  4. The aortic valve is calcified. There is mild calcification of the aortic valve. There is mild thickening of the aortic valve. Aortic valve regurgitation is not visualized. No aortic stenosis is present.  5. The inferior vena cava is normal in size with greater than 50% respiratory variability, suggesting right atrial pressure of 3 mmHg. FINDINGS  Left Ventricle: Left ventricular ejection fraction, by estimation, is 60 to 65%. The left ventricle has normal function. The left ventricle has no regional wall motion abnormalities. The left ventricular internal cavity size was normal in size. There is  no left ventricular hypertrophy. Left ventricular diastolic parameters are consistent with Grade I diastolic dysfunction (impaired relaxation). Indeterminate filling pressures. Right Ventricle: The right ventricular size is normal. No increase in right ventricular wall thickness. Right ventricular  systolic function is normal. There is normal pulmonary artery systolic pressure. The tricuspid regurgitant velocity is 2.67 m/s, and  with an assumed right atrial pressure of 3 mmHg, the estimated right ventricular systolic pressure  02/28/2023   INR 1.1 02/27/2023   Lipid Panel    Component Value Date/Time   CHOL 114 03/03/2023 0722   TRIG 86 03/06/2023 0657   HDL 17 (L) 03/03/2023 0722   CHOLHDL 6.7 03/03/2023 0722   VLDL 51 (H) 03/03/2023 0722   LDLCALC 46 03/03/2023 0722   LDLCALC 72 04/06/2020 0851   HgbA1C  Lab Results  Component Value Date   HGBA1C 6.6 (H) 03/03/2023   Alcohol Level    Component Value Date/Time   ETH <10 03/02/2023 1215     SIGNIFICANT DIAGNOSTIC STUDIES ECHOCARDIOGRAM COMPLETE  Result Date: 03/03/2023    ECHOCARDIOGRAM REPORT   Patient Name:   Robert [name] Date of Exam: 03/03/2023 Medical Rec #:  [id number]         Height:       66.0 in Accession #:    [id number]        Weight:       230.2 lb Date of Birth:  January 23, 1941         BSA:          2.123 m Patient Age:    82 years           BP:           149/57 mmHg Patient Gender: M                 HR:           53 bpm. Exam Location:  Inpatient Procedure: 2D Echo, Cardiac Doppler and Color Doppler Indications:    Stroke I63.9  History:        Patient has no prior history of Echocardiogram examinations.                 CAD, Prior CABG, Stroke; Risk Factors:Hypertension, Diabetes and                 Dyslipidemia.  Sonographer:    Lucendia Herrlich RCS Referring Phys: [phone] DENISE A WOLFE IMPRESSIONS  1. Left ventricular ejection fraction, by estimation, is 60 to 65%. The left ventricle has normal function. The left ventricle has no regional wall motion abnormalities. Left ventricular diastolic parameters are consistent with Grade I diastolic dysfunction (impaired relaxation).  2. Right ventricular systolic function is normal. The right ventricular size is normal. There is normal pulmonary artery systolic pressure.  3. The mitral valve is normal in structure. Trivial mitral valve regurgitation. No evidence of mitral stenosis. Moderate mitral annular calcification.  4. The aortic valve is calcified. There is mild calcification of the aortic valve. There is mild thickening of the aortic valve. Aortic valve regurgitation is not visualized. No aortic stenosis is present.  5. The inferior vena cava is normal in size with greater than 50% respiratory variability, suggesting right atrial pressure of 3 mmHg. FINDINGS  Left Ventricle: Left ventricular ejection fraction, by estimation, is 60 to 65%. The left ventricle has normal function. The left ventricle has no regional wall motion abnormalities. The left ventricular internal cavity size was normal in size. There is  no left ventricular hypertrophy. Left ventricular diastolic parameters are consistent with Grade I diastolic dysfunction (impaired relaxation). Indeterminate filling pressures. Right Ventricle: The right ventricular size is normal. No increase in right ventricular wall thickness. Right ventricular  systolic function is normal. There is normal pulmonary artery systolic pressure. The tricuspid regurgitant velocity is 2.67 m/s, and  with an assumed right atrial pressure of 3 mmHg, the estimated right ventricular systolic pressure  Stroke Discharge Summary  Patient ID: Robert [name]       MRN: [id number]      DOB: 26-Jan-1941  Date of Admission: 03/02/2023 Date of Discharge: 03/06/2023  Attending Physician:  stroke MD Consultant(s):     Interventional radiology   Patient's PCP:  Nelwyn Salisbury, MD  DISCHARGE PRIMARY DIAGNOSIS:  Stroke - Acute left BG ischemic infarct with left ICA and M1 occlusion s/p IR with TICI3 and carotid stent, likely large vessel disease   Secondary Diagnoses: Hypertension Hyperlipidemia CAD s/p CABG Prostate cancer Recent COVID infection with sepsis  Allergies as of 03/06/2023   No Known Allergies      Medication List     STOP taking these medications    dexamethasone 4 MG tablet Commonly known as: DECADRON   doxycycline 100 MG tablet Commonly known as: VIBRA-TABS       TAKE these medications    albuterol 108 (90 Base) MCG/ACT inhaler Commonly known as: VENTOLIN HFA Inhale 2 puffs into the lungs every 6 (six) hours as needed for wheezing or shortness of breath.   amLODipine 10 MG tablet Commonly known as: NORVASC Take 1 tablet (10 mg total) by mouth daily.   aspirin 81 MG tablet Take 81 mg by mouth daily.   atorvastatin 40 MG tablet Commonly known as: LIPITOR Take 1 tablet (40 mg total) by mouth daily.   Brilinta 90 MG Tabs tablet Generic drug: ticagrelor Take 1 tablet (90 mg total) by mouth 2 (two) times daily.   latanoprost 0.005 % ophthalmic solution Commonly known as: XALATAN Place 1 drop into both eyes at bedtime.   losartan 100 MG tablet Commonly known as: COZAAR Take 1 (one) tablet daily (Take 1 tablet (100 mg total) by mouth daily.)   meloxicam 15 MG tablet Commonly known as: MOBIC Take 1 (one) tablet daily (Take 1 tablet (15 mg total) by mouth daily.)   potassium chloride 10 MEQ tablet Commonly known as: KLOR-CON Take 1 tablet (10 mEq total) by mouth daily.   tamsulosin 0.4 MG Caps capsule Commonly known as: FLOMAX Take 0.4 mg by  mouth daily.        LABORATORY STUDIES CBC    Component Value Date/Time   WBC 9.6 03/06/2023 0657   RBC 3.59 (L) 03/06/2023 0657   HGB 11.1 (L) 03/06/2023 0657   HCT 35.1 (L) 03/06/2023 0657   PLT 178 03/06/2023 0657   MCV 97.8 03/06/2023 0657   MCH 30.9 03/06/2023 0657   MCHC 31.6 03/06/2023 0657   RDW 13.4 03/06/2023 0657   LYMPHSABS 1.1 03/02/2023 1041   MONOABS 0.8 03/02/2023 1041   EOSABS 0.0 03/02/2023 1041   BASOSABS 0.0 03/02/2023 1041   CMP    Component Value Date/Time   NA 136 03/06/2023 0657   K 4.1 03/06/2023 0657   CL 104 03/06/2023 0657   CO2 26 03/06/2023 0657   GLUCOSE 131 (H) 03/06/2023 0657   BUN 16 03/06/2023 0657   CREATININE 0.75 03/06/2023 0657   CREATININE 0.91 04/06/2020 0851   CALCIUM 7.9 (L) 03/06/2023 0657   PROT 5.3 (L) 03/02/2023 1215   ALBUMIN 2.6 (L) 03/02/2023 1215   AST 27 03/02/2023 1215   ALT 32 03/02/2023 1215   ALKPHOS 63 03/02/2023 1215   BILITOT 0.5 03/02/2023 1215   GFRNONAA >60 03/06/2023 0657   GFRNONAA 81 04/06/2020 0851   GFRAA 94 04/06/2020 0851   COAGS Lab Results  Component Value Date   INR 1.1 03/02/2023   INR 1.0  Stroke Discharge Summary  Patient ID: Robert [name]       MRN: [id number]      DOB: 26-Jan-1941  Date of Admission: 03/02/2023 Date of Discharge: 03/06/2023  Attending Physician:  stroke MD Consultant(s):     Interventional radiology   Patient's PCP:  Nelwyn Salisbury, MD  DISCHARGE PRIMARY DIAGNOSIS:  Stroke - Acute left BG ischemic infarct with left ICA and M1 occlusion s/p IR with TICI3 and carotid stent, likely large vessel disease   Secondary Diagnoses: Hypertension Hyperlipidemia CAD s/p CABG Prostate cancer Recent COVID infection with sepsis  Allergies as of 03/06/2023   No Known Allergies      Medication List     STOP taking these medications    dexamethasone 4 MG tablet Commonly known as: DECADRON   doxycycline 100 MG tablet Commonly known as: VIBRA-TABS       TAKE these medications    albuterol 108 (90 Base) MCG/ACT inhaler Commonly known as: VENTOLIN HFA Inhale 2 puffs into the lungs every 6 (six) hours as needed for wheezing or shortness of breath.   amLODipine 10 MG tablet Commonly known as: NORVASC Take 1 tablet (10 mg total) by mouth daily.   aspirin 81 MG tablet Take 81 mg by mouth daily.   atorvastatin 40 MG tablet Commonly known as: LIPITOR Take 1 tablet (40 mg total) by mouth daily.   Brilinta 90 MG Tabs tablet Generic drug: ticagrelor Take 1 tablet (90 mg total) by mouth 2 (two) times daily.   latanoprost 0.005 % ophthalmic solution Commonly known as: XALATAN Place 1 drop into both eyes at bedtime.   losartan 100 MG tablet Commonly known as: COZAAR Take 1 (one) tablet daily (Take 1 tablet (100 mg total) by mouth daily.)   meloxicam 15 MG tablet Commonly known as: MOBIC Take 1 (one) tablet daily (Take 1 tablet (15 mg total) by mouth daily.)   potassium chloride 10 MEQ tablet Commonly known as: KLOR-CON Take 1 tablet (10 mEq total) by mouth daily.   tamsulosin 0.4 MG Caps capsule Commonly known as: FLOMAX Take 0.4 mg by  mouth daily.        LABORATORY STUDIES CBC    Component Value Date/Time   WBC 9.6 03/06/2023 0657   RBC 3.59 (L) 03/06/2023 0657   HGB 11.1 (L) 03/06/2023 0657   HCT 35.1 (L) 03/06/2023 0657   PLT 178 03/06/2023 0657   MCV 97.8 03/06/2023 0657   MCH 30.9 03/06/2023 0657   MCHC 31.6 03/06/2023 0657   RDW 13.4 03/06/2023 0657   LYMPHSABS 1.1 03/02/2023 1041   MONOABS 0.8 03/02/2023 1041   EOSABS 0.0 03/02/2023 1041   BASOSABS 0.0 03/02/2023 1041   CMP    Component Value Date/Time   NA 136 03/06/2023 0657   K 4.1 03/06/2023 0657   CL 104 03/06/2023 0657   CO2 26 03/06/2023 0657   GLUCOSE 131 (H) 03/06/2023 0657   BUN 16 03/06/2023 0657   CREATININE 0.75 03/06/2023 0657   CREATININE 0.91 04/06/2020 0851   CALCIUM 7.9 (L) 03/06/2023 0657   PROT 5.3 (L) 03/02/2023 1215   ALBUMIN 2.6 (L) 03/02/2023 1215   AST 27 03/02/2023 1215   ALT 32 03/02/2023 1215   ALKPHOS 63 03/02/2023 1215   BILITOT 0.5 03/02/2023 1215   GFRNONAA >60 03/06/2023 0657   GFRNONAA 81 04/06/2020 0851   GFRAA 94 04/06/2020 0851   COAGS Lab Results  Component Value Date   INR 1.1 03/02/2023   INR 1.0  Stroke Discharge Summary  Patient ID: Robert [name]       MRN: [id number]      DOB: 26-Jan-1941  Date of Admission: 03/02/2023 Date of Discharge: 03/06/2023  Attending Physician:  stroke MD Consultant(s):     Interventional radiology   Patient's PCP:  Nelwyn Salisbury, MD  DISCHARGE PRIMARY DIAGNOSIS:  Stroke - Acute left BG ischemic infarct with left ICA and M1 occlusion s/p IR with TICI3 and carotid stent, likely large vessel disease   Secondary Diagnoses: Hypertension Hyperlipidemia CAD s/p CABG Prostate cancer Recent COVID infection with sepsis  Allergies as of 03/06/2023   No Known Allergies      Medication List     STOP taking these medications    dexamethasone 4 MG tablet Commonly known as: DECADRON   doxycycline 100 MG tablet Commonly known as: VIBRA-TABS       TAKE these medications    albuterol 108 (90 Base) MCG/ACT inhaler Commonly known as: VENTOLIN HFA Inhale 2 puffs into the lungs every 6 (six) hours as needed for wheezing or shortness of breath.   amLODipine 10 MG tablet Commonly known as: NORVASC Take 1 tablet (10 mg total) by mouth daily.   aspirin 81 MG tablet Take 81 mg by mouth daily.   atorvastatin 40 MG tablet Commonly known as: LIPITOR Take 1 tablet (40 mg total) by mouth daily.   Brilinta 90 MG Tabs tablet Generic drug: ticagrelor Take 1 tablet (90 mg total) by mouth 2 (two) times daily.   latanoprost 0.005 % ophthalmic solution Commonly known as: XALATAN Place 1 drop into both eyes at bedtime.   losartan 100 MG tablet Commonly known as: COZAAR Take 1 (one) tablet daily (Take 1 tablet (100 mg total) by mouth daily.)   meloxicam 15 MG tablet Commonly known as: MOBIC Take 1 (one) tablet daily (Take 1 tablet (15 mg total) by mouth daily.)   potassium chloride 10 MEQ tablet Commonly known as: KLOR-CON Take 1 tablet (10 mEq total) by mouth daily.   tamsulosin 0.4 MG Caps capsule Commonly known as: FLOMAX Take 0.4 mg by  mouth daily.        LABORATORY STUDIES CBC    Component Value Date/Time   WBC 9.6 03/06/2023 0657   RBC 3.59 (L) 03/06/2023 0657   HGB 11.1 (L) 03/06/2023 0657   HCT 35.1 (L) 03/06/2023 0657   PLT 178 03/06/2023 0657   MCV 97.8 03/06/2023 0657   MCH 30.9 03/06/2023 0657   MCHC 31.6 03/06/2023 0657   RDW 13.4 03/06/2023 0657   LYMPHSABS 1.1 03/02/2023 1041   MONOABS 0.8 03/02/2023 1041   EOSABS 0.0 03/02/2023 1041   BASOSABS 0.0 03/02/2023 1041   CMP    Component Value Date/Time   NA 136 03/06/2023 0657   K 4.1 03/06/2023 0657   CL 104 03/06/2023 0657   CO2 26 03/06/2023 0657   GLUCOSE 131 (H) 03/06/2023 0657   BUN 16 03/06/2023 0657   CREATININE 0.75 03/06/2023 0657   CREATININE 0.91 04/06/2020 0851   CALCIUM 7.9 (L) 03/06/2023 0657   PROT 5.3 (L) 03/02/2023 1215   ALBUMIN 2.6 (L) 03/02/2023 1215   AST 27 03/02/2023 1215   ALT 32 03/02/2023 1215   ALKPHOS 63 03/02/2023 1215   BILITOT 0.5 03/02/2023 1215   GFRNONAA >60 03/06/2023 0657   GFRNONAA 81 04/06/2020 0851   GFRAA 94 04/06/2020 0851   COAGS Lab Results  Component Value Date   INR 1.1 03/02/2023   INR 1.0

## 2023-03-06 NOTE — Consult Note (Signed)
[facility] Inpatient Consult   03/06/2023  KUTTER SCHNEPF [birth year] [id number]  [facility] [[facility]]  Accountable Care Organization [ACO] Patient: [employer]  Primary Care Provider: Nelwyn Salisbury, MD with [place] [facility] provider is listed for the transition of care follow up appointments and calls  The patient was screened for 7 day readmission hospitalization with noted medium risk score for unplanned readmission risk 2 hospital admissions in 6 months.  The patient was assessed for potential [facility]) Care Management service needs for post hospital transition for care coordination. Review of patient's electronic medical record reveals patient is admitted with Acute Ischemic MCA, with COVID positive.  Remote to preserve hospital PPE.  Plan:   Referral request for community care coordination: anticipate follow up from [facility] RN   Community Care Management/Population Health does not replace or interfere with any arrangements made by the Inpatient Transition of Care team.   For questions contact:   Charlesetta Shanks, RN, BSN, CCM [place]  [facility], [facility] Liaison Direct Dial: [phone] or secure chat Website: [email or web address]

## 2023-03-06 NOTE — Progress Notes (Signed)
Occupational Therapy Treatment Patient Details Name: Robert [name] MRN: [id number] DOB: [birth year] Today's Date: 03/06/2023   History of present illness The pt is an 82 yo male presenting 9/26 with aphasia and inability to name objects. Work up revealed occlusion of L ICA and distal M1, was outside TPA window, taken to IR. S/p thrombectomy 9/26 (R femoral approach), complicated by delayed carotid occlusion requiring stenting and cangrelor. Intubated 9/26-9/27. PMH includes: recent admission for sepsis due to COVID 9/23-9/25, CAD s/p CABG, HTN, and HLD.   OT comments  Patient continues to make steady progress towards goals in skilled OT session. Patient with improved ability to complete functional mobility, toilet transfers, and ADLs. Patient is min A for ADL management, and CGA for functional mobility and able to complete household distances. OT recommendation upgraded to [facility] due to patient's progress, with education provided with regard to energy conservation, pacing with transfers, and fall prevention. Wife present for session and feels confident in being able to manage at home in addition to patient. OT will continue to follow acutely.       If plan is discharge home, recommend the following:  A little help with walking and/or transfers;A little help with bathing/dressing/bathroom;Assistance with cooking/housework;Help with stairs or ramp for entrance;Assist for transportation;Direct supervision/assist for financial management;Direct supervision/assist for medications management   Equipment Recommendations  Other (comment) Adult nurse)    Recommendations for Other Services      Precautions / Restrictions Precautions Precautions: Fall Restrictions Weight Bearing Restrictions: No       Mobility Bed Mobility               General bed mobility comments: pt OOB in recliner at start and end of session    Transfers Overall transfer level: Needs assistance Equipment used:  Rolling walker (2 wheels) Transfers: Sit to/from Stand Sit to Stand: Contact guard assist, Min assist           General transfer comment: minA with HHA, CGA from recliner. cues for hand placement on armrests, benefitting from using RW     Balance Overall balance assessment: Needs assistance Sitting-balance support: Feet unsupported Sitting balance-Leahy Scale: Fair Sitting balance - Comments: no LOB with static sitting   Standing balance support: Bilateral upper extremity supported, Single extremity supported, During functional activity Standing balance-Leahy Scale: Fair Standing balance comment: best stability with BUE support, poor tolerance of challenge                           ADL either performed or assessed with clinical judgement   ADL Overall ADL's : Needs assistance/impaired     Grooming: Wash/dry hands;Wash/dry face;Oral care;Set up;Standing                   Toilet Transfer: Minimal assistance;Ambulation;Rolling walker (2 wheels) Toilet Transfer Details (indicate cue type and reason): improved indpendence with RW         Functional mobility during ADLs: Minimal assistance;Cueing for safety;Cueing for sequencing;Rolling walker (2 wheels) General ADL Comments: Patient with improved ability to complete functional mobility, toilet transfers, and ADLs. Patient is min A for ADL management, and CGA for functional mobility and able to complete household distances. OT recommendation upgraded to [facility] due to patient's progress, with education provided with regard to energy conservation, pacing with transfers, and fall prevention. Wife present for session and feels confident in being able to manage at home in addition to patient. OT will continue to follow acutely.  Extremity/Trunk Assessment              Vision       Perception     Praxis      Cognition Arousal: Alert Behavior During Therapy: Impulsive Overall Cognitive Status:  Impaired/Different from baseline Area of Impairment: Memory, Safety/judgement                     Memory: Decreased short-term memory   Safety/Judgement: Decreased awareness of deficits, Decreased awareness of safety     General Comments: Pt alert and oriented to place, time, and situation. At times limited by [facility], pt also with poor STM multiple times in session but responds well to simple instructions. Patient more impulsive as he is very motivated to go home, but receptive to feedback and understands the gravity of rushing in his home environment as it can lead to falls.        Exercises      Shoulder Instructions       General Comments VSS    Pertinent Vitals/ Pain       Pain Assessment Pain Assessment: No/denies pain  Home Living                                          Prior Functioning/Environment              Frequency  Min 1X/week        Progress Toward Goals  OT Goals(current goals can now be found in the care plan section)  Progress towards OT goals: Progressing toward goals  Acute Rehab OT Goals Patient Stated Goal: to go home OT Goal Formulation: With patient/family Time For Goal Achievement: 03/18/23 Potential to Achieve Goals: Good  Plan      Co-evaluation                 AM-PAC OT "6 Clicks" Daily Activity     Outcome Measure   Help from another person eating meals?: None Help from another person taking care of personal grooming?: A Little Help from another person toileting, which includes using toliet, bedpan, or urinal?: A Little Help from another person bathing (including washing, rinsing, drying)?: A Little Help from another person to put on and taking off regular upper body clothing?: A Little Help from another person to put on and taking off regular lower body clothing?: A Lot 6 Click Score: 18    End of Session Equipment Utilized During Treatment: Gait belt;Rolling walker (2 wheels)  OT Visit  Diagnosis: Unsteadiness on feet (R26.81);Muscle weakness (generalized) (M62.81);Other symptoms and signs involving cognitive function;Other abnormalities of gait and mobility (R26.89)   Activity Tolerance Patient tolerated treatment well   Patient Left in chair;with call bell/phone within reach;with family/visitor present;with chair alarm set   Nurse Communication Mobility status        Time: 5366-4403 OT Time Calculation (min): 21 min  Charges: OT General Charges $OT Visit: 1 Visit OT Treatments $Self Care/Home Management : 8-22 mins  Pollyann Glen E. Cybele Maule, OTR/L Acute Rehabilitation Services [phone]   Cherlyn Cushing 03/06/2023, 12:05 PM

## 2023-03-06 NOTE — Progress Notes (Signed)
Inpatient Rehab Admissions Coordinator:    Patient appears to be improving and requesting to go home. PT has updated discharge rec to home with home health PT. Will sign off at this time.    Rehab Admissons Coordinator [place], [place], [place] [phone]

## 2023-03-07 ENCOUNTER — Telehealth: Payer: Self-pay | Admitting: Family Medicine

## 2023-03-07 DIAGNOSIS — Z951 Presence of aortocoronary bypass graft: Secondary | ICD-10-CM | POA: Diagnosis not present

## 2023-03-07 DIAGNOSIS — Z7982 Long term (current) use of aspirin: Secondary | ICD-10-CM | POA: Diagnosis not present

## 2023-03-07 DIAGNOSIS — Z7901 Long term (current) use of anticoagulants: Secondary | ICD-10-CM | POA: Diagnosis not present

## 2023-03-07 DIAGNOSIS — U071 COVID-19: Secondary | ICD-10-CM | POA: Diagnosis not present

## 2023-03-07 DIAGNOSIS — N403 Nodular prostate with lower urinary tract symptoms: Secondary | ICD-10-CM | POA: Diagnosis not present

## 2023-03-07 DIAGNOSIS — I2510 Atherosclerotic heart disease of native coronary artery without angina pectoris: Secondary | ICD-10-CM | POA: Diagnosis not present

## 2023-03-07 DIAGNOSIS — I69320 Aphasia following cerebral infarction: Secondary | ICD-10-CM | POA: Diagnosis not present

## 2023-03-07 DIAGNOSIS — I119 Hypertensive heart disease without heart failure: Secondary | ICD-10-CM | POA: Diagnosis not present

## 2023-03-07 DIAGNOSIS — E785 Hyperlipidemia, unspecified: Secondary | ICD-10-CM | POA: Diagnosis not present

## 2023-03-07 DIAGNOSIS — Z8546 Personal history of malignant neoplasm of prostate: Secondary | ICD-10-CM | POA: Diagnosis not present

## 2023-03-07 NOTE — Addendum Note (Signed)
Addendum  created 03/07/23 1447 by [place] Nation, MD   Attestation recorded in Intraprocedure, Intraprocedure Attestations filed

## 2023-03-07 NOTE — Telephone Encounter (Signed)
Betsy PT with [facility] is calling for VO 2X3 then 1x1

## 2023-03-08 LAB — GLUCOSE, CAPILLARY: Glucose-Capillary: 119 mg/dL — ABNORMAL HIGH (ref 70–99)

## 2023-03-09 ENCOUNTER — Telehealth (HOSPITAL_COMMUNITY): Payer: Self-pay | Admitting: Radiology

## 2023-03-09 ENCOUNTER — Telehealth: Payer: Self-pay | Admitting: Family Medicine

## 2023-03-09 NOTE — Telephone Encounter (Signed)
Please okay the orders  

## 2023-03-09 NOTE — Telephone Encounter (Signed)
Will, OT with [facility]  [phone] Ok to leave a detailed message on this line.   Verbal Order - OT   2x a wk for 2 wks

## 2023-03-09 NOTE — Telephone Encounter (Signed)
Spoke with Tamela Oddi with [facility] advised that VO requested have been approved

## 2023-03-09 NOTE — Telephone Encounter (Signed)
Returned call to pt's wife. She wants to know what the f/u will be for Robert [name] with Dr. Quay Burow. I will speak with Robert [name] and get back to them. She agrees with this plan of care. JM

## 2023-03-10 NOTE — Telephone Encounter (Signed)
Please okay the orders  

## 2023-03-10 NOTE — Telephone Encounter (Signed)
Left detailed message with approval of the VO requested

## 2023-03-14 ENCOUNTER — Other Ambulatory Visit (HOSPITAL_COMMUNITY): Payer: Self-pay

## 2023-03-14 ENCOUNTER — Other Ambulatory Visit: Payer: Self-pay

## 2023-03-14 ENCOUNTER — Encounter: Payer: Self-pay | Admitting: Family Medicine

## 2023-03-14 ENCOUNTER — Ambulatory Visit (INDEPENDENT_AMBULATORY_CARE_PROVIDER_SITE_OTHER): Payer: PPO | Admitting: Family Medicine

## 2023-03-14 VITALS — BP 134/72 | HR 80 | Temp 97.4°F | Wt 211.4 lb

## 2023-03-14 DIAGNOSIS — M25471 Effusion, right ankle: Secondary | ICD-10-CM | POA: Insufficient documentation

## 2023-03-14 DIAGNOSIS — I10 Essential (primary) hypertension: Secondary | ICD-10-CM

## 2023-03-14 DIAGNOSIS — Z8673 Personal history of transient ischemic attack (TIA), and cerebral infarction without residual deficits: Secondary | ICD-10-CM

## 2023-03-14 DIAGNOSIS — N401 Enlarged prostate with lower urinary tract symptoms: Secondary | ICD-10-CM | POA: Diagnosis not present

## 2023-03-14 DIAGNOSIS — M25472 Effusion, left ankle: Secondary | ICD-10-CM | POA: Diagnosis not present

## 2023-03-14 DIAGNOSIS — I63232 Cerebral infarction due to unspecified occlusion or stenosis of left carotid arteries: Secondary | ICD-10-CM

## 2023-03-14 DIAGNOSIS — N138 Other obstructive and reflux uropathy: Secondary | ICD-10-CM | POA: Diagnosis not present

## 2023-03-14 MED ORDER — METOPROLOL SUCCINATE ER 50 MG PO TB24
50.0000 mg | ORAL_TABLET | Freq: Every day | ORAL | 3 refills | Status: DC
  Filled 2023-03-14: qty 30, 30d supply, fill #0
  Filled 2023-04-01: qty 30, 30d supply, fill #1

## 2023-03-14 MED ORDER — ATORVASTATIN CALCIUM 40 MG PO TABS
40.0000 mg | ORAL_TABLET | Freq: Every day | ORAL | 3 refills | Status: DC
  Filled 2023-03-14: qty 90, 90d supply, fill #0
  Filled 2023-06-26: qty 90, 90d supply, fill #1

## 2023-03-14 MED ORDER — TAMSULOSIN HCL 0.4 MG PO CAPS
0.8000 mg | ORAL_CAPSULE | Freq: Every day | ORAL | 3 refills | Status: DC
  Filled 2023-03-14 – 2023-04-12 (×4): qty 180, 90d supply, fill #0

## 2023-03-14 NOTE — Progress Notes (Signed)
Subjective:    Patient ID: Robert [name], male    DOB: 11-Aug-1940, 82 y.o.   MRN: [id number]  HPI Here with his wife to follow up a hospital stay from 03-02-23 to 03-06-23 for a left basal ganglia ischemic stroke. He presented with the acute onset of slurred speech and inability to follow commands. At the ED his EKG and CXR were unremarkable. His BP was controlled. His labs were unremarkable (creatinine was 0.75). AN ECHO showed an EF of 60-65% with Grade I diastolic dysfunction. No major valvular issues were seen. A head CT and a brain MRA showed the acute ischemic changes as well as a thrombus in the left ICA. He underwent an immediate thrombectomy and a stent was placed in the LICA. His neurologic deficits fairly quickly resolved. Since going home he feels well except for some generalized fatigue. He is scheduled to begin PT and OT later this week. His BP has been well controlled with systolic readings in the 130's and 140's. He does mention that he started to have swelling in both ankles about 2 months ago after we increase the Amlodipine to 10 mg daily. No SOB. He also mentions that he continues to have trouble with passing urine and he has incontinence in bed. He wears protective undergarments to bed. He has been taking Tamsulosin 0.4 mg each evening per his urologist, Dr. Wilson Singer.    Review of Systems  Constitutional:  Positive for fatigue.  Respiratory: Negative.    Cardiovascular:  Positive for leg swelling. Negative for chest pain and palpitations.  Gastrointestinal: Negative.   Genitourinary:  Positive for difficulty urinating. Negative for dysuria, frequency and hematuria.  Neurological: Negative.        Objective:   Physical Exam Constitutional:      Appearance: Normal appearance.  Cardiovascular:     Rate and Rhythm: Normal rate and regular rhythm.     Pulses: Normal pulses.     Heart sounds: Normal heart sounds.  Pulmonary:     Effort: Pulmonary effort is normal.      Breath sounds: Normal breath sounds.  Musculoskeletal:     Comments: He has 1+ edema in both ankles   Neurological:     Mental Status: He is alert and oriented to person, place, and time. Mental status is at baseline.     Coordination: Coordination normal.     Gait: Gait normal.           Assessment & Plan:  He is recovering from a stroke, and he is doing well overall. He will start PT and OT this week. He will take ASA 81 mg and Brilinta daily for 3  months. He will follow up with Neurology on 04-10-23. His ankle edema is likely aside effect of the Amlodipine, so we will stop this. Instead he will start Metoprolol succinate 50 mg daily, and he will continue the Losartan as before. He will follow up with [place] in 3 weeks. Finally he will increase the Tamsulosin to 2 pills ( 0.8 mg) every night for the BPH issues. We spent a total of ( 34  ) minutes reviewing records and discussing these issues.  Gershon Crane, MD

## 2023-03-15 DIAGNOSIS — E785 Hyperlipidemia, unspecified: Secondary | ICD-10-CM | POA: Diagnosis not present

## 2023-03-15 DIAGNOSIS — E669 Obesity, unspecified: Secondary | ICD-10-CM | POA: Diagnosis not present

## 2023-03-15 DIAGNOSIS — I119 Hypertensive heart disease without heart failure: Secondary | ICD-10-CM | POA: Diagnosis not present

## 2023-03-15 DIAGNOSIS — I69320 Aphasia following cerebral infarction: Secondary | ICD-10-CM | POA: Diagnosis not present

## 2023-03-15 DIAGNOSIS — Z7901 Long term (current) use of anticoagulants: Secondary | ICD-10-CM | POA: Diagnosis not present

## 2023-03-15 DIAGNOSIS — I2510 Atherosclerotic heart disease of native coronary artery without angina pectoris: Secondary | ICD-10-CM | POA: Diagnosis not present

## 2023-03-15 DIAGNOSIS — Z951 Presence of aortocoronary bypass graft: Secondary | ICD-10-CM | POA: Diagnosis not present

## 2023-03-15 DIAGNOSIS — E1140 Type 2 diabetes mellitus with diabetic neuropathy, unspecified: Secondary | ICD-10-CM | POA: Diagnosis not present

## 2023-03-15 DIAGNOSIS — Z8546 Personal history of malignant neoplasm of prostate: Secondary | ICD-10-CM | POA: Diagnosis not present

## 2023-03-15 DIAGNOSIS — N403 Nodular prostate with lower urinary tract symptoms: Secondary | ICD-10-CM | POA: Diagnosis not present

## 2023-03-15 DIAGNOSIS — U071 COVID-19: Secondary | ICD-10-CM | POA: Diagnosis not present

## 2023-03-15 DIAGNOSIS — Z7982 Long term (current) use of aspirin: Secondary | ICD-10-CM | POA: Diagnosis not present

## 2023-03-23 ENCOUNTER — Telehealth: Payer: Self-pay | Admitting: Family Medicine

## 2023-03-23 NOTE — Telephone Encounter (Addendum)
Robert [name] // OT with [facility] [phone] [facility] to leave a detailed message on this line  Pt discharged today from OT  FYI:  Vitals  This a.m:   OT reports that as per Pt: *BP higher than usual:   First check: 188/85  2nd Check:  182/85

## 2023-03-28 ENCOUNTER — Other Ambulatory Visit (HOSPITAL_COMMUNITY): Payer: Self-pay

## 2023-03-28 ENCOUNTER — Other Ambulatory Visit: Payer: Self-pay

## 2023-03-29 ENCOUNTER — Other Ambulatory Visit (HOSPITAL_COMMUNITY): Payer: Self-pay

## 2023-04-01 ENCOUNTER — Other Ambulatory Visit (HOSPITAL_COMMUNITY): Payer: Self-pay

## 2023-04-03 ENCOUNTER — Other Ambulatory Visit: Payer: Self-pay

## 2023-04-03 ENCOUNTER — Telehealth: Payer: Self-pay | Admitting: Family Medicine

## 2023-04-03 NOTE — Telephone Encounter (Signed)
Spouse called to inform MD that Pt had a stroke recently and his BP has been running a bit high. Spouse is wondering if MD should increase the dosage of his BP medications?

## 2023-04-04 ENCOUNTER — Ambulatory Visit (INDEPENDENT_AMBULATORY_CARE_PROVIDER_SITE_OTHER): Payer: PPO | Admitting: Family Medicine

## 2023-04-04 ENCOUNTER — Telehealth (HOSPITAL_COMMUNITY): Payer: Self-pay

## 2023-04-04 ENCOUNTER — Encounter: Payer: Self-pay | Admitting: Family Medicine

## 2023-04-04 ENCOUNTER — Other Ambulatory Visit: Payer: Self-pay

## 2023-04-04 VITALS — BP 146/72 | HR 88 | Temp 97.8°F | Wt 212.0 lb

## 2023-04-04 DIAGNOSIS — I10 Essential (primary) hypertension: Secondary | ICD-10-CM | POA: Diagnosis not present

## 2023-04-04 DIAGNOSIS — I63232 Cerebral infarction due to unspecified occlusion or stenosis of left carotid arteries: Secondary | ICD-10-CM

## 2023-04-04 DIAGNOSIS — M25472 Effusion, left ankle: Secondary | ICD-10-CM

## 2023-04-04 MED ORDER — METOPROLOL SUCCINATE ER 100 MG PO TB24
100.0000 mg | ORAL_TABLET | Freq: Every day | ORAL | 3 refills | Status: DC
  Filled 2023-04-04: qty 30, 30d supply, fill #1
  Filled 2023-04-04: qty 30, 30d supply, fill #0

## 2023-04-04 NOTE — Telephone Encounter (Signed)
He was seen this morning

## 2023-04-04 NOTE — Progress Notes (Signed)
Subjective:    Patient ID: Robert [name], male    DOB: 05-15-1941, 82 y.o.   MRN: [id number]  HPI Here to follow up on HTN. At our last visit on 03-14-23 his BP was well controlled, but he complained of ankle swelling. Therefore we stopped Amlodipine and instead gave him Metoprolol succinate 50 mg to take with his Losartan. Since then the ankle swelling has resolved, but his BP has been running high. He has been averaging 150's to 180's systolic. He feels fine. He has started PT and OT for stroke rehab.    Review of Systems  Constitutional: Negative.   Respiratory: Negative.    Cardiovascular: Negative.   Neurological: Negative.        Objective:   Physical Exam Constitutional:      Appearance: Normal appearance.  Cardiovascular:     Rate and Rhythm: Normal rate and regular rhythm.     Pulses: Normal pulses.     Heart sounds: Normal heart sounds.  Pulmonary:     Effort: Pulmonary effort is normal.     Breath sounds: Normal breath sounds.  Musculoskeletal:     Right lower leg: No edema.     Left lower leg: No edema.  Neurological:     Mental Status: He is alert and oriented to person, place, and time.           Assessment & Plan:  HTN. We will increase the Metoprolol succinate to 100 mg daily. Report back in one week. Gershon Crane, MD

## 2023-04-04 NOTE — Telephone Encounter (Signed)
Called regarding f/u, no answer, no vm. AB

## 2023-04-10 ENCOUNTER — Other Ambulatory Visit (HOSPITAL_COMMUNITY): Payer: Self-pay | Admitting: Neuroradiology

## 2023-04-10 ENCOUNTER — Encounter: Payer: Self-pay | Admitting: Diagnostic Neuroimaging

## 2023-04-10 ENCOUNTER — Ambulatory Visit: Payer: PPO | Admitting: Diagnostic Neuroimaging

## 2023-04-10 ENCOUNTER — Encounter: Payer: Self-pay | Admitting: Neurology

## 2023-04-10 VITALS — BP 156/67 | HR 66 | Ht 66.0 in | Wt 209.0 lb

## 2023-04-10 DIAGNOSIS — I63232 Cerebral infarction due to unspecified occlusion or stenosis of left carotid arteries: Secondary | ICD-10-CM | POA: Diagnosis not present

## 2023-04-10 DIAGNOSIS — I63512 Cerebral infarction due to unspecified occlusion or stenosis of left middle cerebral artery: Secondary | ICD-10-CM

## 2023-04-10 NOTE — Progress Notes (Unsigned)
[facility]  PATIENT: Robert [name] DOB: [birth year]  REFERRING CLINICIAN: Marvel Plan, MD HISTORY FROM: patient  REASON FOR VISIT: new consult    HISTORICAL  CHIEF COMPLAINT:  Chief Complaint  Patient presents with   New Patient (Initial Visit)    RM 6, here with wife Lea  Pt is here for hospital follow up after stroke. Pt states has been doing well from a stroke standpoint. Pt states he would like to know when can he go back to driving again.      HISTORY OF PRESENT ILLNESS:   82 year old male here for hospital stroke follow-up.  History of hypertension, hyperlipidemia, coronary artery disease, prostate cancer.  Presented to hospital on 03/02/2023 due to onset of aphasia.  Hyperdense left MCA sign was noted.  Patient was taken to neurointerventional radiology treated with carotid stenting and mechanical thrombectomy.  Patient has been on aspirin and Brilinta since that time.  Tolerating medications.  Symptoms essentially resolved.    REVIEW OF SYSTEMS: Full 14 system review of systems performed and negative with exception of: as per HPI.  ALLERGIES: No Known Allergies  HOME MEDICATIONS: Outpatient Medications Prior to Visit  Medication Sig Dispense Refill   albuterol (VENTOLIN HFA) 108 (90 Base) MCG/ACT inhaler Inhale 2 puffs into the lungs every 6 (six) hours as needed for wheezing or shortness of breath. 6.7 g 2   aspirin 81 MG tablet Take 81 mg by mouth daily.     atorvastatin (LIPITOR) 40 MG tablet Take 1 tablet (40 mg total) by mouth daily. 90 tablet 3   latanoprost (XALATAN) 0.005 % ophthalmic solution Place 1 drop into both eyes at bedtime.     losartan (COZAAR) 100 MG tablet Take 1 tablet (100 mg total) by mouth daily. 90 tablet 3   metoprolol succinate (TOPROL-XL) 100 MG 24 hr tablet Take 1 tablet (100 mg total) by mouth daily. Take with or immediately following a meal. 30 tablet 3   potassium chloride (KLOR-CON) 10 MEQ tablet Take 1  tablet (10 mEq total) by mouth daily. 90 tablet 1   tamsulosin (FLOMAX) 0.4 MG CAPS capsule Take 2 capsules (0.8 mg total) by mouth daily. 180 capsule 3   ticagrelor (BRILINTA) 90 MG TABS tablet Take 1 tablet (90 mg total) by mouth 2 (two) times daily. 180 tablet 0   No facility-administered medications prior to visit.    PAST MEDICAL HISTORY: Past Medical History:  Diagnosis Date   CAD (coronary artery disease)    per pt cardiologist dr Eden Emms, last office visit 02-05-2010, currently followed by pcp , dr fry   Diverticulosis of colon    ED (erectile dysfunction) of non-organic origin    History of colon polyps    02/ 2009 benign   Hyperlipidemia    Hypertension    Nodular prostate with lower urinary tract symptoms    Prostate cancer [facility]) urologist-  dr wrenn/  oncologist-  dr Kathrynn Running   dx 06-15-2016,  Stage T2a, Gleason 3+3,  PSA 4.64,  vol 23.4cc   S/P CABG x 4 05/02/2005   LIMA to LAD,  radial graft to CFx marginal,  SVG to diagonal and RCA   Wears glasses     PAST SURGICAL HISTORY: Past Surgical History:  Procedure Laterality Date   CARDIAC CATHETERIZATION  04-29-2005  dr w. Samule Ohm   severe 2V CAD-- 90% LAD diagonal, 80% CFx ostium,  50% pRCA   COLONOSCOPY  08/03/2007   per Dr. Russella Dar, benign polyp, he declines  to have any more    CORONARY ARTERY BYPASS GRAFT  05-02-2005  dr Zenaida Niece tright   LIMA to LAD,  radial graft to CFx marginal,  SVG to diagonal and RCA   EXICIOSN MASS INDEX FINGER Right 07/2015   per pt benign   IR CT HEAD LTD  03/02/2023   IR INTRAVSC STENT CERV CAROTID W/EMB-PROT MOD SED INCL ANGIO  03/02/2023   IR PERCUTANEOUS ART THROMBECTOMY/INFUSION INTRACRANIAL INC DIAG ANGIO  03/02/2023   IR US GUIDE VASC ACCESS RIGHT  03/02/2023   PROSTATE BIOPSY  06/15/2016   RADIOACTIVE SEED IMPLANT [name] 10/20/2016   Procedure: RADIOACTIVE SEED IMPLANT/BRACHYTHERAPY IMPLANT, 71 seeds implanted, no seeds found in bladder;  Surgeon: Bjorn Pippin, MD;  Location: [facility];  Service: Urology;  Laterality: [name];   RADIOLOGY WITH ANESTHESIA [name] 03/02/2023   Procedure: RADIOLOGY WITH ANESTHESIA;  Surgeon: Radiologist, Medication, MD;  Location: [facility] OR;  Service: Radiology;  Laterality: [name];    FAMILY HISTORY: Family History  Problem Relation Age of Onset   Sudden death Other    Cancer Maternal Aunt        ovarian   Cancer Maternal Uncle        prostate   Cancer Maternal Grandmother        breast   Cancer Maternal Grandfather        prostate   Cancer Maternal Uncle        prostate   Cancer Maternal Uncle        prostate   Cancer Maternal Uncle        prostate    SOCIAL HISTORY: Social History   Socioeconomic History   Marital status: Married    Spouse name: Not on file   Number of children: 2   Years of education: 4 years college   Highest education level: Bachelor's degree (e.g., BA, AB, BS)  Occupational History   Occupation: retired  Tobacco Use   Smoking status: Never   Smokeless tobacco: Never  Substance and Sexual Activity   Alcohol use: No    Alcohol/week: 0.0 standard drinks of alcohol   Drug use: No   Sexual activity: Not on file  Other Topics Concern   Not on file  Social History Narrative   HH 2   Married   2 children locally   3 grandchildren   Social Determinants of Health   Financial Resource Strain: Low Risk  (10/22/2021)   Overall Financial Resource Strain (CARDIA)    Difficulty of Paying Living Expenses: Not hard at all  Food Insecurity: No Food Insecurity (02/27/2023)   Hunger Vital Sign    Worried About Running Out of Food in the Last Year: Never true    Ran Out of Food in the Last Year: Never true  Transportation Needs: No Transportation Needs (02/27/2023)   PRAPARE - Administrator, Civil Service (Medical): No    Lack of Transportation (Non-Medical): No  Physical Activity: Sufficiently Active (10/22/2021)   Exercise Vital Sign    Days of Exercise per Week: 4 days    Minutes of Exercise per  Session: 40 min  Stress: No Stress Concern Present (10/22/2021)   [employer] of Occupational Health - Occupational Stress Questionnaire    Feeling of Stress : Not at all  Social Connections: Socially Integrated (10/22/2021)   Social Connection and Isolation Panel [NHANES]    Frequency of Communication with Friends and Family: Three times a week    Frequency of Social  Gatherings with Friends and Family: Three times a week    Attends Religious Services: More than 4 times per year    Active Member of Clubs or Organizations: Yes    Attends Banker Meetings: More than 4 times per year    Marital Status: Married  Catering manager Violence: Not At Risk (02/27/2023)   Humiliation, Afraid, Rape, and Kick questionnaire    Fear of Current or Ex-Partner: No    Emotionally Abused: No    Physically Abused: No    Sexually Abused: No     PHYSICAL EXAM  GENERAL EXAM/CONSTITUTIONAL: Vitals:  Vitals:   04/10/23 1008 04/10/23 1022  BP: (!) 168/74 (!) 156/67  Pulse: 68 66  Weight: 209 lb (94.8 kg)   Height: 5\' 6"  (1.676 m)    Body mass index is 33.73 kg/m. Wt Readings from Last 3 Encounters:  04/10/23 209 lb (94.8 kg)  04/04/23 212 lb (96.2 kg)  03/14/23 211 lb 6.4 oz (95.9 kg)   Patient is in no distress; well developed, nourished and groomed; neck is supple  CARDIOVASCULAR: Examination of carotid arteries is normal; no carotid bruits Regular rate and rhythm, no murmurs Examination of peripheral vascular system by observation and palpation is normal  EYES: Ophthalmoscopic exam of optic discs and posterior segments is normal; no papilledema or hemorrhages No results found.  MUSCULOSKELETAL: Gait, strength, tone, movements noted in Neurologic exam below  NEUROLOGIC: MENTAL STATUS:     03/13/2018    9:15 AM  MMSE - Mini Mental State Exam  Not completed: --   awake, alert, oriented to person, place and time recent and remote memory intact normal attention and  concentration language fluent, comprehension intact, naming intact fund of knowledge appropriate  CRANIAL NERVE:  2nd - no papilledema on fundoscopic exam 2nd, 3rd, 4th, 6th - pupils equal and reactive to light, visual fields full to confrontation, extraocular muscles intact, no nystagmus 5th - facial sensation symmetric 7th - facial strength symmetric 8th - hearing intact 9th - palate elevates symmetrically, uvula midline 11th - shoulder shrug symmetric 12th - tongue protrusion midline  MOTOR:  normal bulk and tone, full strength in the BUE, BLE; EXCEPT LEFT DELTOID LIMITED BY PAIN (prior injury)  SENSORY:  normal and symmetric to light touch, temperature, vibration  COORDINATION:  finger-nose-finger, fine finger movements normal  REFLEXES:  deep tendon reflexes 1+ and symmetric  GAIT/STATION:  narrow based gait; USING CANE     DIAGNOSTIC DATA (LABS, IMAGING, TESTING) - I reviewed patient records, labs, notes, testing and imaging myself where available.  Lab Results  Component Value Date   WBC 9.6 03/06/2023   HGB 11.1 (L) 03/06/2023   HCT 35.1 (L) 03/06/2023   MCV 97.8 03/06/2023   PLT 178 03/06/2023      Component Value Date/Time   NA 136 03/06/2023 0657   K 4.1 03/06/2023 0657   CL 104 03/06/2023 0657   CO2 26 03/06/2023 0657   GLUCOSE 131 (H) 03/06/2023 0657   BUN 16 03/06/2023 0657   CREATININE 0.75 03/06/2023 0657   CREATININE 0.91 04/06/2020 0851   CALCIUM 7.9 (L) 03/06/2023 0657   PROT 5.3 (L) 03/02/2023 1215   ALBUMIN 2.6 (L) 03/02/2023 1215   AST 27 03/02/2023 1215   ALT 32 03/02/2023 1215   ALKPHOS 63 03/02/2023 1215   BILITOT 0.5 03/02/2023 1215   GFRNONAA >60 03/06/2023 0657   GFRNONAA 81 04/06/2020 0851   GFRAA 94 04/06/2020 0851   Lab Results  Component  Value Date   CHOL 114 03/03/2023   HDL 17 (L) 03/03/2023   LDLCALC 46 03/03/2023   TRIG 86 03/06/2023   CHOLHDL 6.7 03/03/2023   Lab Results  Component Value Date   HGBA1C 6.6  (H) 03/03/2023   No results found for: "VITAMINB12" Lab Results  Component Value Date   TSH 3.54 05/02/2022       ASSESSMENT AND PLAN  82 y.o. year old male here with:    Dx:  1. Cerebrovascular accident (CVA) due to occlusion of left carotid artery [facility])       PLAN:  82 year old male with history of hypertension, CAD status post CABG x 4, history of prostate cancer, with recent admission for sepsis secondary to COVID-19 infection who presented to the [facility] ED with global aphasia, found to have occluded left ICA clot with blocked left M1 segment and left basal ganglia infarct.    Stroke - Acute left BG ischemic infarct with left ICA and M1 occlusion s/p IR with TICI3 and carotid stent, likely large vessel disease Code Stroke CT head: likely chronic infarct in the inferior left frontal lobe.  CTA head & neck: Occluded left ICA at bifurcation.  Occluded left M1 segment.  +/- Early infarct and left basal ganglia. CT perfusion: 13/70 S/p IR with TICI3 and reocclusion of carotid followed by carotid stenting MRI : 6 mm acute infarct at the junction of the left external capsule and left putamen.  Chronic cortically-based left MCA territory infarct as described.   2D Echo: LVEF 60 to 65%.  Left atrial size within normal limits. LDL 46 HgbA1c 6.6 VTE prophylaxis - SCDs aspirin 81 mg daily prior to admission, now on aspirin and Brilinta for carotid stenting for 3 months and then repeat carotid ultrasound with neurointerventional radiology clinic   Hypertension Home meds: Amlodipine 10, losartan 100 Stable on the high end Resume home BP meds Long term BP goal normotensive   Hyperlipidemia Home meds: Lipitor 40 mg, resumed in hospital LDL 46, goal < 70 Continue statin at discharge   Return for return to PCP.    Suanne Marker, MD 04/10/2023, 11:11 AM Certified in Neurology, Neurophysiology and Neuroimaging  [facility] [address] [place], [place] [address] [phone]

## 2023-04-10 NOTE — Patient Instructions (Signed)
-   aspirin and Brilinta for carotid stenting for 3 months and then repeat carotid ultrasound and follow up with interventional radiology clinic

## 2023-04-12 ENCOUNTER — Other Ambulatory Visit (HOSPITAL_COMMUNITY): Payer: Self-pay

## 2023-04-12 ENCOUNTER — Other Ambulatory Visit: Payer: Self-pay

## 2023-04-19 ENCOUNTER — Encounter: Payer: Self-pay | Admitting: Family Medicine

## 2023-04-20 ENCOUNTER — Other Ambulatory Visit: Payer: Self-pay | Admitting: Adult Health

## 2023-04-20 MED ORDER — HYDROCHLOROTHIAZIDE 25 MG PO TABS
25.0000 mg | ORAL_TABLET | Freq: Every day | ORAL | 0 refills | Status: DC
Start: 2023-04-20 — End: 2023-04-21

## 2023-04-20 NOTE — Telephone Encounter (Signed)
Spouse called to say Pt's BP has been running high and she does not think metoprolol succinate (TOPROL-XL) 100 MG 24 hr tablets are  sufficient.  Spouse is asking if MD could please increase dosage and send Rx ASAP, to the:  [facility] Phone: [phone]  Fax: [phone]     Spouse states she is very worried and does not want him to have another heart attack.  Spouse was informed MD has already left for the day. Spouse is asking if there is any one else that can help with this Rx request?

## 2023-04-21 ENCOUNTER — Other Ambulatory Visit: Payer: Self-pay

## 2023-04-21 ENCOUNTER — Other Ambulatory Visit (HOSPITAL_COMMUNITY): Payer: Self-pay

## 2023-04-21 MED ORDER — METOPROLOL SUCCINATE ER 100 MG PO TB24
100.0000 mg | ORAL_TABLET | Freq: Two times a day (BID) | ORAL | 2 refills | Status: DC
  Filled 2023-04-21 – 2023-04-25 (×2): qty 60, 30d supply, fill #0
  Filled 2023-05-23: qty 60, 30d supply, fill #1
  Filled 2023-06-26: qty 60, 30d supply, fill #2

## 2023-04-21 NOTE — Telephone Encounter (Signed)
Spoke with pt advised of Dr Clent Ridges recommendation. New Rx sent to pt pharmacy

## 2023-04-21 NOTE — Telephone Encounter (Signed)
I see Robert [name] sent in for #10 hydrochlorothiazide tablets. Tell him to stop this, and instead I will increase the Metoprolol succinate to 100 mg BID. Call in #60 if this with 2 rf

## 2023-04-25 ENCOUNTER — Other Ambulatory Visit (HOSPITAL_COMMUNITY): Payer: Self-pay

## 2023-04-27 ENCOUNTER — Other Ambulatory Visit (HOSPITAL_COMMUNITY): Payer: Self-pay

## 2023-05-01 ENCOUNTER — Telehealth: Payer: Self-pay | Admitting: Family Medicine

## 2023-05-01 NOTE — Telephone Encounter (Signed)
Triage called to inform MD that Pt was advised to see PCP within 24 hrs.  Pt did not want to come in for another OV, but wanted to ask MD if he would  adjust one or the two BP medications Pt is currently taking?  As per Triage (who was told by Pt): Yesterday's BP = 212/98 Today's BP = 179/80  Please advise.

## 2023-05-01 NOTE — Telephone Encounter (Signed)
Pt's wife is calling back to discuss husband's blood pressure. She states blood pressure got over 200/99 over the weekend. She was hoping to get a call back before the end of the day. I did transfer her to triage nurse to discuss pt's blood pressure readings further.

## 2023-05-01 NOTE — Telephone Encounter (Signed)
Requesting a call regarding her husband's elevated blood pressure

## 2023-05-02 ENCOUNTER — Other Ambulatory Visit (HOSPITAL_COMMUNITY): Payer: Self-pay

## 2023-05-02 ENCOUNTER — Other Ambulatory Visit: Payer: Self-pay

## 2023-05-02 ENCOUNTER — Ambulatory Visit (INDEPENDENT_AMBULATORY_CARE_PROVIDER_SITE_OTHER): Payer: PPO | Admitting: Family Medicine

## 2023-05-02 VITALS — BP 142/80 | HR 54 | Temp 98.0°F | Wt 213.0 lb

## 2023-05-02 DIAGNOSIS — I10 Essential (primary) hypertension: Secondary | ICD-10-CM

## 2023-05-02 MED ORDER — HYDROCHLOROTHIAZIDE 25 MG PO TABS
25.0000 mg | ORAL_TABLET | Freq: Every morning | ORAL | 3 refills | Status: DC
  Filled 2023-05-02: qty 30, 30d supply, fill #0
  Filled 2023-05-02: qty 100, 100d supply, fill #0

## 2023-05-02 NOTE — Telephone Encounter (Signed)
He developed the ankle swelling on the high dose (10 mg) of Robert [name], so let's try the low dose again. Add Robert [name] 5 mg daily to what he is taking now. Call in #30 with 2 rf

## 2023-05-02 NOTE — Progress Notes (Signed)
   Subjective:    Patient ID: Robert [name], male    DOB: [birth year], 82 y.o.   MRN: [id number]  HPI Here to follow up on HTN. He has been taking Metoprolol succinate 100 mg BID and Losartan 100 mg every morning. He feels well, but he has developed more ankle swelling. No SOB. His BP readings have been averaging in the 190's over 80's.    Review of Systems  Constitutional: Negative.   Respiratory: Negative.    Cardiovascular:  Positive for leg swelling. Negative for chest pain and palpitations.       Objective:   Physical Exam Constitutional:      Appearance: Normal appearance.  Cardiovascular:     Rate and Rhythm: Normal rate and regular rhythm.     Pulses: Normal pulses.     Heart sounds: Normal heart sounds.  Pulmonary:     Effort: Pulmonary effort is normal.     Breath sounds: Normal breath sounds.  Musculoskeletal:     Comments: 2+ edema in both ankles   Neurological:     Mental Status: He is alert.           Assessment & Plan:  His HTN is not well controlled. We will add hydrochlorothiazide 25 mg every morning to his current regimen. Recheck in 2 weeks for an exam and for labs (BMET).  Gershon Crane, MD

## 2023-05-03 NOTE — Telephone Encounter (Signed)
Pt had a visit with Dr Clent Ridges on 05/02/23 and Dr Clent Ridges reviewed the BP medication changes with pt

## 2023-05-11 ENCOUNTER — Ambulatory Visit (HOSPITAL_COMMUNITY)
Admission: RE | Admit: 2023-05-11 | Discharge: 2023-05-11 | Disposition: A | Discharge status: Home / Self Care | Payer: PPO | Source: Ambulatory Visit | Attending: Neuroradiology | Admitting: Neuroradiology

## 2023-05-11 ENCOUNTER — Ambulatory Visit (HOSPITAL_COMMUNITY)
Admission: RE | Admit: 2023-05-11 | Discharge: 2023-05-11 | Disposition: A | Discharge status: Home / Self Care | Payer: PPO | Source: Ambulatory Visit | Attending: Student | Admitting: Student

## 2023-05-11 DIAGNOSIS — I63512 Cerebral infarction due to unspecified occlusion or stenosis of left middle cerebral artery: Secondary | ICD-10-CM

## 2023-05-11 NOTE — Progress Notes (Signed)
Referring Physician(s): Matthews,Kacie Sue-Ellen  Chief Complaint: The patient is seen in follow up today s/p Left M1/MCA mechanical thrombectomy and cervical left ICA stenting.  History of present illness:  82 year old male presenting to the emergency room with receptive and expressive aphasia; NIHSS 4. His last known well was 9 p.m. on 03/01/2023. This past medical history significant for hypertension, hyperlipidemia, CAD s/p CABG, history of prostate cancer, with recent admission for sepsis related to COVID-19 infection, discharged on 03/01/23. Premorbid modified Rankin scale 0. Initial head CT showed hyperdense left MCA and chronic ischemic findings. Repeat head CT showed hypodensity within the left basal ganglia. CT angiogram of the head and neck showed an occlusion of the left internal carotid artery at the bifurcation in the neck with reconstitution at the cavernous segment and occlusion of the distal left M1/MCA segment. He underwent emergent mechanical thrombectomy and cervical left internal carotid artery stenting and angioplasty.Fortunately, Robert [name] have made a great recovery and is back to his baseline prior to stroke. He underwent carotid duplex today and comes to discuss findings. He has no new complaints.   Past Medical History:  Diagnosis Date   CAD (coronary artery disease)    per pt cardiologist dr Eden Emms, last office visit 02-05-2010, currently followed by pcp , dr fry   Diverticulosis of colon    ED (erectile dysfunction) of non-organic origin    History of colon polyps    02/ 2009 benign   Hyperlipidemia    Hypertension    Nodular prostate with lower urinary tract symptoms    Prostate cancer [facility]) urologist-  dr wrenn/  oncologist-  dr Kathrynn Running   dx 06-15-2016,  Stage T2a, Gleason 3+3,  PSA 4.64,  vol 23.4cc   S/P CABG x 4 05/02/2005   LIMA to LAD,  radial graft to CFx marginal,  SVG to diagonal and RCA   Wears glasses     Past Surgical History:  Procedure  Laterality Date   CARDIAC CATHETERIZATION  04-29-2005  dr w. Samule Ohm   severe 2V CAD-- 90% LAD diagonal, 80% CFx ostium,  50% pRCA   COLONOSCOPY  08/03/2007   per Dr. Russella Dar, benign polyp, he declines to have any more    CORONARY ARTERY BYPASS GRAFT  05-02-2005  dr Zenaida Niece tright   LIMA to LAD,  radial graft to CFx marginal,  SVG to diagonal and RCA   EXICIOSN MASS INDEX FINGER Right 07/2015   per pt benign   IR CT HEAD LTD  03/02/2023   IR INTRAVSC STENT CERV CAROTID W/EMB-PROT MOD SED INCL ANGIO  03/02/2023   IR PERCUTANEOUS ART THROMBECTOMY/INFUSION INTRACRANIAL INC DIAG ANGIO  03/02/2023   IR US GUIDE VASC ACCESS RIGHT  03/02/2023   PROSTATE BIOPSY  06/15/2016   RADIOACTIVE SEED IMPLANT [name] 10/20/2016   Procedure: RADIOACTIVE SEED IMPLANT/BRACHYTHERAPY IMPLANT, 71 seeds implanted, no seeds found in bladder;  Surgeon: Bjorn Pippin, MD;  Location: [facility];  Service: Urology;  Laterality: [name];   RADIOLOGY WITH ANESTHESIA [name] 03/02/2023   Procedure: RADIOLOGY WITH ANESTHESIA;  Surgeon: Radiologist, Medication, MD;  Location: [facility] OR;  Service: Radiology;  Laterality: [name];    Allergies: Patient has no known allergies.  Medications: Prior to Admission medications   Medication Sig Start Date End Date Taking? Authorizing Provider  albuterol (VENTOLIN HFA) 108 (90 Base) MCG/ACT inhaler Inhale 2 puffs into the lungs every 6 (six) hours as needed for wheezing or shortness of breath. 03/01/23   Miguel Rota, MD  aspirin  81 MG tablet Take 81 mg by mouth daily.    [provider]  atorvastatin (LIPITOR) 40 MG tablet Take 1 tablet (40 mg total) by mouth daily. 03/14/23   Nelwyn Salisbury, MD  hydrochlorothiazide (HYDRODIURIL) 25 MG tablet Take 1 tablet (25 mg total) by mouth every morning. 05/02/23   Nelwyn Salisbury, MD  latanoprost (XALATAN) 0.005 % ophthalmic solution Place 1 drop into both eyes at bedtime. 01/22/23   [provider]  losartan (COZAAR) 100 MG tablet Take 1  tablet (100 mg total) by mouth daily. 01/12/23   Nelwyn Salisbury, MD  metoprolol succinate (TOPROL-XL) 100 MG 24 hr tablet Take 1 tablet (100 mg total) by mouth 2 (two) times daily. Take with or immediately following a meal. 04/21/23   Nelwyn Salisbury, MD  potassium chloride (KLOR-CON) 10 MEQ tablet Take 1 tablet (10 mEq total) by mouth daily. 10/17/22   Nelwyn Salisbury, MD  tamsulosin (FLOMAX) 0.4 MG CAPS capsule Take 2 capsules (0.8 mg total) by mouth daily. 03/14/23   Nelwyn Salisbury, MD  ticagrelor (BRILINTA) 90 MG TABS tablet Take 1 tablet (90 mg total) by mouth 2 (two) times daily. 03/06/23 06/04/23  Tomie China, MD     Family History  Problem Relation Age of Onset   Sudden death Other    Cancer Maternal Aunt        ovarian   Cancer Maternal Uncle        prostate   Cancer Maternal Grandmother        breast   Cancer Maternal Grandfather        prostate   Cancer Maternal Uncle        prostate   Cancer Maternal Uncle        prostate   Cancer Maternal Uncle        prostate    Social History   Socioeconomic History   Marital status: Married    Spouse name: Not on file   Number of children: 2   Years of education: 4 years college   Highest education level: Bachelor's degree (e.g., BA, AB, BS)  Occupational History   Occupation: retired  Tobacco Use   Smoking status: Never   Smokeless tobacco: Never  Substance and Sexual Activity   Alcohol use: No    Alcohol/week: 0.0 standard drinks of alcohol   Drug use: No   Sexual activity: Not on file  Other Topics Concern   Not on file  Social History Narrative   HH 2   Married   2 children locally   3 grandchildren   Social Determinants of Health   Financial Resource Strain: Low Risk  (05/02/2023)   Overall Financial Resource Strain (CARDIA)    Difficulty of Paying Living Expenses: Not hard at all  Food Insecurity: No Food Insecurity (05/02/2023)   Hunger Vital Sign    Worried About Running Out of Food in the Last Year:  Never true    Ran Out of Food in the Last Year: Never true  Transportation Needs: No Transportation Needs (05/02/2023)   PRAPARE - Administrator, Civil Service (Medical): No    Lack of Transportation (Non-Medical): No  Physical Activity: Insufficiently Active (05/02/2023)   Exercise Vital Sign    Days of Exercise per Week: 3 days    Minutes of Exercise per Session: 40 min  Stress: Stress Concern Present (05/02/2023)   [employer] of Occupational Health - Occupational Stress Questionnaire  Feeling of Stress : To some extent  Social Connections: Socially Integrated (05/02/2023)   Social Connection and Isolation Panel [NHANES]    Frequency of Communication with Friends and Family: Three times a week    Frequency of Social Gatherings with Friends and Family: Once a week    Attends Religious Services: More than 4 times per year    Active Member of [employer] or Organizations: Yes    Attends Engineer, structural: More than 4 times per year    Marital Status: Married     Vital Signs: There were no vitals taken for this visit.  Physical Exam Constitutional:      Appearance: He is obese.  HENT:     Head: Normocephalic and atraumatic.     Mouth/Throat:     Mouth: Mucous membranes are moist.     Pharynx: Oropharynx is clear.  Eyes:     Extraocular Movements: Extraocular movements intact.     Conjunctiva/sclera: Conjunctivae normal.     Pupils: Pupils are equal, round, and reactive to light.  Pulmonary:     Effort: Pulmonary effort is normal.  Neurological:     Mental Status: He is alert and oriented to person, place, and time. Mental status is at baseline.     Cranial Nerves: Cranial nerves 2-12 are intact.     Sensory: Sensation is intact.     Motor: Motor function is intact.     Coordination: Coordination is intact.     Imaging: VAS US CAROTID  Result Date: 05/11/2023 Carotid Arterial Duplex Study Patient Name:  Robert [name]  Date of Exam:    05/11/2023 Medical Rec #: [id number]          Accession #:    [id number] Date of Birth: [birth year]          Patient Gender: M Patient Age:   33 years Exam Location:  [facility] Procedure:      VAS US CAROTID Referring Phys: Jerilynn Mages DE MACEDO RODRIGUES --------------------------------------------------------------------------------  Indications:   Left stent. 03/02/23 Risk Factors:  Hypertension, hyperlipidemia, Diabetes, coronary artery disease,                prior CVA. Other Factors: Prostate cancer. Performing Technologist: Jeb Levering RDMS, RVT  Examination Guidelines: A complete evaluation includes B-mode imaging, spectral Doppler, color Doppler, and power Doppler as needed of all accessible portions of each vessel. Bilateral testing is considered an integral part of a complete examination. Limited examinations for reoccurring indications may be performed as noted.  Right Carotid Findings: +----------+--------+--------+--------+-------------------------+--------+           PSV cm/sEDV cm/sStenosisPlaque Description       Comments +----------+--------+--------+--------+-------------------------+--------+ CCA Prox  100     8                                                 +----------+--------+--------+--------+-------------------------+--------+ CCA Distal50      12                                                +----------+--------+--------+--------+-------------------------+--------+ ICA Prox  40      4       1-39%   calcific                          +----------+--------+--------+--------+-------------------------+--------+  ICA Distal114     14                                                +----------+--------+--------+--------+-------------------------+--------+ ECA       403     23      >50%    heterogenous and calcific         +----------+--------+--------+--------+-------------------------+--------+  +----------+--------+-------+----------------+-------------------+           PSV cm/sEDV cmsDescribe        Arm Pressure (mmHG) +----------+--------+-------+----------------+-------------------+ [id number]            Multiphasic, WNL                    +----------+--------+-------+----------------+-------------------+ +---------+--------+--+--------+-+---------+ VertebralPSV cm/s62EDV cm/s9Antegrade +---------+--------+--+--------+-+---------+  Left Carotid Findings: +--------+--------+--------+--------+------------------+--------+         PSV cm/sEDV cm/sStenosisPlaque DescriptionComments +--------+--------+--------+--------+------------------+--------+ CCA Prox93      12                                         +--------+--------+--------+--------+------------------+--------+ ECA     146     13                                         +--------+--------+--------+--------+------------------+--------+ +----------+--------+--------+----------------+-------------------+           PSV cm/sEDV cm/sDescribe        Arm Pressure (mmHG) +----------+--------+--------+----------------+-------------------+ [id number]             Multiphasic, WNL                    +----------+--------+--------+----------------+-------------------+ +---------+--------+--+--------+-+---------+ VertebralPSV cm/s40EDV cm/s6Antegrade +---------+--------+--+--------+-+---------+  Left Stent(s): +---------------+--+--++++ Prox to Stent  677  +---------------+--+--++++ Proximal Stent 5711 +---------------+--+--++++ Mid Stent      7814 +---------------+--+--++++ Distal Stent   5810 +---------------+--+--++++ Distal to [id number] +---------------+--+--++++    Summary: Right Carotid: Velocities in the right ICA are consistent with a 1-39% stenosis. Left Carotid: Left stent no evidence of stenosis. Vertebrals:  Bilateral vertebral arteries demonstrate  antegrade flow. Subclavians: Normal flow hemodynamics were seen in bilateral subclavian              arteries. *See table(s) above for measurements and observations.     Preliminary     Labs:  CBC: Recent Labs    02/28/23 0633 03/02/23 1041 03/02/23 1047 03/02/23 1952 03/03/23 0721 03/06/23 0657  WBC 6.9 9.9  --   --  10.4 9.6  HGB 14.9 13.9 14.6 12.2* 12.7* 11.1*  HCT 45.9 43.0 43.0 36.0* 38.3* 35.1*  PLT 143* 249  --   --  306 178    COAGS: Recent Labs    02/27/23 1409 02/27/23 1411 02/28/23 0633 03/02/23 1215  INR 1.1  --  1.0 1.1  APTT  --  33 23* 26    BMP: Recent Labs    03/02/23 1215 03/02/23 1952 03/03/23 0721 03/04/23 0805 03/06/23 0657  NA 137 139 138 140 136  K 3.9 3.9 3.8 3.9 4.1  CL 104  --  109 109 104  CO2 27  --  22 25 26   GLUCOSE 132*  --  166* 91 131*  BUN  34*  --  28* 22 16  CALCIUM 8.3*  --  7.9* 8.1* 7.9*  CREATININE 0.95  --  0.91 0.90 0.75  GFRNONAA >60  --  >60 >60 >60    LIVER FUNCTION TESTS: Recent Labs    02/27/23 1409 03/02/23 1215  BILITOT 1.1 0.5  AST 26 27  ALT 31 32  ALKPHOS 86 63  PROT 7.1 5.3*  ALBUMIN 3.3* 2.6*    Assessment:  Robert [name] is doing well after recovering from a stroke in September this year and is now back to his baseline.  His carotid ultrasound today is reassuring, showing widely patent left ICA stent and no significant stenosis of his right carotid artery.  He may discontinue the Brilinta from a neurointerventional standpoint.  He was advised to continue on aspirin 81 mg/day.  Annual carotid ultrasound is also recommend.  No further follow-up with neuro intervention needed at this time.    Signed: Baldemar Lenis, MD 05/11/2023, 12:27 PM    I spent a total of    25 Minutes in face to face in clinical consultation, greater than 50% of which was counseling/coordinating care for carotid artery disease, stroke.

## 2023-05-18 ENCOUNTER — Ambulatory Visit: Payer: PPO | Admitting: Family Medicine

## 2023-05-18 ENCOUNTER — Other Ambulatory Visit: Payer: Self-pay

## 2023-05-18 ENCOUNTER — Other Ambulatory Visit (HOSPITAL_COMMUNITY): Payer: Self-pay

## 2023-05-18 VITALS — BP 118/62 | HR 73 | Temp 97.9°F | Wt 209.0 lb

## 2023-05-18 DIAGNOSIS — I10 Essential (primary) hypertension: Secondary | ICD-10-CM

## 2023-05-18 MED ORDER — HYDROCHLOROTHIAZIDE 25 MG PO TABS
25.0000 mg | ORAL_TABLET | Freq: Every morning | ORAL | 3 refills | Status: DC
  Filled 2023-05-18 – 2023-07-26 (×4): qty 90, 90d supply, fill #0

## 2023-05-18 MED ORDER — TAMSULOSIN HCL 0.4 MG PO CAPS: 0.4000 mg | ORAL_CAPSULE | Freq: Every day | ORAL | Status: DC

## 2023-05-18 NOTE — Progress Notes (Signed)
   Subjective:    Patient ID: Robert [name], male    DOB: [birth year], 82 y.o.   MRN: [id number]  HPI Here to follow up on HTN. At our last visit we added hydrochlorothiazide to his medications, and his BP has been improved. He has averaged readings of 130's to 140's systolic at home, and he feels good.    Review of Systems  Constitutional: Negative.   Respiratory: Negative.    Cardiovascular: Negative.        Objective:   Physical Exam Constitutional:      Appearance: Normal appearance.  Cardiovascular:     Rate and Rhythm: Normal rate and regular rhythm.     Pulses: Normal pulses.     Heart sounds: Normal heart sounds.  Pulmonary:     Effort: Pulmonary effort is normal.     Breath sounds: Normal breath sounds.  Neurological:     Mental Status: He is alert.           Assessment & Plan:  His HTN is now well controlled. He wil return in 3 months for a well exam.  Gershon Crane, MD

## 2023-05-23 ENCOUNTER — Other Ambulatory Visit (HOSPITAL_COMMUNITY): Payer: Self-pay

## 2023-05-23 ENCOUNTER — Other Ambulatory Visit: Payer: Self-pay

## 2023-05-23 ENCOUNTER — Other Ambulatory Visit: Payer: Self-pay | Admitting: Family Medicine

## 2023-05-24 ENCOUNTER — Other Ambulatory Visit: Payer: Self-pay

## 2023-05-24 ENCOUNTER — Other Ambulatory Visit (HOSPITAL_COMMUNITY): Payer: Self-pay

## 2023-05-24 NOTE — Patient Outreach (Signed)
 Telephone outreach to patient to obtain mRS was successfully completed. MRS= 1  [facility] Care Management Assistant [phone]

## 2023-05-25 ENCOUNTER — Other Ambulatory Visit: Payer: Self-pay

## 2023-05-25 ENCOUNTER — Other Ambulatory Visit (HOSPITAL_COMMUNITY): Payer: Self-pay

## 2023-05-25 MED ORDER — POTASSIUM CHLORIDE ER 10 MEQ PO TBCR
10.0000 meq | EXTENDED_RELEASE_TABLET | Freq: Every day | ORAL | 1 refills | Status: DC
  Filled 2023-05-25: qty 90, 90d supply, fill #0
  Filled 2023-08-24: qty 90, 90d supply, fill #1

## 2023-06-21 DIAGNOSIS — H40053 Ocular hypertension, bilateral: Secondary | ICD-10-CM | POA: Diagnosis not present

## 2023-06-26 ENCOUNTER — Other Ambulatory Visit: Payer: Self-pay

## 2023-07-23 ENCOUNTER — Other Ambulatory Visit: Payer: Self-pay | Admitting: Family Medicine

## 2023-07-24 ENCOUNTER — Other Ambulatory Visit: Payer: Self-pay

## 2023-07-24 ENCOUNTER — Other Ambulatory Visit (HOSPITAL_COMMUNITY): Payer: Self-pay

## 2023-07-24 MED ORDER — METOPROLOL SUCCINATE ER 100 MG PO TB24
100.0000 mg | ORAL_TABLET | Freq: Two times a day (BID) | ORAL | 2 refills | Status: DC
  Filled 2023-07-24: qty 60, 30d supply, fill #0
  Filled 2023-08-24: qty 60, 30d supply, fill #1

## 2023-07-27 ENCOUNTER — Other Ambulatory Visit (HOSPITAL_COMMUNITY): Payer: Self-pay

## 2023-08-24 ENCOUNTER — Other Ambulatory Visit (HOSPITAL_COMMUNITY): Payer: Self-pay

## 2023-08-29 ENCOUNTER — Encounter: Payer: PPO | Admitting: Family Medicine

## 2023-08-30 ENCOUNTER — Ambulatory Visit: Admitting: Orthopaedic Surgery

## 2023-09-01 ENCOUNTER — Telehealth: Payer: Self-pay | Admitting: Family Medicine

## 2023-09-01 NOTE — Telephone Encounter (Signed)
 Copied from CRM [id number]. Topic: General - Deceased Patient >> Sep 05, 2023 11:28 AM Ernst Spell wrote: Name of caller: Mardene Celeste     Name of funeral home: [employer]  Phone number of funeral home: [phone]  Provider that needs to sign form: Dr. Gershon Crane  Timeline for signing: submitted death cert on the 09/03/23, by law needs to be completed in (3) days. It's currently pending in the [facility].  -

## 2023-09-04 NOTE — Telephone Encounter (Signed)
 Called pt wife per Dr Clent Ridges request to get some information on on pt place, date and time of death. Pt wife provided information which was given to Dr Clent Ridges for pt death Certificate completing.

## 2023-09-05 DEATH — deceased

## 2023-10-26 ENCOUNTER — Encounter: Admitting: Family Medicine

## 2023-12-14 ENCOUNTER — Ambulatory Visit: Admitting: Family Medicine
